# Patient Record
Sex: Male | Born: 1960 | Race: White | Hispanic: No | Marital: Married | Smoking: Former smoker
Health system: Southern US, Community
[De-identification: ages and names within clinical notes are randomized; demographics above are authoritative.]

## PROBLEM LIST (undated history)

## (undated) DIAGNOSIS — Z9889 Other specified postprocedural states: Secondary | ICD-10-CM

## (undated) DIAGNOSIS — I272 Pulmonary hypertension, unspecified: Secondary | ICD-10-CM

## (undated) DIAGNOSIS — I428 Other cardiomyopathies: Secondary | ICD-10-CM

## (undated) DIAGNOSIS — J302 Other seasonal allergic rhinitis: Secondary | ICD-10-CM

## (undated) DIAGNOSIS — F17200 Nicotine dependence, unspecified, uncomplicated: Secondary | ICD-10-CM

## (undated) DIAGNOSIS — I509 Heart failure, unspecified: Secondary | ICD-10-CM

## (undated) DIAGNOSIS — I10 Essential (primary) hypertension: Secondary | ICD-10-CM

## (undated) DIAGNOSIS — R0602 Shortness of breath: Secondary | ICD-10-CM

## (undated) HISTORY — DX: Other cardiomyopathies: I42.8

## (undated) HISTORY — DX: Other seasonal allergic rhinitis: J30.2

## (undated) HISTORY — DX: Shortness of breath: R06.02

## (undated) HISTORY — DX: Heart failure, unspecified: I50.9

## (undated) HISTORY — DX: Other specified postprocedural states: Z98.890

## (undated) HISTORY — DX: Pulmonary hypertension, unspecified: I27.20

## (undated) HISTORY — PX: OTHER SURGICAL HISTORY: SHX169

## (undated) HISTORY — DX: Essential (primary) hypertension: I10

## (undated) HISTORY — DX: Nicotine dependence, unspecified, uncomplicated: F17.200

---

## 2011-11-13 ENCOUNTER — Encounter: Payer: Self-pay | Admitting: Cardiology

## 2011-11-13 ENCOUNTER — Other Ambulatory Visit: Payer: Self-pay | Admitting: Cardiology

## 2011-11-13 DIAGNOSIS — I509 Heart failure, unspecified: Secondary | ICD-10-CM

## 2011-11-20 ENCOUNTER — Encounter: Payer: Self-pay | Admitting: Cardiology

## 2011-11-20 ENCOUNTER — Ambulatory Visit (INDEPENDENT_AMBULATORY_CARE_PROVIDER_SITE_OTHER): Payer: Self-pay | Admitting: Cardiology

## 2011-11-20 ENCOUNTER — Encounter: Payer: Self-pay | Admitting: *Deleted

## 2011-11-20 VITALS — BP 152/78 | HR 80 | Ht 70.0 in | Wt 189.0 lb

## 2011-11-20 DIAGNOSIS — I2789 Other specified pulmonary heart diseases: Secondary | ICD-10-CM

## 2011-11-20 DIAGNOSIS — I5023 Acute on chronic systolic (congestive) heart failure: Secondary | ICD-10-CM

## 2011-11-20 DIAGNOSIS — I519 Heart disease, unspecified: Secondary | ICD-10-CM

## 2011-11-20 DIAGNOSIS — S2691XA Contusion of heart, unspecified with or without hemopericardium, initial encounter: Secondary | ICD-10-CM

## 2011-11-20 DIAGNOSIS — I272 Pulmonary hypertension, unspecified: Secondary | ICD-10-CM

## 2011-11-20 MED ORDER — FUROSEMIDE 40 MG PO TABS: 40.0000 mg | ORAL_TABLET | Freq: Every day | ORAL | Status: DC

## 2011-11-20 MED ORDER — LISINOPRIL 10 MG PO TABS: 10.0000 mg | ORAL_TABLET | Freq: Every day | ORAL | Status: DC

## 2011-11-20 MED ORDER — SPIRONOLACTONE 25 MG PO TABS: 25.0000 mg | ORAL_TABLET | Freq: Every day | ORAL | Status: DC

## 2011-11-20 NOTE — Patient Instructions (Signed)
   Lasix 80mg  daily x 5 days, then 40mg  daily thereafter  Aldactone 25mg  daily  Lisinopril 10mg  daily Your physician recommends that you go to the [facility] lab for blood work next Wednesday, 5/22 for BMET & BNP.   Nurse visit for vitals next Thursday Lexiscan cardiolite stress test  Follow up in  1 month

## 2011-11-21 ENCOUNTER — Telehealth: Payer: Self-pay | Admitting: *Deleted

## 2011-11-21 ENCOUNTER — Other Ambulatory Visit: Payer: Self-pay | Admitting: Cardiology

## 2011-11-21 DIAGNOSIS — I5023 Acute on chronic systolic (congestive) heart failure: Secondary | ICD-10-CM

## 2011-11-21 NOTE — Telephone Encounter (Signed)
Redness in face after taking medications

## 2011-11-22 ENCOUNTER — Encounter: Payer: Self-pay | Admitting: Nurse Practitioner

## 2011-11-22 DIAGNOSIS — I5023 Acute on chronic systolic (congestive) heart failure: Secondary | ICD-10-CM

## 2011-11-22 NOTE — Telephone Encounter (Signed)
Left message to return call 

## 2011-11-22 NOTE — Telephone Encounter (Signed)
11/22/11 152/91   89 pulse  11/21/11  146/88  86 pulse  Patient came in with blood pressure

## 2011-11-27 ENCOUNTER — Encounter: Payer: Self-pay | Admitting: Nurse Practitioner

## 2011-11-27 NOTE — Telephone Encounter (Signed)
Spoke with patient on Monday evening.  Patient restarted the two medicines again & continues to have the redness, flushing in face after taking meds.  Advised to d/c one medication at a time to see if makes a difference as he did begin two different meds at the same time.  States he does have nurse visit this Thursday morning & will follow up then.

## 2011-11-28 ENCOUNTER — Ambulatory Visit (INDEPENDENT_AMBULATORY_CARE_PROVIDER_SITE_OTHER): Payer: Self-pay | Admitting: *Deleted

## 2011-11-28 ENCOUNTER — Encounter: Payer: Self-pay | Admitting: *Deleted

## 2011-11-28 VITALS — BP 144/74 | HR 73 | Ht 70.0 in | Wt 190.0 lb

## 2011-11-28 DIAGNOSIS — I10 Essential (primary) hypertension: Secondary | ICD-10-CM

## 2011-11-28 NOTE — Progress Notes (Signed)
Patient presents to office for nurse visit requested at last office visit after starting new medications for blood pressure elevation. Patient has taken all doses of medications. Denies dizziness, chest pain or shortness of breath. After starting meds, patient initially had some flushing of the face that has now cleared up and no longer experiencing.

## 2011-12-03 ENCOUNTER — Telehealth: Payer: Self-pay | Admitting: *Deleted

## 2011-12-03 DIAGNOSIS — I5023 Acute on chronic systolic (congestive) heart failure: Secondary | ICD-10-CM

## 2011-12-03 DIAGNOSIS — Z79899 Other long term (current) drug therapy: Secondary | ICD-10-CM

## 2011-12-03 NOTE — Telephone Encounter (Signed)
Patient wants to know if okay to stay at 80mg  of Lasix daily.  Initially had him taking 80mg  x 5 days, then back to 40mg  daily.  States when he dropped down to 40mg  daily, BP went back up & began to feel more winded again.  OV scheduled for 6/18.

## 2011-12-04 NOTE — Telephone Encounter (Signed)
The patient can continue on 80 mg of Lasix daily.  He should have an appointment soon in the office.  Also schedule a BMET couple of days prior to his office visit

## 2011-12-05 MED ORDER — FUROSEMIDE 80 MG PO TABS: 80.0000 mg | ORAL_TABLET | Freq: Every day | ORAL | Status: DC

## 2011-12-05 NOTE — Telephone Encounter (Signed)
Patient notified and verbalized understanding.  New rx sent to pharmacy for 80mg  tabs.  OV scheduled for 6/18.  Will fax order to [facility] lab - he will do prior to OV.

## 2011-12-07 DIAGNOSIS — Z9889 Other specified postprocedural states: Secondary | ICD-10-CM

## 2011-12-07 HISTORY — PX: CARDIAC CATHETERIZATION: SHX172

## 2011-12-07 HISTORY — DX: Other specified postprocedural states: Z98.890

## 2011-12-09 ENCOUNTER — Telehealth: Payer: Self-pay | Admitting: Physician Assistant

## 2011-12-09 NOTE — Telephone Encounter (Signed)
Patient called because of elevated blood pressures. He was recently seen in the office by Dr. Andee Lineman at which time his BP was in the 140's but he states since then he intermittently runs 180s systolic. Otherwise he feels fine, no CP, SOB, HA or visual changes. He was wondering if he should increase his medicines. I told him for tonight it is okay to go ahead and take an additional lisinopril, but instructed him to call back tomorrow when office is open to discuss plans to either a) permanently change to increased dose of lisinopril with sooner lab recheck or b) add additional antihypertensive agent. He doesn't see Dr. Andee Lineman until June 18th. As a general precaution I also advised if he begins to feel poorly with said elevated BP he is welcome to proceed to ER for evaluation although I do not feel he needs to at present given no current symptoms and plans to take extra lisinopril. He verbalized understanding and gratitude.  Zenovia Justman PA-C

## 2011-12-10 ENCOUNTER — Telehealth: Payer: Self-pay | Admitting: Cardiology

## 2011-12-10 NOTE — Telephone Encounter (Signed)
See below

## 2011-12-10 NOTE — Telephone Encounter (Signed)
Patient had to call last evening due to elevated Blood pressure. Please see telephone # from Bernette Mayers. PA. Patient is concerned about his blood pressure.

## 2011-12-11 NOTE — Telephone Encounter (Signed)
Discussed below with patient.  States he feels like he gets more SOB when blood pressure goes up.  He questions taking 20mg  Lisinopril instead of the 10mg  daily.  Is due for follow up 6/18.

## 2011-12-11 NOTE — Telephone Encounter (Signed)
Patient has called back.

## 2011-12-14 DIAGNOSIS — I519 Heart disease, unspecified: Secondary | ICD-10-CM | POA: Insufficient documentation

## 2011-12-14 DIAGNOSIS — I272 Pulmonary hypertension, unspecified: Secondary | ICD-10-CM | POA: Insufficient documentation

## 2011-12-14 DIAGNOSIS — I279 Pulmonary heart disease, unspecified: Secondary | ICD-10-CM | POA: Insufficient documentation

## 2011-12-14 DIAGNOSIS — S2691XA Contusion of heart, unspecified with or without hemopericardium, initial encounter: Secondary | ICD-10-CM | POA: Insufficient documentation
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
## 2011-12-14 NOTE — Assessment & Plan Note (Signed)
Likely secondary to pulmonary venous hypertension and will be reassessed after adequate medical treatment.

## 2011-12-14 NOTE — Assessment & Plan Note (Signed)
The patient will be referred for a Myoview to assess LV function and rule out ischemic heart disease.

## 2011-12-16 NOTE — Telephone Encounter (Signed)
Yes he can increase Lisinopril to 20mg  PO qdaily, but stop potassium/Kdur supplements because increasing ACEI as well as the fact he is taking spironolactone can cause hyperkalemai. Do BMET in 5 days. He also should be due for F/U ECHO because we suspected he had cardiac contusion - does he have f/u scheduled with me and ECHO pending before visit? And yes he needs to keep very close track of his BP.He can email those directly to me at [email or web address]. I think easier then you taking phone call on this.   Alvin Critchley [facility] 12/16/2011 6:10 AM

## 2011-12-18 MED ORDER — LISINOPRIL 10 MG PO TABS: 20.0000 mg | ORAL_TABLET | Freq: Every day | ORAL | Status: DC

## 2011-12-18 NOTE — Addendum Note (Signed)
Addended by: Lesle Chris on: 12/18/2011 04:39 PM   Modules accepted: Orders

## 2011-12-18 NOTE — Telephone Encounter (Signed)
Patient notified and verbalized understanding of below.  Also, notified of recent labs & stress test.  Has OV scheduled for 6/18 & GD will discuss further at that time.

## 2011-12-24 ENCOUNTER — Encounter: Payer: Self-pay | Admitting: *Deleted

## 2011-12-24 ENCOUNTER — Ambulatory Visit (INDEPENDENT_AMBULATORY_CARE_PROVIDER_SITE_OTHER): Payer: PRIVATE HEALTH INSURANCE | Admitting: Cardiology

## 2011-12-24 ENCOUNTER — Encounter: Payer: Self-pay | Admitting: Cardiology

## 2011-12-24 ENCOUNTER — Telehealth: Payer: Self-pay

## 2011-12-24 VITALS — BP 143/82 | HR 74 | Ht 70.0 in | Wt 192.8 lb

## 2011-12-24 DIAGNOSIS — I2789 Other specified pulmonary heart diseases: Secondary | ICD-10-CM

## 2011-12-24 DIAGNOSIS — I272 Pulmonary hypertension, unspecified: Secondary | ICD-10-CM

## 2011-12-24 DIAGNOSIS — I519 Heart disease, unspecified: Secondary | ICD-10-CM

## 2011-12-24 DIAGNOSIS — S2691XA Contusion of heart, unspecified with or without hemopericardium, initial encounter: Secondary | ICD-10-CM

## 2011-12-24 DIAGNOSIS — R0602 Shortness of breath: Secondary | ICD-10-CM

## 2011-12-24 DIAGNOSIS — Z01810 Encounter for preprocedural cardiovascular examination: Secondary | ICD-10-CM

## 2011-12-24 DIAGNOSIS — I5023 Acute on chronic systolic (congestive) heart failure: Secondary | ICD-10-CM

## 2011-12-24 MED ORDER — CARVEDILOL 3.125 MG PO TABS: 3.1250 mg | ORAL_TABLET | Freq: Two times a day (BID) | ORAL | Status: DC

## 2011-12-24 NOTE — Assessment & Plan Note (Signed)
We'll further evaluate this with a right heart catheterization. I suspect this is related to pulmonary venous hypertension.
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
## 2011-12-24 NOTE — Patient Instructions (Signed)
   Begin Coreg 3.125mg  twice a day    Left & right heart cath - see info sheet given Follow up will be given at time of discharge

## 2011-12-24 NOTE — Telephone Encounter (Signed)
Left & right heart cath - scheduled for 6/25 - 10:30 - stuckey
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
## 2011-12-24 NOTE — Assessment & Plan Note (Addendum)
Patient was started previously on Lasix as well as an ACE inhibitor and spironolactone. The patient a couple of occasions tried to lower his dose of Lasix but then becomes rapidly more short of breath. He now takes Lasix 80 mg a day. Nevertheless he is in NYHA class III and her shortness of breath on minimal exertion. He also has reported some intermittent substernal chest pains although somewhat atypical but nevertheless sometimes occurring on exertion. Patient has very little clinical improvement on medications. I also started Coreg 3.125 mg by mouth twice a day. 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 He will also need an evaluation by the EP service for possible future ICD. I suggest that the patient be admitted after his heart catheterization unless his filling pressures were totally normal. Then we can consider elective milrinone an outpatient evaluation for ICD Further up titration of beta blocker as tolerated.

## 2011-12-26 ENCOUNTER — Telehealth: Payer: Self-pay | Admitting: *Deleted

## 2011-12-26 DIAGNOSIS — R7309 Other abnormal glucose: Secondary | ICD-10-CM

## 2011-12-26 LAB — PROTIME-INR

## 2011-12-26 NOTE — Telephone Encounter (Signed)
Notes Recorded by Lesle Chris, LPN on 5/62/1308 at 12:21 PM Left message to return call.

## 2011-12-26 NOTE — Telephone Encounter (Signed)
Notes Recorded by Lesle Chris, LPN on 10/14/8117 at 12:29 PM Wife Corrie Dandy) notified of below. He will be doing labs this afternoon & will have him pick up order.

## 2011-12-26 NOTE — Telephone Encounter (Signed)
Message copied by Lesle Chris on Thu Dec 26, 2011 12:22 PM ------      Message from: Learta Codding      Created: Tue Dec 24, 2011  9:08 PM       Patient reported neuropathic symptoms, bloodwork cw diabetes.new diagnosis.draw hba1c with cath lab bloodwork

## 2011-12-31 ENCOUNTER — Inpatient Hospital Stay (HOSPITAL_BASED_OUTPATIENT_CLINIC_OR_DEPARTMENT_OTHER)
Admission: RE | Admit: 2011-12-31 | Discharge: 2011-12-31 | Disposition: A | Discharge status: Home / Self Care | Payer: PRIVATE HEALTH INSURANCE | Source: Ambulatory Visit | Attending: Cardiology | Admitting: Cardiology

## 2011-12-31 ENCOUNTER — Encounter (HOSPITAL_BASED_OUTPATIENT_CLINIC_OR_DEPARTMENT_OTHER)
Admission: RE | Disposition: A | Discharge status: Home / Self Care | Payer: Self-pay | Source: Ambulatory Visit | Attending: Cardiology

## 2011-12-31 DIAGNOSIS — I509 Heart failure, unspecified: Secondary | ICD-10-CM | POA: Insufficient documentation

## 2011-12-31 DIAGNOSIS — I10 Essential (primary) hypertension: Secondary | ICD-10-CM | POA: Insufficient documentation

## 2011-12-31 DIAGNOSIS — Z79899 Other long term (current) drug therapy: Secondary | ICD-10-CM | POA: Insufficient documentation

## 2011-12-31 DIAGNOSIS — I428 Other cardiomyopathies: Secondary | ICD-10-CM | POA: Insufficient documentation

## 2011-12-31 DIAGNOSIS — R079 Chest pain, unspecified: Secondary | ICD-10-CM | POA: Insufficient documentation

## 2011-12-31 DIAGNOSIS — I2510 Atherosclerotic heart disease of native coronary artery without angina pectoris: Secondary | ICD-10-CM

## 2011-12-31 DIAGNOSIS — F17200 Nicotine dependence, unspecified, uncomplicated: Secondary | ICD-10-CM | POA: Insufficient documentation

## 2011-12-31 LAB — POCT I-STAT 3, ART BLOOD GAS (G3+)
Acid-base deficit: 1 mmol/L (ref 0.0–2.0)
Bicarbonate: 23.4 mEq/L (ref 20.0–24.0)
TCO2: 25 mmol/L (ref 0–100)
pO2, Arterial: 66 mmHg — ABNORMAL LOW (ref 80.0–100.0)

## 2011-12-31 LAB — POCT I-STAT 3, VENOUS BLOOD GAS (G3P V)
O2 Saturation: 67 %
TCO2: 25 mmol/L (ref 0–100)
pCO2, Ven: 39.6 mmHg — ABNORMAL LOW (ref 45.0–50.0)
pH, Ven: 7.382 — ABNORMAL HIGH (ref 7.250–7.300)
pO2, Ven: 35 mmHg (ref 30.0–45.0)

## 2011-12-31 SURGERY — JV LEFT AND RIGHT HEART CATHETERIZATION WITH CORONARY ANGIOGRAM
Anesthesia: Moderate Sedation

## 2011-12-31 MED ORDER — ONDANSETRON HCL 4 MG/2ML IJ SOLN: 4.0000 mg | Freq: Four times a day (QID) | INTRAMUSCULAR | Status: DC | PRN

## 2011-12-31 MED ORDER — SODIUM CHLORIDE 0.9 % IV SOLN
INTRAVENOUS | Status: DC
  Administered 2011-12-31: 10:00:00 via INTRAVENOUS

## 2011-12-31 MED ORDER — SODIUM CHLORIDE 0.9 % IV SOLN: INTRAVENOUS | Status: DC

## 2011-12-31 MED ORDER — ACETAMINOPHEN 325 MG PO TABS: 650.0000 mg | ORAL_TABLET | ORAL | Status: DC | PRN

## 2011-12-31 NOTE — Interval H&P Note (Signed)
History and Physical Interval Note:  12/31/2011 12:31 PM  Henry Bryant  has presented today for surgery, with the diagnosis of sob  The various methods of treatment have been discussed with the patient and family. After consideration of risks, benefits and other options for treatment, the patient has consented to  Procedure(s) (LRB): JV LEFT AND RIGHT HEART CATHETERIZATION WITH CORONARY ANGIOGRAM (N/A) as a surgical intervention .  The patient's history has been reviewed, patient examined, no change in status, stable for surgery.  I have reviewed the patients' chart and labs.  Questions were answered to the patient's satisfaction.     Rollene Rotunda

## 2011-12-31 NOTE — CV Procedure (Addendum)
  Cardiac Catheterization Procedure Note  Name: Henry Bryant MRN: [id number] DOB: 06-20-1961  Procedure: Right Heart Cath, Left Heart Cath, Selective Coronary Angiography, LV angiography  Indication:  Cardiomyopathy and chest pain.  Procedural Details: The right groin was prepped, draped, and anesthetized with 1% lidocaine. Using the modified Seldinger technique a 4 French sheath was placed in the right femoral artery and a 7 French sheath was placed in the right femoral vein. A Swan-Ganz catheter was used for the right heart catheterization. Standard protocol was followed for recording of right heart pressures and sampling of oxygen saturations. Fick cardiac output was calculated. Standard Judkins catheters were used for selective coronary angiography and left ventriculography. There were no immediate procedural complications. The patient was transferred to the post catheterization recovery area for further monitoring.  Procedural Findings:  Hemodynamics:               RA 3    RV 35/2    PA 30/12  21    PCWP 10    LV 151/19    AO 151/76   Oxygen saturations:    PA 67    AO 93   Cardiac Output (Fick) 4.5                               Cardiac Index (Fick) 2.2   Coronary angiography:  Coronary dominance: right  Left mainstem: Short (separate ostia)  Left anterior descending (LAD): Long diffuse 25 - 30% proximal stenosis.  Focal proximal 30% with calcification.  Mid 50% stenosis at the take of of the mid diagonal.  D1 moderate to large size with mild diffuse luminal irregularities.  D2 large normal  Left circumflex (LCx): AV groove mild diffuse luminal irregularities.  OM1 small and normal.  OM2 Large size with ostial 50% stenosis.  PL moderate sized and normal.      Right coronary artery (RCA): Large.  Proximal 25%.  Mild diffuse luminal irregularities.  PDA moderate sized and normal.  PL moderate sized and normal.  Left ventriculography: Left ventricular systolic function is  normal, LVEF is estimated at 35%, there is no significant mitral regurgitation   Final Conclusions:  Mild to moderate coronary plaque.  Moderately reduced EF with global hypokinesis.  Nonischemic cardiomyopathy.  Right heart pressures normal.  Recommendations: Continue medical management for dilated cardiomyopathy.   Rollene Rotunda 12/31/2011, 12:15 PM

## 2011-12-31 NOTE — Progress Notes (Signed)
Bedrest begins @ 1230.  Tegaderm dressing applied to right groin site by Venda Rodes.  Dr. Antoine Poche in to discuss results with patient and family.
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
## 2012-01-02 ENCOUNTER — Other Ambulatory Visit: Payer: Self-pay | Admitting: Cardiology
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
## 2012-01-02 NOTE — Telephone Encounter (Signed)
Received call from patient wife, states patient has had two bad nights, she is requesting that the medications be refilled through [facility], they can assist with costs.  Patient was not taking Coreg, they have not been able to have filled due to finanacial reasons.

## 2012-01-10 ENCOUNTER — Encounter: Payer: PRIVATE HEALTH INSURANCE | Admitting: Cardiology

## 2012-01-10 MED ORDER — LISINOPRIL 10 MG PO TABS: 20.0000 mg | ORAL_TABLET | Freq: Every day | ORAL | Status: DC

## 2012-01-10 MED ORDER — CARVEDILOL 3.125 MG PO TABS: 3.1250 mg | ORAL_TABLET | Freq: Two times a day (BID) | ORAL | Status: DC

## 2012-01-14 ENCOUNTER — Ambulatory Visit (INDEPENDENT_AMBULATORY_CARE_PROVIDER_SITE_OTHER): Payer: PRIVATE HEALTH INSURANCE | Admitting: Nurse Practitioner

## 2012-01-14 ENCOUNTER — Encounter: Payer: Self-pay | Admitting: Nurse Practitioner

## 2012-01-14 VITALS — BP 200/100 | HR 77 | Ht 70.0 in | Wt 194.2 lb

## 2012-01-14 DIAGNOSIS — R0989 Other specified symptoms and signs involving the circulatory and respiratory systems: Secondary | ICD-10-CM

## 2012-01-14 DIAGNOSIS — R0600 Dyspnea, unspecified: Secondary | ICD-10-CM

## 2012-01-14 DIAGNOSIS — I519 Heart disease, unspecified: Secondary | ICD-10-CM

## 2012-01-14 DIAGNOSIS — R0609 Other forms of dyspnea: Secondary | ICD-10-CM

## 2012-01-14 LAB — BASIC METABOLIC PANEL
BUN: 16 mg/dL (ref 6–23)
CO2: 26 mEq/L (ref 19–32)
Calcium: 8.9 mg/dL (ref 8.4–10.5)
Chloride: 101 mEq/L (ref 96–112)
Creatinine, Ser: 0.9 mg/dL (ref 0.4–1.5)
GFR: 89.14 mL/min (ref >60.00)
Glucose, Bld: 135 mg/dL — ABNORMAL HIGH (ref 70–99)
Potassium: 3.5 mEq/L (ref 3.5–5.1)
Sodium: 137 mEq/L (ref 135–145)

## 2012-01-14 LAB — CBC WITH DIFFERENTIAL/PLATELET
Basophils Absolute: 0 10*3/uL (ref 0.0–0.1)
Basophils Relative: 0.6 % (ref 0.0–3.0)
Eosinophils Absolute: 0.1 10*3/uL (ref 0.0–0.7)
Eosinophils Relative: 2.1 % (ref 0.0–5.0)
HCT: 46.9 % (ref 39.0–52.0)
Hemoglobin: 16 g/dL (ref 13.0–17.0)
Lymphocytes Relative: 19.7 % (ref 12.0–46.0)
Lymphs Abs: 1.2 10*3/uL (ref 0.7–4.0)
MCHC: 34.3 g/dL (ref 30.0–36.0)
MCV: 89.4 fl (ref 78.0–100.0)
Monocytes Absolute: 0.4 10*3/uL (ref 0.1–1.0)
Monocytes Relative: 7.5 % (ref 3.0–12.0)
Neutro Abs: 4.1 10*3/uL (ref 1.4–7.7)
Neutrophils Relative %: 70.1 % (ref 43.0–77.0)
Platelets: 253 10*3/uL (ref 150.0–400.0)
RBC: 5.24 Mil/uL (ref 4.22–5.81)
RDW: 14.1 % (ref 11.5–14.6)
WBC: 5.9 10*3/uL (ref 4.5–10.5)

## 2012-01-14 LAB — BRAIN NATRIURETIC PEPTIDE: Pro B Natriuretic peptide (BNP): 437 pg/mL — ABNORMAL HIGH (ref 0.0–100.0)

## 2012-01-14 MED ORDER — CARVEDILOL 6.25 MG PO TABS: 6.2500 mg | ORAL_TABLET | Freq: Two times a day (BID) | ORAL | Status: DC
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
## 2012-01-14 NOTE — Patient Instructions (Addendum)
We need to check labs today  We are increasing your Coreg to 6.25 mg two times a day for the next week. I have sent this to the drug store.   I will see you in a week  Watch your salt  Try to stop smoking  We are going to refer you to Dr. Cleone Slim for a hematology referral. His wife sees him as well.   Call the [facility] office at [phone] if you have any questions, problems or concerns.

## 2012-01-14 NOTE — Assessment & Plan Note (Signed)
He is felt to have a nonischemic CM with an EF of 35%. He does have mild to moderate CAD. His right heart pressures are normal. Blood pressure is quite elevated but he has had no medicines yet today. BP usually in the 160's at home. I have discussed his case with Dr. Antoine Poche. We will assume his care in Dr. Margarita Mail absence.  We are going to increase the Coreg to 6.25 mg BID. Continue the other medicines. Check BMET and BNP today. He does not really look to be volume overloaded today but will see what the labs look like. He is strongly advised to stop smoking. He has had an abnormal protein electrophoresis and will need hematology referral. Will try to arrange this with Dr. Cleone Slim - his wife currently sees him. Dr. Antoine Poche and I will see him in a week. He is going to need up titration of his medicines with possible ICD implant if we do not have improvement after optimal medicines are on board. Blood pressure management will be key. Salt restriction is also advised today. Patient is agreeable to this plan and will call if any problems develop in the interim.

## 2012-01-15 ENCOUNTER — Telehealth: Payer: Self-pay | Admitting: Cardiology

## 2012-01-15 MED ORDER — HYDRALAZINE HCL 25 MG PO TABS: 25.0000 mg | ORAL_TABLET | Freq: Three times a day (TID) | ORAL | Status: DC

## 2012-01-15 NOTE — Telephone Encounter (Signed)
New Problem:    Patient's wife called because his BP is 190/104 and she is concerned.  Please call back.

## 2012-01-15 NOTE — Telephone Encounter (Signed)
Discussed with Lawson Fiscal and will start him on Hydralazine 25mg  tid  Patients wife aware and medication called in to pharmacy

## 2012-01-21 ENCOUNTER — Ambulatory Visit (INDEPENDENT_AMBULATORY_CARE_PROVIDER_SITE_OTHER): Payer: PRIVATE HEALTH INSURANCE | Admitting: Nurse Practitioner

## 2012-01-21 ENCOUNTER — Encounter: Payer: Self-pay | Admitting: Nurse Practitioner

## 2012-01-21 VITALS — BP 162/88 | HR 68 | Ht 70.0 in | Wt 198.6 lb

## 2012-01-21 DIAGNOSIS — R0600 Dyspnea, unspecified: Secondary | ICD-10-CM

## 2012-01-21 DIAGNOSIS — I519 Heart disease, unspecified: Secondary | ICD-10-CM

## 2012-01-21 DIAGNOSIS — R0609 Other forms of dyspnea: Secondary | ICD-10-CM

## 2012-01-21 MED ORDER — BISOPROLOL FUMARATE 5 MG PO TABS: 5.0000 mg | ORAL_TABLET | Freq: Every day | ORAL | Status: DC

## 2012-01-21 MED ORDER — HYDRALAZINE HCL 50 MG PO TABS: 50.0000 mg | ORAL_TABLET | Freq: Three times a day (TID) | ORAL | Status: DC

## 2012-01-21 MED ORDER — LISINOPRIL 20 MG PO TABS: 20.0000 mg | ORAL_TABLET | Freq: Every day | ORAL | Status: DC

## 2012-01-21 MED ORDER — ISOSORBIDE MONONITRATE ER 60 MG PO TB24: 60.0000 mg | ORAL_TABLET | ORAL | Status: DC

## 2012-01-21 NOTE — Patient Instructions (Signed)
We are going to arrange for a breathing test (PFTs) at [facility]  Stop the Coreg  Start Bisoprolol 5 mg daily  Start Imdur 60 mg daily - this might cause a headache  Increase the Hydralazine to 50 mg three times a day  I refilled your Lisinopril  See Dr. Antoine Poche in [place] or [place] in 3 weeks.  Call the [facility] office at [phone] if you have any questions, problems or concerns.

## 2012-01-21 NOTE — Assessment & Plan Note (Signed)
I have talked with Dr. Antoine Poche. We are going to stop the Coreg and switch to Bisoprolol 5 mg and add Imdur 60 mg daily. We will check PFTs as well. His Hydralazine is increased to 50 mg TID. Dr. Antoine Poche will see him back in about 3 weeks up in [place] or [place]. At some point, he will need a repeat echo and possible referral for ICD. Smoking cessation is encouraged as well. I have also asked the schedulers to send a referral to see Dr. Cleone Slim for his abnormal protein electrophoresis. Patient and wife are agreeable to this plan and will call if any problems develop in the interim.
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
## 2012-01-22 ENCOUNTER — Ambulatory Visit (HOSPITAL_COMMUNITY)
Admission: RE | Admit: 2012-01-22 | Discharge: 2012-01-22 | Disposition: A | Discharge status: Home / Self Care | Payer: Self-pay | Source: Ambulatory Visit | Attending: Nurse Practitioner | Admitting: Nurse Practitioner

## 2012-01-22 ENCOUNTER — Ambulatory Visit: Payer: PRIVATE HEALTH INSURANCE | Admitting: Nurse Practitioner

## 2012-01-22 DIAGNOSIS — R0600 Dyspnea, unspecified: Secondary | ICD-10-CM

## 2012-01-22 DIAGNOSIS — R0602 Shortness of breath: Secondary | ICD-10-CM | POA: Insufficient documentation

## 2012-01-22 MED ORDER — ALBUTEROL SULFATE (5 MG/ML) 0.5% IN NEBU
2.5000 mg | INHALATION_SOLUTION | Freq: Once | RESPIRATORY_TRACT | Status: AC
  Administered 2012-01-22: 2.5 mg via RESPIRATORY_TRACT

## 2012-01-22 NOTE — Procedures (Signed)
[employer]              ACCOUNT NO.:  [id number]  MEDICAL RECORD NO.:  [id number]  LOCATION:  RESP                          FACILITY:  [facility]  PHYSICIAN:  Helmut Hennon L. Juanetta Gosling, M.D.DATE OF BIRTH:  [birth year]  DATE OF PROCEDURE: DATE OF DISCHARGE:                           PULMONARY FUNCTION TEST   Reason for pulmonary function testing is shortness of breath.  1. Spirometry shows a severe ventilatory defect with evidence of     airflow obstruction. 2. Lung volumes show borderline air trapping. 3. DLCO is moderately reduced. 4. Airway resistance is somewhat elevated confirming the presence of     airflow obstruction. 5. There is significant bronchodilator improvement. 6. Considering the patient's smoking history, this study is consistent     with COPD.     Sylvestre Rathgeber L. Juanetta Gosling, M.D.     ELH/MEDQ  D:  01/22/2012  T:  01/22/2012  Job:  [id number]  cc:   Rollene Rotunda, MD, [facility]

## 2012-01-31 ENCOUNTER — Encounter: Payer: PRIVATE HEALTH INSURANCE | Admitting: Internal Medicine

## 2012-01-31 DIAGNOSIS — D472 Monoclonal gammopathy: Secondary | ICD-10-CM

## 2012-01-31 DIAGNOSIS — I428 Other cardiomyopathies: Secondary | ICD-10-CM

## 2012-01-31 DIAGNOSIS — I509 Heart failure, unspecified: Secondary | ICD-10-CM

## 2012-02-03 LAB — PULMONARY FUNCTION TEST

## 2012-02-04 ENCOUNTER — Telehealth: Payer: Self-pay | Admitting: *Deleted

## 2012-02-04 NOTE — Telephone Encounter (Signed)
Returned patients call and got voicemail again.  LMTCB.  Vista Mink, CMA

## 2012-02-04 NOTE — Telephone Encounter (Signed)
Message copied by Awilda Bill on Tue Feb 04, 2012  2:50 PM ------      Message from: Rosalio Macadamia      Created: Mon Feb 03, 2012 12:27 PM       Please report. PFT's are abnormal with emphysema noted. Needs pulmonary consult soon. Needs to work on smoking cessation.

## 2012-02-04 NOTE — Telephone Encounter (Signed)
Fu call Pt returning your call cell phone number correct per pt

## 2012-02-04 NOTE — Telephone Encounter (Signed)
Notes Recorded by Awilda Bill, CMA on 02/04/2012 at 3:37 PM Patient aware of results. Has not quit smoking, will not try the patch or other smoking cessation due to cost. Pt states he cannot afford his medication and would like samples. Told pt I would put in referral to Pulmonary and check for samples. Vista Mink, CMA

## 2012-02-04 NOTE — Telephone Encounter (Signed)
Called patient re PFTs, home # is actually cell number, disconnected.  Secondary # LMOM to call back.  Vista Mink, CMA

## 2012-02-05 ENCOUNTER — Other Ambulatory Visit: Payer: Self-pay | Admitting: Nurse Practitioner

## 2012-02-05 DIAGNOSIS — R942 Abnormal results of pulmonary function studies: Secondary | ICD-10-CM

## 2012-02-10 ENCOUNTER — Other Ambulatory Visit: Payer: Self-pay | Admitting: Cardiology

## 2012-02-10 ENCOUNTER — Other Ambulatory Visit: Payer: Self-pay | Admitting: Nurse Practitioner

## 2012-02-10 MED ORDER — BISOPROLOL FUMARATE 5 MG PO TABS: 5.0000 mg | ORAL_TABLET | Freq: Every day | ORAL | Status: DC

## 2012-02-10 MED ORDER — HYDRALAZINE HCL 50 MG PO TABS: 50.0000 mg | ORAL_TABLET | Freq: Three times a day (TID) | ORAL | Status: DC

## 2012-02-10 MED ORDER — ISOSORBIDE MONONITRATE ER 60 MG PO TB24: 60.0000 mg | ORAL_TABLET | ORAL | Status: DC

## 2012-02-10 MED ORDER — LISINOPRIL 20 MG PO TABS: 20.0000 mg | ORAL_TABLET | Freq: Every day | ORAL | Status: DC

## 2012-02-10 MED ORDER — FUROSEMIDE 40 MG PO TABS: 80.0000 mg | ORAL_TABLET | Freq: Every day | ORAL | Status: DC

## 2012-02-10 NOTE — Telephone Encounter (Signed)
Pt is out of pills 

## 2012-02-10 NOTE — Telephone Encounter (Signed)
Refill- pt needs Rx sent to [facility]

## 2012-02-12 ENCOUNTER — Telehealth: Payer: Self-pay | Admitting: Cardiology

## 2012-02-12 ENCOUNTER — Encounter: Payer: Self-pay | Admitting: Cardiology

## 2012-02-12 ENCOUNTER — Ambulatory Visit (INDEPENDENT_AMBULATORY_CARE_PROVIDER_SITE_OTHER): Payer: Self-pay | Admitting: Cardiology

## 2012-02-12 VITALS — BP 150/70 | HR 62 | Ht 70.0 in | Wt 197.0 lb

## 2012-02-12 DIAGNOSIS — S2691XA Contusion of heart, unspecified with or without hemopericardium, initial encounter: Secondary | ICD-10-CM

## 2012-02-12 DIAGNOSIS — I272 Pulmonary hypertension, unspecified: Secondary | ICD-10-CM

## 2012-02-12 DIAGNOSIS — I2789 Other specified pulmonary heart diseases: Secondary | ICD-10-CM

## 2012-02-12 DIAGNOSIS — I519 Heart disease, unspecified: Secondary | ICD-10-CM

## 2012-02-12 MED ORDER — LABETALOL HCL 100 MG PO TABS: 100.0000 mg | ORAL_TABLET | Freq: Three times a day (TID) | ORAL | Status: DC

## 2012-02-12 MED ORDER — LABETALOL HCL 100 MG PO TABS: 100.0000 mg | ORAL_TABLET | Freq: Two times a day (BID) | ORAL | Status: DC

## 2012-02-12 MED ORDER — ISOSORBIDE MONONITRATE ER 120 MG PO TB24: 120.0000 mg | ORAL_TABLET | Freq: Every day | ORAL | Status: DC
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
## 2012-02-12 NOTE — Telephone Encounter (Signed)
SPoke with [place] at pharmacy and reviewed medications - she was not aware that pts carvedilol and bisoprolol was stopped.

## 2012-02-12 NOTE — Patient Instructions (Addendum)
Please increase your Labetalol to 100 mg three times a day Increase your Isosorbide to 120 mg a day Continue all other medications as listed  Follow up with Dr Earnestine Leys.

## 2012-02-12 NOTE — Telephone Encounter (Signed)
New Problem:    Called in with questions about the patient's Lebedilol.  Please call back.

## 2012-02-13 ENCOUNTER — Ambulatory Visit (INDEPENDENT_AMBULATORY_CARE_PROVIDER_SITE_OTHER): Payer: Self-pay | Admitting: Internal Medicine

## 2012-02-13 ENCOUNTER — Ambulatory Visit (INDEPENDENT_AMBULATORY_CARE_PROVIDER_SITE_OTHER)
Admission: RE | Admit: 2012-02-13 | Discharge: 2012-02-13 | Disposition: A | Discharge status: Home / Self Care | Payer: Self-pay | Source: Ambulatory Visit | Attending: Internal Medicine | Admitting: Internal Medicine

## 2012-02-13 ENCOUNTER — Encounter: Payer: Self-pay | Admitting: Internal Medicine

## 2012-02-13 VITALS — BP 128/88 | HR 62 | Temp 97.8°F | Ht 70.0 in | Wt 197.2 lb

## 2012-02-13 DIAGNOSIS — J449 Chronic obstructive pulmonary disease, unspecified: Secondary | ICD-10-CM

## 2012-02-13 DIAGNOSIS — I519 Heart disease, unspecified: Secondary | ICD-10-CM

## 2012-02-13 DIAGNOSIS — I272 Pulmonary hypertension, unspecified: Secondary | ICD-10-CM

## 2012-02-13 DIAGNOSIS — I2789 Other specified pulmonary heart diseases: Secondary | ICD-10-CM
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
## 2012-02-13 NOTE — Patient Instructions (Addendum)
Stop labetolol and start bystolic 20 mg daily  Stop prinivil and start benicar 40/25 one half daily  Please remember to go to the  x-ray department downstairs for your tests - we will call you with the results when they are available.  If not convinced you are better with breathing need to return here to consider adding pulmonary medications  The key is to stop smoking completely before smoking completely stops you- this is much more important than anything we can add to your care
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
## 2012-02-14 NOTE — Assessment & Plan Note (Signed)
Echo 11/13/11 c/w mild RV systolic dysfunction and mod pah with RVSystolic of 54  He may have a component of copd/cor pulmonale but note LAE >>  RAE  So I would simply rec repeat echo p optimal rx of L Ht failure and if still worrisome PAH return to pulmonary for further w/u.

## 2012-02-14 NOTE — Progress Notes (Signed)
Quick Note:  Spoke with pt and notified of results per Dr. Wert. Pt verbalized understanding and denied any questions.  ______ 

## 2012-02-18 ENCOUNTER — Ambulatory Visit (INDEPENDENT_AMBULATORY_CARE_PROVIDER_SITE_OTHER): Payer: Self-pay | Admitting: Cardiology

## 2012-02-18 VITALS — BP 170/64 | HR 62 | Ht 70.0 in | Wt 197.5 lb

## 2012-02-18 DIAGNOSIS — I272 Pulmonary hypertension, unspecified: Secondary | ICD-10-CM

## 2012-02-18 DIAGNOSIS — S2691XA Contusion of heart, unspecified with or without hemopericardium, initial encounter: Secondary | ICD-10-CM

## 2012-02-18 DIAGNOSIS — I519 Heart disease, unspecified: Secondary | ICD-10-CM

## 2012-02-18 DIAGNOSIS — I2789 Other specified pulmonary heart diseases: Secondary | ICD-10-CM

## 2012-02-18 DIAGNOSIS — J449 Chronic obstructive pulmonary disease, unspecified: Secondary | ICD-10-CM

## 2012-02-18 MED ORDER — OLMESARTAN MEDOXOMIL-HCTZ 40-25 MG PO TABS: 1.0000 | ORAL_TABLET | Freq: Every day | ORAL | Status: DC

## 2012-02-18 MED ORDER — PANTOPRAZOLE SODIUM 40 MG PO TBEC: 40.0000 mg | DELAYED_RELEASE_TABLET | Freq: Every day | ORAL | Status: DC

## 2012-02-18 NOTE — Assessment & Plan Note (Signed)
Unclear contributing to his current cardiomyopathy may well be related
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
## 2012-02-18 NOTE — Assessment & Plan Note (Addendum)
Medications changed by Dr. Wyn Quaker and labetalol was discontinued also ACE inhibitor was stopped and changed to an ARB with hydrochlorothiazide. Suspect the patient may also large part of cough related to significant reflux disease and I added Protonix 40 minutes by mouth daily to his medical regimen

## 2012-02-18 NOTE — Patient Instructions (Addendum)
Your physician recommends that you schedule a follow-up appointment in: 3 months with Dr. Andee Lineman. Your physician has recommended you make the following change in your medication: Increase benicar/hct to 40/25 mg 1 tablet daily. Added Protonix 40 mg daily. Your new prescriptions have been sent to your pharmacy.
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
## 2012-02-18 NOTE — Assessment & Plan Note (Signed)
Likely pulmonary venous hypertension contributing to shortness of breath.

## 2012-02-18 NOTE — Assessment & Plan Note (Signed)
Multiple medication changes. The patient is hypertensive and I increased his combination of ARB with hydrochlorothiazide to 40 mg/25 mg daily. We will repeat an echo in 3 months and see if he has any improvement in ejection fraction.

## 2012-02-21 ENCOUNTER — Telehealth: Payer: Self-pay | Admitting: *Deleted

## 2012-02-21 NOTE — Telephone Encounter (Signed)
Spoke with patient and advised him that medications changes for blood pressure takes about 1 week to make a difference and that he should continue to monitor blood pressures at home. Patient advised to call our office back next week with further readings. Please advise if further recommendations are needed.

## 2012-02-24 NOTE — Telephone Encounter (Signed)
Pt seen in office on 02/18/12, by Dr Andee Lineman. I will defer to him for further med adjustments.

## 2012-02-26 NOTE — Telephone Encounter (Signed)
Add amlodipine 5mg  Po qdaily- patient needs to spread out medications as much as possible. Please check with [place] office if they still have ambulatory BP monitor- he would be good candidate, if not have his BP calibrated against ours and patient should take BP 3 times a day at various times for a week and document for me to review.

## 2012-02-27 NOTE — Telephone Encounter (Signed)
Left message for patient to call office.  

## 2012-02-28 MED ORDER — AMLODIPINE BESYLATE 5 MG PO TABS: 5.0000 mg | ORAL_TABLET | Freq: Every day | ORAL | Status: DC

## 2012-02-28 NOTE — Telephone Encounter (Signed)
Patient informed and wants to come in office and have his monitor check with office monitor. New prescription sent to [facility] per patient requests. Patient scheduled for nurse visit next Friday.

## 2012-03-11 ENCOUNTER — Encounter: Payer: Self-pay | Admitting: *Deleted

## 2012-03-11 ENCOUNTER — Ambulatory Visit (INDEPENDENT_AMBULATORY_CARE_PROVIDER_SITE_OTHER): Payer: Self-pay | Admitting: *Deleted

## 2012-03-11 VITALS — BP 198/97 | HR 73

## 2012-03-11 DIAGNOSIS — R0989 Other specified symptoms and signs involving the circulatory and respiratory systems: Secondary | ICD-10-CM

## 2012-03-11 DIAGNOSIS — I272 Pulmonary hypertension, unspecified: Secondary | ICD-10-CM

## 2012-03-18 ENCOUNTER — Other Ambulatory Visit: Payer: Self-pay | Admitting: *Deleted

## 2012-03-18 MED ORDER — OLMESARTAN MEDOXOMIL-HCTZ 40-25 MG PO TABS: 1.0000 | ORAL_TABLET | Freq: Every day | ORAL | Status: DC

## 2012-03-18 MED ORDER — HYDRALAZINE HCL 50 MG PO TABS: 50.0000 mg | ORAL_TABLET | Freq: Three times a day (TID) | ORAL | Status: AC

## 2012-03-18 MED ORDER — NEBIVOLOL HCL 20 MG PO TABS: 20.0000 mg | ORAL_TABLET | Freq: Every day | ORAL | Status: DC

## 2012-03-18 MED ORDER — ISOSORBIDE MONONITRATE ER 120 MG PO TB24: 120.0000 mg | ORAL_TABLET | Freq: Every day | ORAL | Status: DC

## 2012-03-18 MED ORDER — FUROSEMIDE 40 MG PO TABS: 80.0000 mg | ORAL_TABLET | Freq: Every day | ORAL | Status: DC

## 2012-03-18 MED ORDER — PANTOPRAZOLE SODIUM 40 MG PO TBEC: 40.0000 mg | DELAYED_RELEASE_TABLET | Freq: Every day | ORAL | Status: DC

## 2012-03-18 MED ORDER — AMLODIPINE BESYLATE 5 MG PO TABS: 5.0000 mg | ORAL_TABLET | Freq: Every day | ORAL | Status: DC

## 2012-03-20 DIAGNOSIS — I10 Essential (primary) hypertension: Secondary | ICD-10-CM

## 2012-03-24 ENCOUNTER — Encounter: Payer: Self-pay | Admitting: Cardiology

## 2012-03-24 ENCOUNTER — Telehealth: Payer: Self-pay | Admitting: Internal Medicine

## 2012-03-24 MED ORDER — NEBIVOLOL HCL 20 MG PO TABS: 20.0000 mg | ORAL_TABLET | Freq: Every day | ORAL | Status: DC

## 2012-03-24 NOTE — Telephone Encounter (Signed)
I spoke with pt and stated he only needed his bystolic sent. MW has sent this in past. rx has been sent and pt needed nothing further

## 2012-05-08 ENCOUNTER — Ambulatory Visit: Payer: Self-pay | Admitting: Cardiology

## 2012-05-26 ENCOUNTER — Ambulatory Visit: Payer: Self-pay | Admitting: Cardiology

## 2012-07-05 DIAGNOSIS — I519 Heart disease, unspecified: Secondary | ICD-10-CM | POA: Insufficient documentation

## 2012-07-05 DIAGNOSIS — I2722 Pulmonary hypertension due to left heart disease: Secondary | ICD-10-CM | POA: Insufficient documentation

## 2012-08-10 ENCOUNTER — Encounter: Payer: Self-pay | Admitting: Cardiovascular Disease

## 2012-11-24 ENCOUNTER — Telehealth: Payer: Self-pay | Admitting: Internal Medicine

## 2012-11-24 NOTE — Telephone Encounter (Signed)
The records are not clear re this diagnosis (previous study may not be completely reliable for technical reasons)  and I would be happy to go over the records with him but the best  Way to sort this out is have the pft's repeated on same day he sees me in my office
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
## 2012-11-24 NOTE — Telephone Encounter (Signed)
appt made for 12-24-12 for PFT and OV. Carron Curie, CMA

## 2012-12-24 ENCOUNTER — Ambulatory Visit: Payer: Self-pay | Admitting: Internal Medicine

## 2013-07-02 ENCOUNTER — Ambulatory Visit: Payer: Self-pay | Admitting: Cardiovascular Disease

## 2013-07-26 ENCOUNTER — Encounter: Payer: Self-pay | Admitting: Cardiovascular Disease

## 2013-07-26 ENCOUNTER — Encounter: Payer: Self-pay | Admitting: *Deleted

## 2013-07-26 ENCOUNTER — Ambulatory Visit (INDEPENDENT_AMBULATORY_CARE_PROVIDER_SITE_OTHER): Payer: Self-pay | Admitting: Cardiovascular Disease

## 2013-07-26 VITALS — BP 179/91 | HR 89 | Ht 67.0 in | Wt 212.0 lb

## 2013-07-26 DIAGNOSIS — R0609 Other forms of dyspnea: Secondary | ICD-10-CM

## 2013-07-26 DIAGNOSIS — I519 Heart disease, unspecified: Secondary | ICD-10-CM

## 2013-07-26 DIAGNOSIS — I10 Essential (primary) hypertension: Secondary | ICD-10-CM

## 2013-07-26 DIAGNOSIS — R0989 Other specified symptoms and signs involving the circulatory and respiratory systems: Secondary | ICD-10-CM

## 2013-07-26 DIAGNOSIS — R079 Chest pain, unspecified: Secondary | ICD-10-CM

## 2013-07-26 DIAGNOSIS — F17200 Nicotine dependence, unspecified, uncomplicated: Secondary | ICD-10-CM

## 2013-07-26 DIAGNOSIS — Z720 Tobacco use: Secondary | ICD-10-CM

## 2013-07-26 DIAGNOSIS — I428 Other cardiomyopathies: Secondary | ICD-10-CM

## 2013-07-26 DIAGNOSIS — Z79899 Other long term (current) drug therapy: Secondary | ICD-10-CM

## 2013-07-26 DIAGNOSIS — R942 Abnormal results of pulmonary function studies: Secondary | ICD-10-CM

## 2013-07-26 MED ORDER — FUROSEMIDE 40 MG PO TABS: 40.0000 mg | ORAL_TABLET | Freq: Two times a day (BID) | ORAL | Status: DC

## 2013-07-26 MED ORDER — AMLODIPINE BESYLATE 10 MG PO TABS: 10.0000 mg | ORAL_TABLET | Freq: Every day | ORAL | Status: DC

## 2013-07-26 MED ORDER — HYDRALAZINE HCL 50 MG PO TABS: ORAL_TABLET | ORAL | Status: DC

## 2013-07-26 MED ORDER — LISINOPRIL 20 MG PO TABS: 20.0000 mg | ORAL_TABLET | Freq: Two times a day (BID) | ORAL | Status: DC
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
## 2013-07-26 NOTE — Patient Instructions (Addendum)
   Amlodipine 10mg  daily - refill sent to pharm   Decrease Lasix to 40mg  twice a day - new sent to pharm  Decrease Hydralazine to 100mg  every morning & 50mg  every evening - new sent to pharm   Increase Lisinopril to 20mg  twice a day  - new sent to pharm Continue all other medications.    Lab for BMET - due in 1 week (around 08/02/2013) Your physician has requested that you have a lexiscan myoview. For further information please visit [email or web address]. Please follow instruction sheet, as given. Your physician has recommended that you have a pulmonary function test. Pulmonary Function Tests are a group of tests that measure how well air moves in and out of your lungs. Office will contact with results via phone or letter.   Establish with primary MD - list provided  Follow up in  6 weeks

## 2013-07-28 ENCOUNTER — Encounter: Payer: Self-pay | Admitting: Cardiology

## 2013-08-03 ENCOUNTER — Encounter (HOSPITAL_COMMUNITY): Payer: Self-pay

## 2013-08-03 ENCOUNTER — Other Ambulatory Visit (HOSPITAL_COMMUNITY): Payer: Self-pay

## 2013-08-04 ENCOUNTER — Ambulatory Visit (HOSPITAL_COMMUNITY): Payer: Self-pay

## 2013-08-04 ENCOUNTER — Ambulatory Visit (HOSPITAL_COMMUNITY): Admission: RE | Admit: 2013-08-04 | Payer: Self-pay | Source: Ambulatory Visit

## 2014-10-31 ENCOUNTER — Encounter: Payer: Self-pay | Admitting: Cardiovascular Disease

## 2014-12-01 ENCOUNTER — Other Ambulatory Visit: Payer: Self-pay | Admitting: Neurology

## 2014-12-01 DIAGNOSIS — G459 Transient cerebral ischemic attack, unspecified: Secondary | ICD-10-CM

## 2014-12-08 ENCOUNTER — Ambulatory Visit (INDEPENDENT_AMBULATORY_CARE_PROVIDER_SITE_OTHER): Payer: Medicaid Other | Admitting: Otolaryngology

## 2014-12-08 DIAGNOSIS — H6121 Impacted cerumen, right ear: Secondary | ICD-10-CM

## 2014-12-08 DIAGNOSIS — R490 Dysphonia: Secondary | ICD-10-CM | POA: Diagnosis not present

## 2014-12-15 ENCOUNTER — Ambulatory Visit (HOSPITAL_COMMUNITY)
Admission: RE | Admit: 2014-12-15 | Discharge: 2014-12-15 | Disposition: A | Discharge status: Home / Self Care | Payer: Medicaid Other | Source: Ambulatory Visit | Attending: Neurology | Admitting: Neurology

## 2014-12-15 ENCOUNTER — Ambulatory Visit (HOSPITAL_COMMUNITY): Admission: RE | Admit: 2014-12-15 | Payer: Medicaid Other | Source: Ambulatory Visit

## 2014-12-15 DIAGNOSIS — G459 Transient cerebral ischemic attack, unspecified: Secondary | ICD-10-CM | POA: Diagnosis not present

## 2014-12-15 DIAGNOSIS — Z720 Tobacco use: Secondary | ICD-10-CM | POA: Diagnosis not present

## 2014-12-15 DIAGNOSIS — I10 Essential (primary) hypertension: Secondary | ICD-10-CM | POA: Insufficient documentation

## 2015-04-24 ENCOUNTER — Institutional Professional Consult (permissible substitution): Payer: Self-pay | Admitting: Internal Medicine

## 2015-05-02 ENCOUNTER — Other Ambulatory Visit (INDEPENDENT_AMBULATORY_CARE_PROVIDER_SITE_OTHER): Payer: Medicaid Other

## 2015-05-02 ENCOUNTER — Ambulatory Visit (INDEPENDENT_AMBULATORY_CARE_PROVIDER_SITE_OTHER)
Admission: RE | Admit: 2015-05-02 | Discharge: 2015-05-02 | Disposition: A | Discharge status: Home / Self Care | Payer: Medicaid Other | Source: Ambulatory Visit | Attending: Internal Medicine | Admitting: Internal Medicine

## 2015-05-02 ENCOUNTER — Ambulatory Visit (INDEPENDENT_AMBULATORY_CARE_PROVIDER_SITE_OTHER): Payer: Medicaid Other | Admitting: Internal Medicine

## 2015-05-02 ENCOUNTER — Encounter: Payer: Self-pay | Admitting: Internal Medicine

## 2015-05-02 VITALS — BP 164/108 | HR 101 | Ht 70.0 in | Wt 190.8 lb

## 2015-05-02 DIAGNOSIS — F17210 Nicotine dependence, cigarettes, uncomplicated: Secondary | ICD-10-CM

## 2015-05-02 DIAGNOSIS — J449 Chronic obstructive pulmonary disease, unspecified: Secondary | ICD-10-CM

## 2015-05-02 DIAGNOSIS — Z720 Tobacco use: Secondary | ICD-10-CM

## 2015-05-02 DIAGNOSIS — I519 Heart disease, unspecified: Secondary | ICD-10-CM

## 2015-05-02 DIAGNOSIS — R0600 Dyspnea, unspecified: Secondary | ICD-10-CM

## 2015-05-02 LAB — CBC WITH DIFFERENTIAL/PLATELET
BASOS PCT: 0.7 % (ref 0.0–3.0)
Basophils Absolute: 0.1 10*3/uL (ref 0.0–0.1)
Eosinophils Absolute: 0.1 10*3/uL (ref 0.0–0.7)
Eosinophils Relative: 1 % (ref 0.0–5.0)
HEMATOCRIT: 52.6 % — AB (ref 39.0–52.0)
Hemoglobin: 17.9 g/dL — ABNORMAL HIGH (ref 13.0–17.0)
LYMPHS PCT: 26.7 % (ref 12.0–46.0)
Lymphs Abs: 2.1 10*3/uL (ref 0.7–4.0)
MCHC: 34 g/dL (ref 30.0–36.0)
MCV: 92 fl (ref 78.0–100.0)
MONOS PCT: 7.8 % (ref 3.0–12.0)
Monocytes Absolute: 0.6 10*3/uL (ref 0.1–1.0)
NEUTROS ABS: 5 10*3/uL (ref 1.4–7.7)
Neutrophils Relative %: 63.8 % (ref 43.0–77.0)
PLATELETS: 400 10*3/uL (ref 150.0–400.0)
RBC: 5.72 Mil/uL (ref 4.22–5.81)
RDW: 13.7 % (ref 11.5–15.5)
WBC: 7.9 10*3/uL (ref 4.0–10.5)

## 2015-05-02 LAB — BASIC METABOLIC PANEL
BUN: 12 mg/dL (ref 6–23)
CHLORIDE: 99 meq/L (ref 96–112)
CO2: 32 meq/L (ref 19–32)
Calcium: 9.4 mg/dL (ref 8.4–10.5)
Creatinine, Ser: 0.92 mg/dL (ref 0.40–1.50)
GFR: 90.27 mL/min (ref >60.00)
GLUCOSE: 130 mg/dL — AB (ref 70–99)
POTASSIUM: 3.4 meq/L — AB (ref 3.5–5.1)
SODIUM: 140 meq/L (ref 135–145)

## 2015-05-02 LAB — TSH: TSH: 0.86 u[IU]/mL (ref 0.35–4.50)

## 2015-05-02 LAB — BRAIN NATRIURETIC PEPTIDE: Pro B Natriuretic peptide (BNP): 260 pg/mL — ABNORMAL HIGH (ref 0.0–100.0)

## 2015-05-02 NOTE — Progress Notes (Signed)
Subjective:    Patient ID: Henry Bryant, male    DOB: [birth year],   MRN: [id number]  HPI  37 yowm active smoker prev eval in 2013 with prob acei/ BB related breathing and referred back to cardiology to consider alternative rx but apparently never changed  and gradually worse since 2013 with sob/ cough self referred back to pulmonary clinic 05/02/2015    05/02/2015  Self referral/ [facility]  Chief Complaint  Patient presents with  . Pulmonary Consult    Pt last seen here in 2013. He c/o increased chest congestion, cough, hoarseness and SOB "all of the time" for the past year. His cough is severe to the point of vomiting occ.   progressive decline activity tol but still can walk a mile s stopping albeit strolling  strolling speed- interestingly he really does not have dyspnea proportionate activity as he can be short of breath at rest especially if coughing to the point of choking and vomiting.  Also note that his problem is more daytime and nighttime does not have the typical smoker's cough in the morning waking him up.  Worse also with voice use/ constant sense of throat and chest congestion and postnasal drainage.  No obvious  patterns in day to day or daytime variabilty or assoc   cp or chest tightness, subjective wheeze overt  hb symptoms. No unusual exp hx or h/o childhood pna/ asthma or knowledge of premature birth.  Sleeping ok without nocturnal  or early am exacerbation  of respiratory  c/o's or need for noct saba. Also denies any obvious fluctuation of symptoms with weather or environmental changes or other aggravating or alleviating factors except as outlined above   Current Medications, Allergies, Complete Past Medical History, Past Surgical History, Family History, and Social History were reviewed in [employer] record.            Review of Systems  Constitutional: Positive for appetite change and unexpected weight change. Negative for fever, chills and  activity change.  HENT: Positive for congestion, dental problem and sneezing. Negative for postnasal drip, rhinorrhea, sore throat, trouble swallowing and voice change.   Eyes: Negative for visual disturbance.  Respiratory: Positive for cough and shortness of breath. Negative for choking.   Cardiovascular: Positive for leg swelling. Negative for chest pain.  Gastrointestinal: Negative for nausea, vomiting and abdominal pain.  Genitourinary: Negative for difficulty urinating.  Musculoskeletal: Negative for arthralgias.  Skin: Negative for rash.  Psychiatric/Behavioral: Negative for behavioral problems and confusion.       Objective:   Physical Exam  amb wm mod hoarse wm nad with classic pseudowheeze   Wt Readings from Last 3 Encounters:  05/02/15 190 lb 12.8 oz (86.546 kg)  12/15/14 197 lb (89.359 kg)  07/26/13 212 lb (96.163 kg)    Vital signs reviewed   HEENT: very poor dentition lower, upper all gone, turbinates, and orophanx. Nl external ear canals without cough reflex   NECK :  without JVD/Nodes/TM/ nl carotid upstrokes bilaterally   LUNGS: no acc muscle use, clear to A and P bilaterally without cough on insp or exp maneuvers   CV:  RRR  no s3 or murmur or increase in P2, no edema   ABD:  soft and nontender with nl excursion in the supine position. No bruits or organomegaly, bowel sounds nl  MS:  warm without deformities, calf tenderness, cyanosis or clubbing  SKIN: warm and dry without lesions    NEURO:  alert, approp, no  deficits    CXR PA and Lateral:   05/02/2015 :    I personally reviewed images and agree with radiology impression as follows:    COPD with no evidence of pneumonia. Recommend repeating the PA view with nipple markers to determine if nodular opacity over the left lower lobe does represent a nipple Shadow.  Labs ordered/ reviewed:      Chemistry      Component Value Date/Time   NA 140 05/02/2015 1444   K 3.4* 05/02/2015 1444   CL 99  05/02/2015 1444   CO2 32 05/02/2015 1444   BUN 12 05/02/2015 1444   CREATININE 0.92 05/02/2015 1444      Component Value Date/Time   CALCIUM 9.4 05/02/2015 1444        Lab Results  Component Value Date   WBC 7.9 05/02/2015   HGB 17.9* 05/02/2015   HCT 52.6* 05/02/2015   MCV 92.0 05/02/2015   PLT 400.0 05/02/2015         Lab Results  Component Value Date   TSH 0.86 05/02/2015     Lab Results  Component Value Date   PROBNP 260.0* 05/02/2015                 Assessment & Plan:

## 2015-05-02 NOTE — Patient Instructions (Addendum)
Stop benapril and coreg  Start Bystolic 10 mg daily and benicar 40 one half daily for now - call me if bp too high or too low for further instruction/ tweaking your meds   Please remember to go to the lab and x-ray department downstairs for your tests - we will call you with the results when they are available.  Please schedule a follow up office visit in 2 weeks, sooner if needed and 4 weeks for pfts    Add needs cxr with nipple markers on return

## 2015-05-03 ENCOUNTER — Telehealth: Payer: Self-pay | Admitting: Internal Medicine

## 2015-05-03 DIAGNOSIS — F17210 Nicotine dependence, cigarettes, uncomplicated: Secondary | ICD-10-CM | POA: Insufficient documentation

## 2015-05-03 MED ORDER — OLMESARTAN MEDOXOMIL-HCTZ 40-12.5 MG PO TABS: ORAL_TABLET | ORAL | Status: DC

## 2015-05-03 MED ORDER — NEBIVOLOL HCL 10 MG PO TABS: 10.0000 mg | ORAL_TABLET | Freq: Every day | ORAL | Status: DC

## 2015-05-03 NOTE — Progress Notes (Signed)
Quick Note:  Spoke with pt and notified of results per Dr. Wert. Pt verbalized understanding and denied any questions.  ______ 

## 2015-05-03 NOTE — Assessment & Plan Note (Signed)

## 2015-05-03 NOTE — Assessment & Plan Note (Signed)
-   PFT's  02/03/12   FEV1 2.37 (61 % ) ratio 58  p 29 % improvement from saba with DLCO  59 % corrects to 81 % for alv volume    Although he has continued to smoke, he still clinically has very mild COPD and I would like to focus on taking off medicines that may be contributing to his symptoms before any new ones to address those symptoms.  Specifically he needs a trial off of both Ace inhibitors and nonspecific beta blockers until he returns to see me in 2 weeks and then will regroup and go from there.

## 2015-05-03 NOTE — Assessment & Plan Note (Signed)
Echocardiogram 11/13/2011 moderate to severe dilatation of the left ventricle with global hypokinesis and ejection fraction 30-35% Moderate dilatation of the left atrium Normal RV size mild RV systolic dysfunction.  Moderate pulmonary hypertension with right ventricular systolic pressure of 54 mmHg Elevated BNP level at 661 on 11/13/2011 Myoview study 11/22/2011 ejection fraction 28% global hypokinesis with perfusion defects although not suggestive of ischemia. - trial off acei and coreg 05/02/2015 due to poorly controlled copd./ab vs pseudoasthma   In the best review of chronic cough to date (week of 04/24/15 NEJM) ,  ACEi are rated at causing cough in 20% of pts which is a 4 fold increase from previous reports and does not include the variety of non-specific complaints we see in pulmonary clinic in pts on ACEi but previously attributed to copd/asthma to include PNDS, throat and chest congestion, "bronchitis", unexplained dyspnea and noct "strangling" sensations as well as atypical /refractory GERD symptoms like upper dysphagia.   The only way to prove this is not an "ACEi Case" is a trial off ACEi x a minimum of 6 weeks then regroup.   Also in terms of BB > Strongly prefer in this setting: Bystolic, the most beta -1  selective Beta blocker available in sample form, with bisoprolol the most selective generic choice  on the market.   Try on samples of bystolic 10 mg and benicar 40/12.5 with f/u here in this clinic q 2 weeks until we are able to sort out his needs  Discussed with pt: Unlike when you get a prescription for eyeglasses, it's not possible to always walk out of this or any medical office with a perfect prescription that is immediately effective  based on any test that we offer here.    On the contrary, it may take several weeks for the full impact of changes recommened today - hopefully you will respond well.  If not, then we'll adjust your medication on your next visit accordingly, knowing  more then than we can possibly know now.

## 2015-05-03 NOTE — Assessment & Plan Note (Addendum)
-   trial off acei and coreg 05/02/2015    Symptoms (sob at rest/ non bronchitic type cough) are markedly disproportionate to objective findings and not clear this is a lung problem but pt does appear to have difficult airway management issues. DDX of  difficult airways management all start with A and  include Adherence, Ace Inhibitors, Acid Reflux, Active Sinus Disease, Alpha 1 Antitripsin deficiency, Anxiety masquerading as Airways dz,  ABPA,  allergy(esp in young), Aspiration (esp in elderly), Adverse effects of meds,  Active smokers, A bunch of PE's (a small clot burden can't cause this syndrome unless there is already severe underlying pulm or vascular dz with poor reserve) plus two Bs  = Bronchiectasis and Beta blocker use..and one C= CHF  Adherence is always the initial "prime suspect" and is a multilayered concern that requires a "trust but verify" approach in every patient - starting with knowing how to use medications, especially inhalers, correctly, keeping up with refills and understanding the fundamental difference between maintenance and prns vs those medications only taken for a very short course and then stopped and not refilled.   ACEi at top of the usual list of suspects and he deserves a trial off at this point. SEE  Hypertension  Active smoking the other big concern > see sep a/p  ? Acid (or non-acid) GERD > always difficult to exclude as up to 75% of pts in some series report no assoc GI/ Heartburn symptoms> rec max   diet restrictions/ reviewed and instructions given in writing.  ? chf > bnp intermediate, don't believe this comes anywhere near explaining sense of sob at rest  ? Anxiety > always dx of exclusion  I had an extended discussion with the patient reviewing all relevant studies completed to date and  lasting 35 minutes of a 60 minute visit    Each maintenance medication was reviewed in detail including most importantly the difference between maintenance and prns and  under what circumstances the prns are to be triggered using an action plan format that is not reflected in the computer generated alphabetically organized AVS.    Please see instructions for details which were reviewed in writing and the patient given a copy highlighting the part that I personally wrote and discussed at today's ov.

## 2015-05-03 NOTE — Telephone Encounter (Signed)
Result Notes     Notes Recorded by Inge Rise, CMA on 05/03/2015 at 9:39 AM lmomtcb x1 ------  Notes Recorded by Tanda Rockers, MD on 05/03/2015 at 5:45 AM Call pt: Reviewed cxr and it's probably wnl but the radiologist once repeated with nipple markers just to make sure what were seen on the x-ray is nothing more than superimposed shadows from his breast tissue on one of the views so we can do this on return    Notes Recorded by Inge Rise, [place] on 05/03/2015 at 9:39 AM lmomtcb x1 Notes Recorded by Tanda Rockers, MD on 05/02/2015 at 5:33 PM Call patient : Studies are unremarkable, no change in recs  lmtcb x2 for pt.

## 2015-05-04 ENCOUNTER — Telehealth: Payer: Self-pay | Admitting: Internal Medicine

## 2015-05-04 NOTE — Telephone Encounter (Signed)
Spoke with pt. States that MW changed his medications yesterday >> Stop benapril and coreg.Start Bystolic 10 mg daily and benicar 40 one half daily for now - call me if bp too high or too low for further instruction/ tweaking your meds. He checked his BP and it's running high >> 191/91. Feels he needs to take a whole tablet of Benicar.  MW - please advise. Thanks.

## 2015-05-04 NOTE — Telephone Encounter (Signed)
lmomtcb for pt 

## 2015-05-04 NOTE — Telephone Encounter (Signed)
Yes agree If still not optimal then take the bystolic bid also

## 2015-05-04 NOTE — Telephone Encounter (Signed)
Spoke with the pt and advise of recs per MW  He verbalized understanding and nothing further needed

## 2015-05-04 NOTE — Telephone Encounter (Signed)
See prev message

## 2015-05-04 NOTE — Telephone Encounter (Signed)
Pt has been given his cxr results

## 2015-05-16 ENCOUNTER — Encounter: Payer: Self-pay | Admitting: Internal Medicine

## 2015-05-16 ENCOUNTER — Other Ambulatory Visit: Payer: Self-pay | Admitting: Internal Medicine

## 2015-05-16 ENCOUNTER — Ambulatory Visit (INDEPENDENT_AMBULATORY_CARE_PROVIDER_SITE_OTHER)
Admission: RE | Admit: 2015-05-16 | Discharge: 2015-05-16 | Disposition: A | Discharge status: Home / Self Care | Payer: Medicaid Other | Source: Ambulatory Visit | Attending: Internal Medicine | Admitting: Internal Medicine

## 2015-05-16 ENCOUNTER — Ambulatory Visit (INDEPENDENT_AMBULATORY_CARE_PROVIDER_SITE_OTHER): Payer: Medicaid Other | Admitting: Internal Medicine

## 2015-05-16 VITALS — BP 168/90 | HR 72 | Ht 70.0 in | Wt 196.0 lb

## 2015-05-16 DIAGNOSIS — F17210 Nicotine dependence, cigarettes, uncomplicated: Secondary | ICD-10-CM

## 2015-05-16 DIAGNOSIS — I10 Essential (primary) hypertension: Secondary | ICD-10-CM | POA: Diagnosis not present

## 2015-05-16 DIAGNOSIS — Z720 Tobacco use: Secondary | ICD-10-CM | POA: Diagnosis not present

## 2015-05-16 DIAGNOSIS — J449 Chronic obstructive pulmonary disease, unspecified: Secondary | ICD-10-CM

## 2015-05-16 MED ORDER — MINOXIDIL 2.5 MG PO TABS: ORAL_TABLET | ORAL | Status: DC

## 2015-05-16 NOTE — Progress Notes (Signed)
Quick Note:  LMTCB ______ 

## 2015-05-16 NOTE — Patient Instructions (Addendum)
Stop hydralazine  Start minoxidil 2.5  Mg x 2  daily - if too high take three  if too low take one   daily   Please remember to go to the  x-ray department downstairs for your tests - we will call you with the results when they are available.     Keep appt with pfts as planned

## 2015-05-16 NOTE — Progress Notes (Addendum)
Subjective:    Patient ID: Henry Bryant, male    DOB: 09/11/1960,   MRN: [id number]    Brief patient profile:  13 yowm active smoker prev eval in 2013 with prob acei/ BB related sob and referred back to cardiology to consider alternative rx but apparently never changed  and gradually worse since 2013 with sob/ cough self referred back to pulmonary clinic 05/02/2015    05/02/2015  Self referral/ Henry Bryant  Chief Complaint  Patient presents with  . Pulmonary Consult    Pt last seen here in 2013. He c/o increased chest congestion, cough, hoarseness and SOB "all of the time" for the past year. His cough is severe to the point of vomiting occ.   progressive decline activity tol but still can walk a mile s stopping albeit  strolling speed- interestingly he really does not have dyspnea proportionate activity as he can be short of breath at rest especially if coughing to the point of choking and vomiting.  Also note that his problem is more daytime and nighttime does not have the typical smoker's cough in the morning waking him up.  Worse also with voice use/ constant sense of throat and chest congestion and postnasal drainage. rec Stop benapril and coreg Start Bystolic 10 mg daily and benicar 40 one half daily for now - call me if bp too high or too low for further instruction/ tweaking your meds  Please remember to go to the lab and x-ray department downstairs for your tests - we will call you with the results when they are available. Add needs cxr with nipple markers on return    05/16/2015  f/u ov/Henry Bryant re: unexplained sob  Chief Complaint  Patient presents with  . Follow-up    Pt states that his cough has improved some. His breathing is no better. BP has been elevated since the last visit.   doe = MMRC1 = can walk nl pace, flat grade, can't hurry or go uphills or steps s sob    Resting sob/ choking/ voice change all better off acei   No obvious day to day or daytime variability or assoc  chronic cough or cp or chest tightness, subjective wheeze or overt sinus or hb symptoms. No unusual exp hx or h/o childhood pna/ asthma or knowledge of premature birth.  Sleeping ok without nocturnal  or early am exacerbation  of respiratory  c/o's or need for noct saba. Also denies any obvious fluctuation of symptoms with weather or environmental changes or other aggravating or alleviating factors except as outlined above   Current Medications, Allergies, Complete Past Medical History, Past Surgical History, Family History, and Social History were reviewed in [employer] record.  ROS  The following are not active complaints unless bolded sore throat, dysphagia, dental problems, itching, sneezing,  nasal congestion or excess/ purulent secretions, ear ache,   fever, chills, sweats, unintended wt loss, classically pleuritic or exertional cp, hemoptysis,  orthopnea pnd or leg swelling, presyncope, palpitations, abdominal pain, anorexia, nausea, vomiting, diarrhea  or change in bowel or bladder habits, change in stools or urine, dysuria,hematuria,  rash, arthralgias, visual complaints, headache, numbness, weakness or ataxia or problems with walking or coordination,  change in mood/affect or memory.            Objective:   Physical Exam  amb wm nad     05/16/2015        196   05/02/15 190 lb 12.8 oz (86.546 kg)  12/15/14 197 lb (89.359 kg)  07/26/13 212 lb (96.163 kg)    Vital signs reviewed   HEENT: very poor dentition lower, upper all gone, turbinates, and orophanx. Nl external ear canals without cough reflex   NECK :  without JVD/Nodes/TM/ nl carotid upstrokes bilaterally   LUNGS: no acc muscle use, clear to A and P bilaterally without cough on insp or exp maneuvers   CV:  RRR  no s3 or murmur or increase in P2, no edema   ABD:  soft and nontender with nl excursion in the supine position. No bruits or organomegaly, bowel sounds nl  MS:  warm without  deformities, calf tenderness, cyanosis or clubbing  SKIN: warm and dry without lesions    NEURO:  alert, approp, no deficits     CXR PA and Lateral:   05/16/2015 :    I personally reviewed images and agree with radiology impression as follows:    The nipple marker on the left corresponds to the subtle nodule demonstrated on the previous study. There are stable changes of COPD and mild pulmonary fibrosis. No acute cardiopulmonary abnormality is Observed.    Labs  reviewed:      Chemistry      Component Value Date/Time   NA 140 05/02/2015 1444   K 3.4* 05/02/2015 1444   CL 99 05/02/2015 1444   CO2 32 05/02/2015 1444   BUN 12 05/02/2015 1444   CREATININE 0.92 05/02/2015 1444      Component Value Date/Time   CALCIUM 9.4 05/02/2015 1444        Lab Results  Component Value Date   WBC 7.9 05/02/2015   HGB 17.9* 05/02/2015   HCT 52.6* 05/02/2015   MCV 92.0 05/02/2015   PLT 400.0 05/02/2015         Lab Results  Component Value Date   TSH 0.86 05/02/2015     Lab Results  Component Value Date   PROBNP 260.0* 05/02/2015                 Assessment & Plan:   Outpatient Encounter Prescriptions as of 05/16/2015  Medication Sig  . aspirin 81 MG tablet Take 81 mg by mouth daily.  . furosemide (LASIX) 40 MG tablet Take 1 tablet (40 mg total) by mouth 2 (two) times daily.  Marland Kitchen gabapentin (NEURONTIN) 400 MG capsule Take 400 mg by mouth 4 (four) times daily.  . metFORMIN (GLUCOPHAGE) 1000 MG tablet Take 1,000 mg by mouth 2 (two) times daily with a meal.  . nebivolol (BYSTOLIC) 10 MG tablet Take 20 mg by mouth daily.  Marland Kitchen olmesartan-hydrochlorothiazide (BENICAR HCT) 40-12.5 MG tablet Take 1 tablet by mouth daily.  . Potassium Chloride (KLOR-CON PO) Take 1 tablet by mouth daily.  . [DISCONTINUED] hydrALAZINE (APRESOLINE) 50 MG tablet Take 100 mg by mouth 2 (two) times daily.  . minoxidil (LONITEN) 2.5 MG tablet Tak 2 each am  . [DISCONTINUED] isosorbide mononitrate  (IMDUR) 120 MG 24 hr tablet Take 1 tablet (120 mg total) by mouth daily.  . [DISCONTINUED] nebivolol (BYSTOLIC) 10 MG tablet Take 1 tablet (10 mg total) by mouth daily. (Patient not taking: Reported on 05/16/2015)  . [DISCONTINUED] olmesartan-hydrochlorothiazide (BENICAR HCT) 40-12.5 MG tablet One half daily (Patient taking differently: 1 tablet)   No facility-administered encounter medications on file as of 05/16/2015.

## 2015-05-17 ENCOUNTER — Telehealth: Payer: Self-pay | Admitting: Internal Medicine

## 2015-05-17 ENCOUNTER — Encounter: Payer: Self-pay | Admitting: Internal Medicine

## 2015-05-17 NOTE — Telephone Encounter (Signed)
Patient notified of CXR results. Nothing further needed. Closing encounter  

## 2015-05-17 NOTE — Assessment & Plan Note (Signed)
Not adequately controlled on his present regimen but he is adequately beta blocked and diuresed so  I think the problem at this point is he needs more potent afterload reduction and I recommended minoxidil starting at 2.5 mg twice a day to reduce to 2.5 mg daily if 2 strong and up to 7.5 mg if not strong enough.  I had an extended discussion with the patient reviewing all relevant studies completed to date and  lasting 15 to 20 minutes of a 25 minute visit    Each maintenance medication was reviewed in detail including most importantly the difference between maintenance and prns and under what circumstances the prns are to be triggered using an action plan format that is not reflected in the computer generated alphabetically organized AVS.    Please see instructions for details which were reviewed in writing and the patient given a copy highlighting the part that I personally wrote and discussed at today's ov.

## 2015-05-17 NOTE — Assessment & Plan Note (Signed)
>   3 m  I took an extended  opportunity with this patient to outline the consequences of continued cigarette use  in airway disorders based on all the data we have from the multiple national lung health studies (perfomed over decades at millions of dollars in cost)  indicating that smoking cessation, not choice of inhalers or physicians, is the most important aspect of care.    Not committed to quit at this point.

## 2015-05-17 NOTE — Assessment & Plan Note (Addendum)
-   PFT's  02/03/12   FEV1 2.37 (61 % ) ratio 58  p 29 % improvement from saba with DLCO  59 % corrects to 81 % for alv volume    He clinically does not have enough COPD to explain all his symptoms (many of which resolved off ACEi) and most likely suffers from obesity with deconditioning more than he does airflow limitation at this point  but to sort this out we need to have him come back on specific beta blockers for follow-up spirometry and in the meantime do a better job controlling his blood pressure because he may have a component of chf  also limiting him based on exam and bnp, though intermediate range.  First and foremost he needs to quit smoking > see sep a/p

## 2015-05-22 ENCOUNTER — Telehealth: Payer: Self-pay | Admitting: Internal Medicine

## 2015-05-22 NOTE — Telephone Encounter (Signed)
Dr. Melvyn Novas put patient on 2 Bystolic, 1 Benicar, Minoxil.  Patient says that his blood pressure has been running down to 166/79.  Patient wants to know if he needs to increase the Minoxil to 3?  He is currently taking 2.

## 2015-05-22 NOTE — Telephone Encounter (Signed)
Yes that's fine - take 2 in am and 1 in pm

## 2015-05-23 NOTE — Telephone Encounter (Signed)
lmtcb for pt.  

## 2015-05-24 NOTE — Telephone Encounter (Signed)
Patient notified of Dr. Gustavus Bryant recommendations. Nothing further needed. Closing encounter

## 2015-06-07 ENCOUNTER — Telehealth: Payer: Self-pay | Admitting: Internal Medicine

## 2015-06-07 NOTE — Telephone Encounter (Signed)
Called spoke with pt. Aware samples of benicar hct placed for pick up. Nothing further needed

## 2015-06-12 ENCOUNTER — Encounter: Payer: Self-pay | Admitting: Internal Medicine

## 2015-06-12 ENCOUNTER — Ambulatory Visit (INDEPENDENT_AMBULATORY_CARE_PROVIDER_SITE_OTHER): Payer: Medicaid Other | Admitting: Internal Medicine

## 2015-06-12 ENCOUNTER — Telehealth: Payer: Self-pay | Admitting: Internal Medicine

## 2015-06-12 VITALS — BP 152/90 | HR 75 | Ht 70.0 in | Wt 201.6 lb

## 2015-06-12 DIAGNOSIS — R0600 Dyspnea, unspecified: Secondary | ICD-10-CM | POA: Diagnosis not present

## 2015-06-12 DIAGNOSIS — I10 Essential (primary) hypertension: Secondary | ICD-10-CM

## 2015-06-12 DIAGNOSIS — J449 Chronic obstructive pulmonary disease, unspecified: Secondary | ICD-10-CM

## 2015-06-12 MED ORDER — METHYLPREDNISOLONE ACETATE 80 MG/ML IJ SUSP
120.0000 mg | Freq: Once | INTRAMUSCULAR | Status: AC
  Administered 2015-06-12: 120 mg via INTRAMUSCULAR

## 2015-06-12 MED ORDER — OLMESARTAN MEDOXOMIL-HCTZ 40-12.5 MG PO TABS: 1.0000 | ORAL_TABLET | Freq: Every day | ORAL | Status: DC

## 2015-06-12 NOTE — Assessment & Plan Note (Addendum)
-   PFT's  02/03/12   FEV1 2.37 (61 % ) ratio 58  p 29 % improvement from saba with DLCO  59 % corrects to 81 % for alv volume    Acute flare in setting of ? Samuel [place] ? Asthmatic component > depomedrol 120 IM    I had an extended discussion with the patient reviewing all relevant studies completed to date and  lasting 15 to 20 minutes of a 25 minute visit    Explained the natural history of uri and why it's necessary in patients at risk to treat GERD aggressively - at least  short term -   to reduce risk of evolving cyclical cough initially  triggered by epithelial injury and a heightened sensitivty to the effects of any upper airway irritants,  most importantly acid - related - then perpetuated by epithelial injury related to the cough itself as the upper airway collapses on itself.  That is, the more sensitive the epithelium becomes once it is damaged by the virus, the more the ensuing irritability> the more the cough, the more the secondary reflux (especially in those prone to reflux) the more the irritation of the sensitive mucosa and so on in a  Classic cyclical pattern.    Each maintenance medication was reviewed in detail including most importantly the difference between maintenance and prns and under what circumstances the prns are to be triggered using an action plan format that is not reflected in the computer generated alphabetically organized AVS.    Please see instructions for details which were reviewed in writing and the patient given a copy highlighting the part that I personally wrote and discussed at today's ov.

## 2015-06-12 NOTE — Telephone Encounter (Signed)
Spoke w/ MW. He will see pt now. appt scheduled.

## 2015-06-12 NOTE — Progress Notes (Addendum)
Subjective:    Patient ID: Henry Bryant, male    DOB: January 14, 1961,   MRN: [id number]    Brief patient profile:  30 yowm active smoker prev eval in 2013 with prob acei/ BB related sob and referred back to cardiology to consider alternative rx but apparently never changed  and gradually worse since 2013 with sob/ cough self referred back to pulmonary clinic 05/02/2015     History of Present Illness  05/02/2015  Self referral/ Sahirah Rudell  Chief Complaint  Patient presents with  . Pulmonary Consult    Pt last seen here in 2013. He c/o increased chest congestion, cough, hoarseness and SOB "all of the time" for the past year. His cough is severe to the point of vomiting occ.   progressive decline activity tol but still can walk a mile s stopping albeit  strolling speed- interestingly he really does not have dyspnea proportionate activity as he can be short of breath at rest especially if coughing to the point of choking and vomiting.  Also note that his problem is more daytime and nighttime does not have the typical smoker's cough in the morning waking him up.  Worse also with voice use/ constant sense of throat and chest congestion and postnasal drainage. rec Stop benapril and coreg Start Bystolic 10 mg daily and benicar 40 one half daily for now - call me if bp too high or too low for further instruction/ tweaking your meds      05/16/2015  f/u ov/Roquel Burgin re: unexplained sob  Chief Complaint  Patient presents with  . Follow-up    Pt states that his cough has improved some. His breathing is no better. BP has been elevated since the last visit.   doe = MMRC1 = can walk nl pace, flat grade, can't hurry or go uphills or steps s sob    Resting sob/ choking/ voice change all better off acei  rec Stop hydralazine  Start minoxidil 2.5  Mg x 2  daily - if too high take three  if too low take one   daily  Please remember to go to the  x-ray department downstairs for your tests - we will call you with  the results when they are available. Keep appt with pfts as planned   06/12/2015 acute ov/Lynnlee Revels re:  GOLD II copd  with no maint rx  Chief Complaint  Patient presents with  . Acute Visit    c/o chills, HA, prod cough (clear). thinks had flu last week  Satisfied with cough / breathing  Improved off all inhalers so still smoking acutely ill one week prior to OV  Aching all over, green mucus better now occ coughing dry/ breathing is ok Daytime/ not noct cough  No obvious day to day or daytime variability or assoc  cp or chest tightness, subjective wheeze or overt sinus or hb symptoms. No unusual exp hx or h/o childhood pna/ asthma or knowledge of premature birth.  Sleeping ok without nocturnal  or early am exacerbation  of respiratory  c/o's or need for noct saba. Also denies any obvious fluctuation of symptoms with weather or environmental changes or other aggravating or alleviating factors except as outlined above   Current Medications, Allergies, Complete Past Medical History, Past Surgical History, Family History, and Social History were reviewed in [facility] record.  ROS  The following are not active complaints unless bolded sore throat, dysphagia, dental problems, itching, sneezing,  nasal congestion or excess/ purulent  secretions, ear ache,   fever, chills, sweats, unintended wt loss, classically pleuritic or exertional cp, hemoptysis,  orthopnea pnd or leg swelling, presyncope, palpitations, abdominal pain, anorexia, nausea, vomiting, diarrhea  or change in bowel or bladder habits, change in stools or urine, dysuria,hematuria,  rash, arthralgias, visual complaints, headache, numbness, weakness or ataxia or problems with walking or coordination,  change in mood/affect or memory.            Objective:   Physical Exam  amb wm nad    06/12/2015 >    202   05/16/2015        196   05/02/15 190 lb 12.8 oz (86.546 kg)  12/15/14 197 lb (89.359 kg)  07/26/13 212 lb  (96.163 kg)    Vital signs reviewed   HEENT: very poor dentition lower, upper all gone, turbinates, and orophanx. Nl external ear canals without cough reflex   NECK :  without JVD/Nodes/TM/ nl carotid upstrokes bilaterally   LUNGS: no acc muscle use, classic pseudowheeze    CV:  RRR  no s3 or murmur or increase in P2, no edema   ABD:  Pot bettlied/ soft and nontender with nl excursion in the supine position. No bruits or organomegaly, bowel sounds nl  MS:  warm without deformities, calf tenderness, cyanosis or clubbing  SKIN: warm and dry without lesions    NEURO:  alert, approp, no deficits     CXR PA and Lateral:   05/16/2015 :    I personally reviewed images and agree with radiology impression as follows:   The nipple marker on the left corresponds to the subtle nodule demonstrated on the previous study. There are stable changes of COPD and mild pulmonary fibrosis. No acute cardiopulmonary abnormality is Observed.    Labs  reviewed:      Chemistry      Component Value Date/Time   NA 140 05/02/2015 1444   K 3.4* 05/02/2015 1444   CL 99 05/02/2015 1444   CO2 32 05/02/2015 1444   BUN 12 05/02/2015 1444   CREATININE 0.92 05/02/2015 1444      Component Value Date/Time   CALCIUM 9.4 05/02/2015 1444        Lab Results  Component Value Date   WBC 7.9 05/02/2015   HGB 17.9* 05/02/2015   HCT 52.6* 05/02/2015   MCV 92.0 05/02/2015   PLT 400.0 05/02/2015         Lab Results  Component Value Date   TSH 0.86 05/02/2015     Lab Results  Component Value Date   PROBNP 260.0* 05/02/2015                 Assessment & Plan:    Outpatient Encounter Prescriptions as of 06/12/2015  Medication Sig  . aspirin 81 MG tablet Take 81 mg by mouth daily.  . furosemide (LASIX) 40 MG tablet Take 1 tablet (40 mg total) by mouth 2 (two) times daily.  Marland Kitchen gabapentin (NEURONTIN) 400 MG capsule Take 400 mg by mouth 4 (four) times daily.  . metFORMIN (GLUCOPHAGE) 1000  MG tablet Take 1,000 mg by mouth 2 (two) times daily with a meal.  . minoxidil (LONITEN) 2.5 MG tablet Tak 2 each am  . nebivolol (BYSTOLIC) 10 MG tablet Take 20 mg by mouth daily.  Marland Kitchen olmesartan-hydrochlorothiazide (BENICAR HCT) 40-12.5 MG tablet Take 1 tablet by mouth daily.  . Potassium Chloride (KLOR-CON PO) Take 1 tablet by mouth daily.  . [DISCONTINUED] olmesartan-hydrochlorothiazide (BENICAR HCT) 40-12.5 MG  tablet Take 1 tablet by mouth daily.  . [EXPIRED] methylPREDNISolone acetate (DEPO-MEDROL) injection 120 mg    No facility-administered encounter medications on file as of 06/12/2015.

## 2015-06-12 NOTE — Patient Instructions (Addendum)
Robitussin dm or delsym for the cough  depomedrol 120 mg IM   The key is to stop smoking completely before smoking completely stops you!    Because of your history with the cough >  Whenever you cough for any reason > Try prilosec otc 20mg   Take 30-60 min before first meal of the day and Pepcid ac (famotidine) 20 mg one @  bedtime until cough is completely gone for at least a week without the need for cough suppression  GERD (REFLUX)  is an extremely common cause of respiratory symptoms just like yours , many times with no obvious heartburn at all.    It can be treated with medication, but also with lifestyle changes including elevation of the head of your bed (ideally with 6 inch  bed blocks),  Smoking cessation, avoidance of late meals, excessive alcohol, and avoid fatty foods, chocolate, peppermint, colas, red wine, and acidic juices such as orange juice.  NO MINT OR MENTHOL PRODUCTS SO NO COUGH DROPS  USE SUGARLESS CANDY INSTEAD (Jolley ranchers or Stover's or Life Savers) or even ice chips will also do - the key is to swallow to prevent all throat clearing. NO OIL BASED VITAMINS - use powdered substitutes.  Call if can't get the blood pressure meds filled with alternative options  Reschedule pfts / ov for 6 weeks

## 2015-06-15 ENCOUNTER — Ambulatory Visit (INDEPENDENT_AMBULATORY_CARE_PROVIDER_SITE_OTHER): Payer: Self-pay | Admitting: Otolaryngology

## 2015-06-18 ENCOUNTER — Encounter: Payer: Self-pay | Admitting: Internal Medicine

## 2015-06-18 NOTE — Assessment & Plan Note (Addendum)
Ok for now (when he takes his meds consistently which has been an issue with access to meds.  Formulary restrictions will be an ongoing challenge for the forseable future and I would be happy to pick an alternative if the pt will first  provide me a list of them but pt  will need to return here for training for any new device that is required eg dpi vs hfa vs respimat.    In meantime we can always provide samples so the patient never runs out of any needed respiratory medications.

## 2015-06-18 NOTE — Assessment & Plan Note (Signed)
-   trial off acei and coreg 05/02/2015  > marked improvement chronic symptoms  Note now not even sob with what should be by all accounts a typica aecopd

## 2015-06-27 ENCOUNTER — Telehealth: Payer: Self-pay | Admitting: Internal Medicine

## 2015-06-27 NOTE — Telephone Encounter (Signed)
Called spoke with pt. Aware will leave sample of bystolic for pick up. Nothing further needed

## 2015-07-31 ENCOUNTER — Ambulatory Visit: Payer: Self-pay | Admitting: Internal Medicine

## 2015-08-07 ENCOUNTER — Telehealth: Payer: Self-pay | Admitting: Internal Medicine

## 2015-08-07 NOTE — Telephone Encounter (Signed)
Spoke with pt. Needs samples of Benicar and Bystolic. These have been placed up front for pick up. Nothing further was needed.

## 2015-08-11 ENCOUNTER — Telehealth: Payer: Self-pay | Admitting: *Deleted

## 2015-08-11 NOTE — Telephone Encounter (Signed)
Initiated PA for [employer] through [employer]. Sent for review. Spoke with Jana Half who states we can call back in 24hrs to check status. PA# [id number] Ref # [id number]

## 2015-08-17 NOTE — Telephone Encounter (Signed)
PA for Benicar has been approved until 08/05/16. Pharmacy informed.

## 2015-09-05 ENCOUNTER — Encounter: Payer: Self-pay | Admitting: Internal Medicine

## 2015-09-05 ENCOUNTER — Other Ambulatory Visit: Payer: Self-pay | Admitting: Internal Medicine

## 2015-09-05 ENCOUNTER — Ambulatory Visit (INDEPENDENT_AMBULATORY_CARE_PROVIDER_SITE_OTHER): Payer: Medicaid Other | Admitting: Internal Medicine

## 2015-09-05 VITALS — BP 152/88 | HR 71 | Ht 70.0 in | Wt 200.0 lb

## 2015-09-05 DIAGNOSIS — I10 Essential (primary) hypertension: Secondary | ICD-10-CM

## 2015-09-05 DIAGNOSIS — J449 Chronic obstructive pulmonary disease, unspecified: Secondary | ICD-10-CM

## 2015-09-05 MED ORDER — NEBIVOLOL HCL 10 MG PO TABS: 10.0000 mg | ORAL_TABLET | Freq: Every day | ORAL | Status: DC

## 2015-09-05 MED ORDER — BISOPROLOL FUMARATE 5 MG PO TABS: 5.0000 mg | ORAL_TABLET | Freq: Every day | ORAL | Status: DC

## 2015-09-05 MED ORDER — METHYLPREDNISOLONE ACETATE 80 MG/ML IJ SUSP
120.0000 mg | Freq: Once | INTRAMUSCULAR | Status: AC
  Administered 2015-09-05: 120 mg via INTRAMUSCULAR

## 2015-09-05 NOTE — Patient Instructions (Signed)
Start symbicort 160 Take 2 puffs first thing in am and then another 2 puffs about 12 hours later.   Ok to try off in 2 weeks to see if makes  if makes a difference.  Change bystolic to bisoprolol 5 mg daily - ok to take twice daily if not well controlled bp   Keep previous appt

## 2015-09-05 NOTE — Progress Notes (Signed)
Subjective:    Patient ID: Henry Bryant, male    DOB: December 16, 1960,   MRN: [id number]    Brief patient profile:  38 yowm active smoker prev eval in 2013 with prob acei/ BB related sob and referred back to cardiology to consider alternative rx but apparently never changed  and gradually worse since 2013 with sob/ cough self referred back to pulmonary clinic 05/02/2015     History of Present Illness  05/02/2015  Self referral/ Henry Bryant  Chief Complaint  Patient presents with  . Pulmonary Consult    Pt last seen here in 2013. He c/o increased chest congestion, cough, hoarseness and SOB "all of the time" for the past year. His cough is severe to the point of vomiting occ.   progressive decline activity tol but still can walk a mile s stopping albeit  strolling speed- interestingly he really does not have dyspnea proportionate activity as he can be short of breath at rest especially if coughing to the point of choking and vomiting.  Also note that his problem is more daytime and nighttime does not have the typical smoker's cough in the morning waking him up.  Worse also with voice use/ constant sense of throat and chest congestion and postnasal drainage. rec Stop benapril and coreg Start Bystolic 10 mg daily and benicar 40 one half daily for now - call me if bp too high or too low for further instruction/ tweaking your meds      05/16/2015  f/u ov/Henry Bryant re: unexplained sob  Chief Complaint  Patient presents with  . Follow-up    Pt states that his cough has improved some. His breathing is no better. BP has been elevated since the last visit.   doe = MMRC1 = can walk nl pace, flat grade, can't hurry or go uphills or steps s sob    Resting sob/ choking/ voice change all better off acei  rec Stop hydralazine  Start minoxidil 2.5  Mg x 2  daily - if too high take three  if too low take one   daily  Please remember to go to the  x-ray department downstairs for your tests - we will call you with  the results when they are available. Keep appt with pfts as planned   06/12/2015 acute ov/Henry Bryant re:  GOLD II copd  with no maint rx  Chief Complaint  Patient presents with  . Acute Visit    c/o chills, HA, prod cough (clear). thinks had flu last week  Satisfied with cough / breathing  Improved off all inhalers so still smoking acutely ill one week prior to OV  Aching all over, green mucus better now occ coughing dry/ breathing is ok Daytime/ not noct cough rec Robitussin dm or delsym for the cough depomedrol 120 mg IM  The key is to stop smoking completely before smoking completely stops you!  Because of your history with the cough >  Whenever you cough for any reason > Try prilosec otc 20mg   Take 30-60 min before first meal of the day and Pepcid ac (famotidine) 20 mg one @  bedtime until cough is completely gone for at least a week without the need for cough suppression GERD diet  Call if can't get the blood pressure meds filled with alternative options Reschedule pfts / ov for 6 weeks   09/05/2015  f/u ov/Henry Bryant re: GOLD II copd not prev on maint rx  Chief Complaint  Patient presents with  . Follow-up  Pt c/o continued SOB and wheeze with exertion. Pt does use albuterol nebs with some relief. Pt denies cough/CP/tightness. Pt states that new HTN meds are working well. Pt needs new rx for Bystolic.   Only sob with heavy exertion only p after intercourse  and ever since flu x 3 weeks prior to OV   more sob more cough /better with albuterol No ex cp/ diaph or nausea and p saba can double his activity level s symptoms   No obvious day to day or daytime variability or assoc   chest tightness,  or overt sinus or hb symptoms. No unusual exp hx or h/o childhood pna/ asthma or knowledge of premature birth.  Sleeping ok without nocturnal  or early am exacerbation  of respiratory  c/o's or need for noct saba. Also denies any obvious fluctuation of symptoms with weather or environmental changes or  other aggravating or alleviating factors except as outlined above   Current Medications, Allergies, Complete Past Medical History, Past Surgical History, Family History, and Social History were reviewed in [employer] record.  ROS  The following are not active complaints unless bolded sore throat, dysphagia, dental problems, itching, sneezing,  nasal congestion or excess/ purulent secretions, ear ache,   fever, chills, sweats, unintended wt loss, classically pleuritic or exertional cp, hemoptysis,  orthopnea pnd or leg swelling, presyncope, palpitations, abdominal pain, anorexia, nausea, vomiting, diarrhea  or change in bowel or bladder habits, change in stools or urine, dysuria,hematuria,  rash, arthralgias, visual complaints, headache, numbness, weakness or ataxia or problems with walking or coordination,  change in mood/affect or memory.            Objective:   Physical Exam  amb wm nad    06/12/2015 >    202 > 09/05/2015  200   05/16/2015        196   05/02/15 190 lb 12.8 oz (86.546 kg)  12/15/14 197 lb (89.359 kg)  07/26/13 212 lb (96.163 kg)    Vital signs reviewed   HEENT: very poor dentition lower, upper all gone, turbinates, and orophanx. Nl external ear canals without cough reflex   NECK :  without JVD/Nodes/TM/ nl carotid upstrokes bilaterally   LUNGS: no acc muscle use,  Trace end exp wheeze bilaterally     CV:  RRR  no s3 or murmur or increase in P2, no edema   ABD:  Pot bettlied/ soft and nontender with nl excursion in the supine position. No bruits or organomegaly, bowel sounds nl  MS:  warm without deformities, calf tenderness, cyanosis or clubbing  SKIN: warm and dry without lesions    NEURO:  alert, approp, no deficits     CXR PA and Lateral:   05/16/2015 :    I personally reviewed images and agree with radiology impression as follows:   The nipple marker on the left corresponds to the subtle nodule demonstrated on the previous  study. There are stable changes of COPD and mild pulmonary fibrosis. No acute cardiopulmonary abnormality is Observed.                            Assessment & Plan:

## 2015-09-06 ENCOUNTER — Telehealth: Payer: Self-pay | Admitting: *Deleted

## 2015-09-06 ENCOUNTER — Encounter: Payer: Self-pay | Admitting: Internal Medicine

## 2015-09-06 NOTE — Assessment & Plan Note (Addendum)
-   PFT's  02/03/12   FEV1 2.37 (61 % ) ratio 58  p 29 % improvement from saba with DLCO  59 % corrects to 81 % for alv volume   - 09/05/2015  extensive coaching HFA effectiveness =    75% > try symbicort 160 2bid x 2 weeks then off again to see what difference it makes pending f/u pfts  DDX of  difficult airways management almost all start with A and  include Adherence, Ace Inhibitors, Acid Reflux, Active Sinus Disease, Alpha 1 Antitripsin deficiency, Anxiety masquerading as Airways dz,  ABPA,  Allergy(esp in young), Aspiration (esp in elderly), Adverse effects of meds,  Active smokers, A bunch of PE's (a small clot burden can't cause this syndrome unless there is already severe underlying pulm or vascular dz with poor reserve) plus two Bs  = Bronchiectasis and Beta blocker use..and one C= CHF  Adherence is always the initial "prime suspect" and is a multilayered concern that requires a "trust but verify" approach in every patient - starting with knowing how to use medications, especially inhalers, correctly, keeping up with refills and understanding the fundamental difference between maintenance and prns vs those medications only taken for a very short course and then stopped and not refilled.  - The proper method of use, as well as anticipated side effects, of a metered-dose inhaler are discussed and demonstrated to the patient. Improved effectiveness after extensive coaching during this visit to a level of approximately 75 % from a baseline of 50 % > try symbicort 160 2bid sample  ? Allergy/ asthma component > depomedrol 120 IM today but no further steroids for now other than ICS  ? BB effects > Strongly prefer in this setting: Bystolic, the most beta -1  selective Beta blocker available in sample form, with bisoprolol the most selective generic choice  on the market. See hbp (see separate a/p)   ? Cardiac/ IHD > unlikely as that he is able to double his work activity after taking albuterol but he is  at risk  I had an extended discussion with the patient and wife  reviewing all relevant studies completed to date and  lasting 15 to 20 minutes of a 25 minute visit    Each maintenance medication was reviewed in detail including most importantly the difference between maintenance and prns and under what circumstances the prns are to be triggered using an action plan format that is not reflected in the computer generated alphabetically organized AVS.    Please see instructions for details which were reviewed in writing and the patient given a copy highlighting the part that I personally wrote and discussed at today's ov.

## 2015-09-06 NOTE — Assessment & Plan Note (Signed)
Changed to bisoprolol 5 mg daily due to insurance restrictions  In meantime patient can continue to monitor his blood pressure and is fine if he doubles the bisoprolol pending follow-up here with PFTs as planned. Marland Kitchen

## 2015-09-06 NOTE — Telephone Encounter (Signed)
PA intitiated thru [employer] for Bystolic 10mg . Approved until 08/31/16 [id number]  Ref # [id number]. Pharmacy informed.

## 2015-09-28 ENCOUNTER — Telehealth: Payer: Self-pay | Admitting: Internal Medicine

## 2015-09-28 ENCOUNTER — Other Ambulatory Visit: Payer: Self-pay | Admitting: Internal Medicine

## 2015-09-28 MED ORDER — BISOPROLOL FUMARATE 5 MG PO TABS: 5.0000 mg | ORAL_TABLET | Freq: Every day | ORAL | Status: DC

## 2015-09-28 NOTE — Telephone Encounter (Signed)
Patient Instructions     Start symbicort 160 Take 2 puffs first thing in am and then another 2 puffs about 12 hours later.   Ok to try off in 2 weeks to see if makes if makes a difference.  Change bystolic to bisoprolol 5 mg daily - ok to take twice daily if not well controlled bp   Keep previous appt    Spoke with pt. He is needing a refill on Bisoprolol. This has been sent in. Nothing further was needed.

## 2015-10-02 ENCOUNTER — Telehealth: Payer: Self-pay | Admitting: *Deleted

## 2015-10-02 NOTE — Telephone Encounter (Signed)
initiated PA for Bisoprolol thru [employer].  [id number] Approved until 09/26/2016   Pharmacy informed.

## 2015-10-23 ENCOUNTER — Ambulatory Visit: Payer: Self-pay | Admitting: Internal Medicine

## 2015-10-26 ENCOUNTER — Telehealth: Payer: Self-pay | Admitting: Internal Medicine

## 2015-10-26 NOTE — Telephone Encounter (Signed)
Per 2.28.17 visit with MW: Patient Instructions       Start symbicort 160 Take 2 puffs first thing in am and then another 2 puffs about 12 hours later.   Ok to try off in 2 weeks to see if makes  if makes a difference.  Change bystolic to bisoprolol 5 mg daily - ok to take twice daily if not well controlled bp   Keep previous appt    Upcoming visit 5.10.17 w/ PFT the day prior LMOM TCB x1 to verify pt's message before sending rx

## 2015-10-27 MED ORDER — BUDESONIDE-FORMOTEROL FUMARATE 160-4.5 MCG/ACT IN AERO: 2.0000 | INHALATION_SPRAY | Freq: Two times a day (BID) | RESPIRATORY_TRACT | Status: DC

## 2015-10-27 NOTE — Telephone Encounter (Signed)
Spoke with pt.  He states that Symbicort has helped and wants a rx called in.  Pt tried off of Symbicort for 2 weeks but sob returned so pt started back on it.  Rx sent to pharmacy.

## 2015-10-27 NOTE — Telephone Encounter (Signed)
Left message for patient to call back  

## 2015-11-10 ENCOUNTER — Telehealth: Payer: Self-pay | Admitting: *Deleted

## 2015-11-10 NOTE — Telephone Encounter (Signed)
Initiated PA for Symbicort thru [employer]. Pt ID: [id number] T Approved from 11-10-15 to 11-04-2016. Pharmacy informed. [phone]

## 2015-11-15 ENCOUNTER — Ambulatory Visit: Payer: Self-pay | Admitting: Internal Medicine

## 2015-11-22 ENCOUNTER — Other Ambulatory Visit: Payer: Self-pay | Admitting: Internal Medicine

## 2015-11-24 ENCOUNTER — Encounter: Payer: Self-pay | Admitting: *Deleted

## 2015-12-01 ENCOUNTER — Ambulatory Visit (INDEPENDENT_AMBULATORY_CARE_PROVIDER_SITE_OTHER): Payer: Medicaid Other | Admitting: Cardiovascular Disease

## 2015-12-01 ENCOUNTER — Encounter: Payer: Self-pay | Admitting: Cardiovascular Disease

## 2015-12-01 VITALS — BP 146/80 | HR 76 | Ht 67.0 in | Wt 202.0 lb

## 2015-12-01 DIAGNOSIS — I429 Cardiomyopathy, unspecified: Secondary | ICD-10-CM | POA: Diagnosis not present

## 2015-12-01 DIAGNOSIS — I428 Other cardiomyopathies: Secondary | ICD-10-CM

## 2015-12-01 DIAGNOSIS — E785 Hyperlipidemia, unspecified: Secondary | ICD-10-CM | POA: Diagnosis not present

## 2015-12-01 DIAGNOSIS — R5383 Other fatigue: Secondary | ICD-10-CM

## 2015-12-01 DIAGNOSIS — I5032 Chronic diastolic (congestive) heart failure: Secondary | ICD-10-CM

## 2015-12-01 DIAGNOSIS — I10 Essential (primary) hypertension: Secondary | ICD-10-CM

## 2015-12-01 MED ORDER — ATORVASTATIN CALCIUM 20 MG PO TABS: 20.0000 mg | ORAL_TABLET | Freq: Every day | ORAL | Status: DC

## 2015-12-01 MED ORDER — BISOPROLOL FUMARATE 10 MG PO TABS: 10.0000 mg | ORAL_TABLET | ORAL | Status: DC

## 2015-12-01 MED ORDER — BISOPROLOL FUMARATE 5 MG PO TABS: 5.0000 mg | ORAL_TABLET | ORAL | Status: DC

## 2015-12-01 MED ORDER — BISOPROLOL FUMARATE 5 MG PO TABS: 5.0000 mg | ORAL_TABLET | Freq: Two times a day (BID) | ORAL | Status: DC
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
## 2015-12-01 NOTE — Patient Instructions (Addendum)
   Increase Bisoprolol to 10mg  every morning.   Stop Zocor (Simvastatin).  Begin Lipitor 20mg  daily.  New prescriptions sent to [facility] today. Continue all other medications.   Lab for Lipids - due in 3 months - will send reminder in mail when time. Please have Dr. Sherrie Sport send copies of labs from office visit next week.  Your physician wants you to follow up in: 6 months.  You will receive a reminder letter in the mail one-two months in advance.  If you don't receive a letter, please call our office to schedule the follow up appointment

## 2015-12-06 ENCOUNTER — Encounter: Payer: Self-pay | Admitting: *Deleted

## 2015-12-14 ENCOUNTER — Telehealth: Payer: Self-pay | Admitting: Cardiovascular Disease

## 2015-12-14 NOTE — Telephone Encounter (Signed)
Henry Bryant called stating that he cannot afford to pay for the Bisoprolol 10mg 

## 2015-12-14 NOTE — Telephone Encounter (Signed)
Patient informed that we are working on prior authorization form for him today ([place] Medicaid).

## 2015-12-18 NOTE — Telephone Encounter (Signed)
Pt says Bisoprolol 10 mg was still rejected - starting taking bystolic 10 mg again but only has 2-3 days of bystolic left since he has been out of bisoprolol.

## 2015-12-19 NOTE — Telephone Encounter (Signed)
Patient notified that Bisoprolol finally went through this morning.

## 2015-12-26 ENCOUNTER — Telehealth: Payer: Self-pay | Admitting: *Deleted

## 2015-12-26 NOTE — Telephone Encounter (Signed)
Patient calling with update on bp readings since increasing the Bisoprolol.  States the top number still running 140-169 & bottom number has been fine most of the time.

## 2015-12-27 MED ORDER — BISOPROLOL FUMARATE 10 MG PO TABS: 15.0000 mg | ORAL_TABLET | ORAL | Status: DC

## 2015-12-27 NOTE — Telephone Encounter (Signed)
Patient notified.  He will take 1 1/2 tabs of the 10mg  tablet that he already has.  Will have him continue to monitor his blood pressure & call office with readings in another 7-10 days.  If continues on this dose, he will need new prescription soon.  Patient has Medicaid so he will more than likely need another prior authorization done at that time.

## 2015-12-27 NOTE — Telephone Encounter (Signed)
Can increase to 15 mg daily.

## 2016-01-03 ENCOUNTER — Telehealth: Payer: Self-pay | Admitting: *Deleted

## 2016-01-03 MED ORDER — AMLODIPINE BESYLATE 5 MG PO TABS: 5.0000 mg | ORAL_TABLET | Freq: Every day | ORAL | Status: DC

## 2016-01-03 MED ORDER — BISOPROLOL FUMARATE 10 MG PO TABS: 15.0000 mg | ORAL_TABLET | ORAL | Status: DC

## 2016-01-03 NOTE — Telephone Encounter (Signed)
D/c minoxidil. Start amlodipine 5 mg daily.

## 2016-01-03 NOTE — Telephone Encounter (Signed)
Pt aware, amlodipine sent to pharmacy

## 2016-01-03 NOTE — Telephone Encounter (Signed)
Pt says increase of bisoprolol 15 mg BP 170s/80s since 6/20. This morning was 192/87. Will forward to provider

## 2016-01-10 ENCOUNTER — Other Ambulatory Visit: Payer: Self-pay | Admitting: *Deleted

## 2016-01-10 MED ORDER — BISOPROLOL FUMARATE 10 MG PO TABS: 15.0000 mg | ORAL_TABLET | ORAL | Status: DC

## 2016-01-22 ENCOUNTER — Ambulatory Visit: Payer: Self-pay | Admitting: Internal Medicine

## 2016-02-05 ENCOUNTER — Other Ambulatory Visit: Payer: Self-pay | Admitting: *Deleted

## 2016-02-05 DIAGNOSIS — E299 Testicular dysfunction, unspecified: Secondary | ICD-10-CM

## 2016-03-07 ENCOUNTER — Ambulatory Visit: Payer: Self-pay | Admitting: Internal Medicine

## 2016-03-07 ENCOUNTER — Ambulatory Visit: Payer: Self-pay | Admitting: Acute Care

## 2016-03-27 ENCOUNTER — Other Ambulatory Visit: Payer: Self-pay | Admitting: *Deleted

## 2016-03-27 ENCOUNTER — Encounter: Payer: Self-pay | Admitting: *Deleted

## 2016-03-27 DIAGNOSIS — E785 Hyperlipidemia, unspecified: Secondary | ICD-10-CM
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
## 2016-04-02 ENCOUNTER — Ambulatory Visit (INDEPENDENT_AMBULATORY_CARE_PROVIDER_SITE_OTHER): Payer: Medicaid Other | Admitting: Urology

## 2016-04-02 DIAGNOSIS — N486 Induration penis plastica: Secondary | ICD-10-CM | POA: Diagnosis not present

## 2016-05-01 ENCOUNTER — Encounter: Payer: Self-pay | Admitting: *Deleted

## 2016-05-16 ENCOUNTER — Telehealth: Payer: Self-pay | Admitting: Cardiovascular Disease

## 2016-05-16 NOTE — Telephone Encounter (Signed)
Patient going to [facility] to have labs done tomorrow  He misplaced lab work and would like it faxed over

## 2016-05-16 NOTE — Telephone Encounter (Signed)
Order faxed to [facility] now.

## 2016-05-20 ENCOUNTER — Ambulatory Visit: Payer: Self-pay | Admitting: Cardiovascular Disease

## 2016-06-06 ENCOUNTER — Encounter: Payer: Self-pay | Admitting: *Deleted

## 2016-06-12 ENCOUNTER — Encounter: Payer: Self-pay | Admitting: *Deleted

## 2016-06-13 ENCOUNTER — Ambulatory Visit: Payer: Self-pay | Admitting: Cardiovascular Disease

## 2016-06-18 ENCOUNTER — Encounter: Payer: Self-pay | Admitting: *Deleted

## 2016-06-22 ENCOUNTER — Other Ambulatory Visit: Payer: Self-pay | Admitting: Internal Medicine

## 2016-06-25 ENCOUNTER — Telehealth: Payer: Self-pay | Admitting: Cardiovascular Disease

## 2016-06-25 MED ORDER — OLMESARTAN MEDOXOMIL-HCTZ 40-12.5 MG PO TABS: 1.0000 | ORAL_TABLET | Freq: Every day | ORAL | 1 refills | Status: DC

## 2016-06-25 NOTE — Telephone Encounter (Signed)
Medication sent to pharmacy  

## 2016-06-25 NOTE — Telephone Encounter (Signed)
That would be fine 

## 2016-06-25 NOTE — Telephone Encounter (Signed)
Henry Bryant called stating that he is no longer seeing Dr. Melvyn Novas and he needs to see Dr. Bronson Ing will fill his olmesartan-hydrochlorothiazide (BENICAR HCT) 40-12.5 MG

## 2016-06-25 NOTE — Telephone Encounter (Signed)
Pt has appt with Dr. Bronson Ing 07/10/16 will route if ok to refill

## 2016-07-09 ENCOUNTER — Ambulatory Visit (INDEPENDENT_AMBULATORY_CARE_PROVIDER_SITE_OTHER): Payer: Medicaid Other | Admitting: Cardiovascular Disease

## 2016-07-09 ENCOUNTER — Encounter: Payer: Self-pay | Admitting: Cardiovascular Disease

## 2016-07-09 VITALS — BP 168/100 | HR 111 | Ht 70.0 in | Wt 192.0 lb

## 2016-07-09 DIAGNOSIS — J209 Acute bronchitis, unspecified: Secondary | ICD-10-CM

## 2016-07-09 DIAGNOSIS — E7800 Pure hypercholesterolemia, unspecified: Secondary | ICD-10-CM

## 2016-07-09 DIAGNOSIS — I10 Essential (primary) hypertension: Secondary | ICD-10-CM

## 2016-07-09 DIAGNOSIS — I5032 Chronic diastolic (congestive) heart failure: Secondary | ICD-10-CM

## 2016-07-09 DIAGNOSIS — I428 Other cardiomyopathies: Secondary | ICD-10-CM

## 2016-07-09 MED ORDER — AMLODIPINE BESYLATE 10 MG PO TABS: 10.0000 mg | ORAL_TABLET | Freq: Every day | ORAL | 6 refills | Status: DC

## 2016-07-09 MED ORDER — PREDNISONE 5 MG PO TABS: 5.0000 mg | ORAL_TABLET | Freq: Every day | ORAL | 0 refills | Status: AC
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
## 2016-07-09 NOTE — Patient Instructions (Signed)
Medication Instructions:   Increase Amlodipine to 10mg  daily  Begin Prednisone 5mg  daily x 5 days only.  Continue all other medications.    Labwork: none  Testing/Procedures: none  Follow-Up: Your physician wants you to follow up in: 6 months.  You will receive a reminder letter in the mail one-two months in advance.  If you don't receive a letter, please call our office to schedule the follow up appointment   Any Other Special Instructions Will Be Listed Below (If Applicable).  If you need a refill on your cardiac medications before your next appointment, please call your pharmacy.

## 2016-07-10 ENCOUNTER — Ambulatory Visit: Payer: Self-pay | Admitting: Cardiovascular Disease

## 2016-07-27 ENCOUNTER — Other Ambulatory Visit: Payer: Self-pay | Admitting: Cardiovascular Disease

## 2016-09-26 ENCOUNTER — Ambulatory Visit (INDEPENDENT_AMBULATORY_CARE_PROVIDER_SITE_OTHER): Payer: Self-pay | Admitting: Otolaryngology

## 2016-11-18 DIAGNOSIS — R490 Dysphonia: Secondary | ICD-10-CM | POA: Insufficient documentation

## 2016-12-25 ENCOUNTER — Other Ambulatory Visit: Payer: Self-pay | Admitting: Cardiovascular Disease

## 2017-01-15 ENCOUNTER — Ambulatory Visit: Payer: Medicaid Other | Admitting: Cardiovascular Disease

## 2017-01-16 ENCOUNTER — Encounter: Payer: Self-pay | Admitting: Cardiovascular Disease

## 2017-01-28 ENCOUNTER — Ambulatory Visit: Payer: Self-pay | Admitting: Internal Medicine

## 2017-02-11 ENCOUNTER — Ambulatory Visit: Payer: Medicaid Other | Admitting: Cardiovascular Disease

## 2017-02-11 ENCOUNTER — Encounter: Payer: Self-pay | Admitting: Cardiovascular Disease

## 2017-03-06 ENCOUNTER — Ambulatory Visit: Payer: Medicaid Other | Admitting: Cardiovascular Disease

## 2017-03-06 ENCOUNTER — Encounter: Payer: Self-pay | Admitting: Cardiovascular Disease

## 2017-03-19 ENCOUNTER — Other Ambulatory Visit: Payer: Self-pay | Admitting: Cardiovascular Disease

## 2017-04-17 ENCOUNTER — Ambulatory Visit (INDEPENDENT_AMBULATORY_CARE_PROVIDER_SITE_OTHER): Payer: Medicaid Other | Admitting: Cardiovascular Disease

## 2017-04-17 ENCOUNTER — Encounter: Payer: Self-pay | Admitting: Cardiovascular Disease

## 2017-04-17 VITALS — BP 152/84 | HR 77 | Ht 70.0 in | Wt 191.8 lb

## 2017-04-17 DIAGNOSIS — E7800 Pure hypercholesterolemia, unspecified: Secondary | ICD-10-CM

## 2017-04-17 DIAGNOSIS — I10 Essential (primary) hypertension: Secondary | ICD-10-CM | POA: Diagnosis not present

## 2017-04-17 DIAGNOSIS — R200 Anesthesia of skin: Secondary | ICD-10-CM

## 2017-04-17 DIAGNOSIS — I428 Other cardiomyopathies: Secondary | ICD-10-CM | POA: Diagnosis not present

## 2017-04-17 DIAGNOSIS — R202 Paresthesia of skin: Secondary | ICD-10-CM
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
## 2017-04-17 NOTE — Patient Instructions (Addendum)
Your physician wants you to follow-up in: 6 months with Dr.Koneswaran You will receive a reminder letter in the mail two months in advance. If you don't receive a letter, please call our office to schedule the follow-up appointment.     STOP Lasix  STOP Potassium  STOP Amlodipine  STOP Bisoprolol   Record BP 4 times a week for a month, you may drop log off for MD to review      No testing ordered today.      Thank you for choosing [facility] !

## 2017-04-17 NOTE — Addendum Note (Signed)
Addended by: Barbarann Ehlers A on: 04/17/2017 08:47 AM   Modules accepted: Orders

## 2017-08-20 ENCOUNTER — Other Ambulatory Visit: Payer: Self-pay | Admitting: Cardiovascular Disease

## 2017-08-25 ENCOUNTER — Other Ambulatory Visit: Payer: Self-pay

## 2017-08-25 MED ORDER — OLMESARTAN MEDOXOMIL-HCTZ 40-12.5 MG PO TABS: 1.0000 | ORAL_TABLET | Freq: Every day | ORAL | 0 refills | Status: DC

## 2017-10-09 ENCOUNTER — Encounter: Payer: Self-pay | Admitting: *Deleted

## 2017-10-09 ENCOUNTER — Encounter: Payer: Self-pay | Admitting: Cardiovascular Disease

## 2017-10-09 ENCOUNTER — Ambulatory Visit (INDEPENDENT_AMBULATORY_CARE_PROVIDER_SITE_OTHER): Payer: Medicaid Other | Admitting: Cardiovascular Disease

## 2017-10-09 VITALS — BP 128/72 | HR 74 | Ht 70.0 in | Wt 198.0 lb

## 2017-10-09 DIAGNOSIS — M79605 Pain in left leg: Secondary | ICD-10-CM

## 2017-10-09 DIAGNOSIS — I10 Essential (primary) hypertension: Secondary | ICD-10-CM

## 2017-10-09 DIAGNOSIS — E119 Type 2 diabetes mellitus without complications: Secondary | ICD-10-CM | POA: Diagnosis not present

## 2017-10-09 DIAGNOSIS — Z9289 Personal history of other medical treatment: Secondary | ICD-10-CM

## 2017-10-09 DIAGNOSIS — I5032 Chronic diastolic (congestive) heart failure: Secondary | ICD-10-CM | POA: Diagnosis not present

## 2017-10-09 DIAGNOSIS — F17201 Nicotine dependence, unspecified, in remission: Secondary | ICD-10-CM

## 2017-10-09 DIAGNOSIS — I428 Other cardiomyopathies: Secondary | ICD-10-CM | POA: Diagnosis not present

## 2017-10-09 DIAGNOSIS — M79604 Pain in right leg: Secondary | ICD-10-CM | POA: Diagnosis not present

## 2017-10-09 DIAGNOSIS — I739 Peripheral vascular disease, unspecified: Secondary | ICD-10-CM | POA: Diagnosis not present

## 2017-10-09 DIAGNOSIS — R202 Paresthesia of skin: Secondary | ICD-10-CM

## 2017-10-09 DIAGNOSIS — R200 Anesthesia of skin: Secondary | ICD-10-CM
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
## 2017-10-09 NOTE — Patient Instructions (Signed)
Medication Instructions:  Your physician recommends that you continue on your current medications as directed. Please refer to the Current Medication list given to you today.  Labwork: NONE   Testing/Procedures: Your physician has requested that you have an ankle brachial index (ABI). During this test an ultrasound and blood pressure cuff are used to evaluate the arteries that supply the arms and legs with blood. Allow thirty minutes for this exam. There are no restrictions or special instructions.  Follow-Up: Your physician recommends that you schedule a follow-up appointment in: 2 [address] Bronson Ing   Any Other Special Instructions Will Be Listed Below (If Applicable).  If you need a refill on your cardiac medications before your next appointment, please call your pharmacy.

## 2017-10-10 NOTE — Addendum Note (Signed)
Addended by: Acquanetta Chain on: 10/10/2017 11:52 AM   Modules accepted: Orders

## 2017-11-05 ENCOUNTER — Encounter

## 2017-11-05 ENCOUNTER — Other Ambulatory Visit: Payer: Medicaid Other

## 2017-12-10 ENCOUNTER — Telehealth: Payer: Self-pay | Admitting: Cardiovascular Disease

## 2017-12-10 NOTE — Telephone Encounter (Signed)
Patient wanted to know if appt with Dr Bronson Ing should be rescheduled since he did not have his test done that Dr Bronson Ing ordered

## 2017-12-10 NOTE — Telephone Encounter (Signed)
Yes, reschedule appt with me until completed.

## 2017-12-12 ENCOUNTER — Telehealth: Payer: Self-pay | Admitting: *Deleted

## 2017-12-12 ENCOUNTER — Ambulatory Visit: Payer: Self-pay | Admitting: Cardiovascular Disease

## 2017-12-12 NOTE — Telephone Encounter (Signed)
[facility] has Benicar on back order and wanted to know if ok for pt to have olmesartan 40 mg and   hctz 12.5 mg separately

## 2017-12-12 NOTE — Telephone Encounter (Signed)
Henry Bryant) made aware - only 1 refill given - pt needs appt for further refills - scheduling aware of this and will call pt for appt

## 2017-12-12 NOTE — Telephone Encounter (Signed)
That would be fine 

## 2017-12-22 ENCOUNTER — Telehealth: Payer: Self-pay | Admitting: Cardiovascular Disease

## 2017-12-22 NOTE — Telephone Encounter (Signed)
Medication needs a prior authorization. Pharmacy faxing over

## 2017-12-22 NOTE — Telephone Encounter (Signed)
Pharmacy telling him that can not get RX due to not approving

## 2017-12-31 ENCOUNTER — Other Ambulatory Visit: Payer: Self-pay | Admitting: Cardiovascular Disease

## 2017-12-31 DIAGNOSIS — I739 Peripheral vascular disease, unspecified: Secondary | ICD-10-CM

## 2018-01-21 ENCOUNTER — Ambulatory Visit: Payer: Medicaid Other

## 2018-01-21 ENCOUNTER — Other Ambulatory Visit: Payer: Self-pay

## 2018-01-21 ENCOUNTER — Ambulatory Visit (INDEPENDENT_AMBULATORY_CARE_PROVIDER_SITE_OTHER): Payer: Medicaid Other

## 2018-01-21 DIAGNOSIS — I739 Peripheral vascular disease, unspecified: Secondary | ICD-10-CM | POA: Diagnosis not present

## 2018-01-21 DIAGNOSIS — I428 Other cardiomyopathies: Secondary | ICD-10-CM | POA: Diagnosis not present

## 2018-01-23 ENCOUNTER — Telehealth: Payer: Self-pay | Admitting: *Deleted

## 2018-01-23 NOTE — Telephone Encounter (Signed)
Notes recorded by Laurine Blazer, LPN on 7/49/4496 at 7:59 PM EDT Patient notified. Copy to pmd. Follow up scheduled for September in [place] office. ------  Notes recorded by Herminio Commons, MD on 01/22/2018 at 8:50 AM EDT Pumping function was in the low normal range. The aorta was mildly enlarged.

## 2018-02-13 ENCOUNTER — Ambulatory Visit: Payer: Self-pay | Admitting: Internal Medicine

## 2018-02-16 ENCOUNTER — Telehealth: Payer: Self-pay | Admitting: Cardiovascular Disease

## 2018-02-16 MED ORDER — OLMESARTAN MEDOXOMIL-HCTZ 40-12.5 MG PO TABS: 1.0000 | ORAL_TABLET | Freq: Every day | ORAL | 3 refills | Status: DC

## 2018-02-16 NOTE — Telephone Encounter (Signed)
Done

## 2018-02-16 NOTE — Telephone Encounter (Signed)
°*  STAT* If patient is at the pharmacy, call can be transferred to refill team.   1. Which medications need to be refilled?  olmesartan-hydrochlorothiazide (BENICAR HCT) 40-12.5 MG tablet    2. Which pharmacy/location (including street and city if local pharmacy) is medication to be sent to? [facility]  3. Do they need a 30 day or 90 day supply? [place]
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
## 2018-03-24 ENCOUNTER — Ambulatory Visit: Payer: Medicaid Other | Admitting: Student

## 2018-03-30 ENCOUNTER — Telehealth: Payer: Self-pay | Admitting: Cardiovascular Disease

## 2018-03-30 MED ORDER — PANTOPRAZOLE SODIUM 40 MG PO TBEC: 40.0000 mg | DELAYED_RELEASE_TABLET | Freq: Every day | ORAL | 1 refills | Status: DC

## 2018-03-30 NOTE — Telephone Encounter (Signed)
Pt aware - medication sent to [facility] as requested

## 2018-03-30 NOTE — Telephone Encounter (Signed)
Patient called because he missed appointment with Ahmed Prima due to death in family. He has been experiencing bad heart burn that will not go away.  He is concerned due to past symptoms

## 2018-03-30 NOTE — Telephone Encounter (Signed)
Start Protonix 40 mg daily

## 2018-03-30 NOTE — Telephone Encounter (Signed)
Pt c/o heart burn for the last 2 months - says has chest pain 2-3 times weekly lasting only a few seconds - denies any SOB/dizziness/swelling - says he has tried Pepcid but this doesn't help - has appt with Dr Bronson Ing 10/7

## 2018-04-13 ENCOUNTER — Ambulatory Visit: Payer: Self-pay | Admitting: Cardiovascular Disease

## 2018-06-05 ENCOUNTER — Other Ambulatory Visit: Payer: Self-pay | Admitting: Cardiovascular Disease

## 2018-07-17 ENCOUNTER — Telehealth: Payer: Self-pay | Admitting: Cardiovascular Disease

## 2018-07-17 MED ORDER — PANTOPRAZOLE SODIUM 40 MG PO TBEC: 40.0000 mg | DELAYED_RELEASE_TABLET | Freq: Every day | ORAL | 2 refills | Status: AC

## 2018-07-17 NOTE — Telephone Encounter (Signed)
°*  STAT* If patient is at the pharmacy, call can be transferred to refill team.   1. Which medications need to be refilled?  pantoprazole (PROTONIX) 40 MG tablet    2. Which pharmacy/location (including street and city if local pharmacy) is medication to be sent to? [facility]  3. Do they need a 30 day or 90 day supply?

## 2018-07-17 NOTE — Telephone Encounter (Signed)
Done

## 2018-12-02 ENCOUNTER — Encounter: Payer: Self-pay | Admitting: Internal Medicine

## 2018-12-02 ENCOUNTER — Telehealth: Payer: Self-pay | Admitting: Emergency Medicine

## 2018-12-02 DIAGNOSIS — I429 Cardiomyopathy, unspecified: Secondary | ICD-10-CM | POA: Insufficient documentation

## 2018-12-02 DIAGNOSIS — I2510 Atherosclerotic heart disease of native coronary artery without angina pectoris: Secondary | ICD-10-CM | POA: Insufficient documentation

## 2018-12-02 NOTE — Telephone Encounter (Signed)
Henry Bryant with me but must be ok with current PCP

## 2018-12-02 NOTE — Telephone Encounter (Signed)
Pt's wife is a current Pt of yours. Wife would like to know if you would accept husband as a patient so they can see the same doctor. Please advise.

## 2018-12-03 NOTE — Telephone Encounter (Deleted)
Pt would like to transfer to same office that wife is a patient at, are you okay with this?

## 2018-12-08 NOTE — Telephone Encounter (Signed)
Spoke with pt's wife. Pt has medicaid as Chartered certified accountant. Advised pt when his Medicare gets approved and if it is primary, they can call back to schedule appt.

## 2018-12-09 ENCOUNTER — Ambulatory Visit: Payer: Medicaid Other | Admitting: Internal Medicine

## 2019-01-18 ENCOUNTER — Other Ambulatory Visit: Payer: Self-pay | Admitting: *Deleted

## 2019-01-18 MED ORDER — OLMESARTAN MEDOXOMIL-HCTZ 40-12.5 MG PO TABS: 1.0000 | ORAL_TABLET | Freq: Every day | ORAL | 0 refills | Status: DC

## 2019-02-26 ENCOUNTER — Other Ambulatory Visit: Payer: Self-pay

## 2019-02-26 DIAGNOSIS — Z20822 Contact with and (suspected) exposure to covid-19: Secondary | ICD-10-CM

## 2019-02-28 LAB — NOVEL CORONAVIRUS, NAA: SARS-CoV-2, NAA: NOT DETECTED

## 2019-03-16 ENCOUNTER — Other Ambulatory Visit: Payer: Self-pay

## 2019-03-16 DIAGNOSIS — Z20822 Contact with and (suspected) exposure to covid-19: Secondary | ICD-10-CM

## 2019-03-18 LAB — NOVEL CORONAVIRUS, NAA: SARS-CoV-2, NAA: NOT DETECTED

## 2019-04-08 ENCOUNTER — Telehealth: Payer: Medicaid Other | Admitting: Cardiovascular Disease

## 2019-04-29 ENCOUNTER — Telehealth: Payer: Medicaid Other | Admitting: Cardiovascular Disease

## 2019-07-19 DIAGNOSIS — I484 Atypical atrial flutter: Secondary | ICD-10-CM | POA: Insufficient documentation

## 2019-07-28 DIAGNOSIS — I5020 Unspecified systolic (congestive) heart failure: Secondary | ICD-10-CM | POA: Insufficient documentation

## 2019-07-28 DIAGNOSIS — I5022 Chronic systolic (congestive) heart failure: Secondary | ICD-10-CM | POA: Insufficient documentation

## 2020-03-31 DIAGNOSIS — E11621 Type 2 diabetes mellitus with foot ulcer: Secondary | ICD-10-CM | POA: Insufficient documentation

## 2020-04-03 ENCOUNTER — Ambulatory Visit: Payer: Self-pay | Admitting: "Endocrinology

## 2022-09-23 DIAGNOSIS — E119 Type 2 diabetes mellitus without complications: Secondary | ICD-10-CM | POA: Insufficient documentation

## 2022-09-23 DIAGNOSIS — R54 Age-related physical debility: Secondary | ICD-10-CM | POA: Insufficient documentation

## 2023-04-09 NOTE — Progress Notes (Signed)
Update medical history and medications.

## 2023-04-11 ENCOUNTER — Ambulatory Visit: Payer: Medicaid Other | Admitting: Internal Medicine
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
## 2023-04-17 ENCOUNTER — Encounter: Payer: Medicaid Other | Admitting: Vascular Surgery
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
## 2023-05-09 ENCOUNTER — Ambulatory Visit: Payer: Medicaid Other | Admitting: Vascular Surgery

## 2023-05-09 ENCOUNTER — Encounter: Payer: Self-pay | Admitting: Vascular Surgery

## 2023-05-09 VITALS — BP 130/84 | HR 78 | Temp 97.8°F | Resp 20 | Ht 70.0 in | Wt 172.0 lb

## 2023-05-09 DIAGNOSIS — I10 Essential (primary) hypertension: Secondary | ICD-10-CM | POA: Diagnosis not present

## 2023-05-09 DIAGNOSIS — I5020 Unspecified systolic (congestive) heart failure: Secondary | ICD-10-CM

## 2023-05-09 DIAGNOSIS — I739 Peripheral vascular disease, unspecified: Secondary | ICD-10-CM | POA: Diagnosis not present

## 2023-05-14 ENCOUNTER — Other Ambulatory Visit: Payer: Self-pay

## 2023-05-14 DIAGNOSIS — I739 Peripheral vascular disease, unspecified: Secondary | ICD-10-CM

## 2023-05-22 ENCOUNTER — Ambulatory Visit: Payer: Self-pay | Admitting: Internal Medicine

## 2023-05-26 ENCOUNTER — Encounter (HOSPITAL_COMMUNITY): Admission: RE | Payer: Self-pay | Source: Home / Self Care

## 2023-05-26 ENCOUNTER — Ambulatory Visit (HOSPITAL_COMMUNITY): Admission: RE | Admit: 2023-05-26 | Payer: Medicaid Other | Source: Home / Self Care | Admitting: Vascular Surgery

## 2023-05-26 ENCOUNTER — Telehealth: Payer: Self-pay

## 2023-05-26 SURGERY — ABDOMINAL AORTOGRAM W/LOWER EXTREMITY
Anesthesia: LOCAL

## 2023-05-26 NOTE — Pre-Procedure Instructions (Signed)
Patient didn't show today for procedure.  Called his he states the person who called him about his prescriptions was suppost to let use know that his wife was having surgery this am and he was going to have to reschedule.  Will notify MD and office.

## 2023-05-26 NOTE — Telephone Encounter (Signed)
Received notification from J. O'Neal, RN that patient canceled aortogram procedure on today due to his wife having surgery today as well. Our office will follow up with patient regarding rescheduling.

## 2023-05-28 NOTE — Telephone Encounter (Signed)
Spoke with patient who requested to reschedule procedure in December. Offered date of 12/2 and pt was agreeable. Instructions reviewed and will be mailed to patient. He verbalized understanding to all information provided.

## 2023-06-09 ENCOUNTER — Ambulatory Visit (HOSPITAL_COMMUNITY): Admission: RE | Admit: 2023-06-09 | Payer: Medicaid Other | Source: Home / Self Care | Admitting: Vascular Surgery

## 2023-06-09 ENCOUNTER — Encounter (HOSPITAL_COMMUNITY): Admission: RE | Payer: Self-pay | Source: Home / Self Care

## 2023-06-09 DIAGNOSIS — I739 Peripheral vascular disease, unspecified: Secondary | ICD-10-CM

## 2023-06-09 SURGERY — ABDOMINAL AORTOGRAM W/LOWER EXTREMITY
Anesthesia: LOCAL

## 2023-06-09 NOTE — Progress Notes (Signed)
Patient didn't show for procedure.  Called spoke with patient and wife, they had called the office.  Pt states not able to come for procedure today.

## 2023-06-10 ENCOUNTER — Telehealth: Payer: Self-pay

## 2023-06-10 NOTE — Telephone Encounter (Signed)
Patient called the office after-hours line on 11/30, stating he was cancelling aortogram procedure for 12/2. Attempted to reach patient to reschedule procedure. Left VM for patient to return call.

## 2023-06-11 ENCOUNTER — Encounter: Payer: Self-pay | Admitting: Internal Medicine

## 2023-06-11 NOTE — Progress Notes (Signed)
Erroneous encounter - please disregard.

## 2023-06-13 DIAGNOSIS — I5020 Unspecified systolic (congestive) heart failure: Secondary | ICD-10-CM | POA: Insufficient documentation

## 2023-06-16 NOTE — Telephone Encounter (Signed)
Left VM for patient to return call

## 2023-06-17 ENCOUNTER — Other Ambulatory Visit: Payer: Self-pay

## 2023-06-17 DIAGNOSIS — I739 Peripheral vascular disease, unspecified: Secondary | ICD-10-CM

## 2023-06-17 NOTE — Telephone Encounter (Signed)
Patient returned call this morning to reschedule aortogram, if possible on 12/23 or afterwards. Procedure scheduled for 12/23- instructions reviewed and patient verbalized understanding.

## 2023-06-17 NOTE — Addendum Note (Signed)
Addended by: Primitivo Gauze on: 06/17/2023 08:33 AM   Modules accepted: Orders

## 2023-06-26 DIAGNOSIS — E11621 Type 2 diabetes mellitus with foot ulcer: Secondary | ICD-10-CM | POA: Insufficient documentation

## 2023-06-30 ENCOUNTER — Telehealth: Payer: Self-pay

## 2023-06-30 NOTE — Telephone Encounter (Signed)
Received notification from Sherlynn Stalls., RN at [facility] lab that patient no showed for appointment and that patient reports he just got d/c from [facility] and he wanted to reschedule for next Monday 12/30. Case was moved and told him to be here at 0630 AM.   Spoke with patient to review additional information regarding medications- patient verbalized understanding to stop Eliquis on 12/27, how to adjust insulin and NPO status.

## 2023-07-07 ENCOUNTER — Ambulatory Visit (HOSPITAL_COMMUNITY)
Admission: RE | Admit: 2023-07-07 | Discharge: 2023-07-07 | Disposition: A | Discharge status: Home / Self Care | Payer: Medicaid Other | Attending: Vascular Surgery | Admitting: Vascular Surgery

## 2023-07-07 ENCOUNTER — Encounter (HOSPITAL_COMMUNITY)
Admission: RE | Disposition: A | Discharge status: Home / Self Care | Payer: Self-pay | Source: Home / Self Care | Attending: Vascular Surgery

## 2023-07-07 ENCOUNTER — Other Ambulatory Visit: Payer: Self-pay

## 2023-07-07 DIAGNOSIS — L97519 Non-pressure chronic ulcer of other part of right foot with unspecified severity: Secondary | ICD-10-CM | POA: Insufficient documentation

## 2023-07-07 DIAGNOSIS — I509 Heart failure, unspecified: Secondary | ICD-10-CM | POA: Diagnosis not present

## 2023-07-07 DIAGNOSIS — Z7982 Long term (current) use of aspirin: Secondary | ICD-10-CM | POA: Diagnosis not present

## 2023-07-07 DIAGNOSIS — Z87891 Personal history of nicotine dependence: Secondary | ICD-10-CM | POA: Insufficient documentation

## 2023-07-07 DIAGNOSIS — I70245 Atherosclerosis of native arteries of left leg with ulceration of other part of foot: Secondary | ICD-10-CM | POA: Diagnosis not present

## 2023-07-07 DIAGNOSIS — I4892 Unspecified atrial flutter: Secondary | ICD-10-CM | POA: Diagnosis not present

## 2023-07-07 DIAGNOSIS — Z7901 Long term (current) use of anticoagulants: Secondary | ICD-10-CM | POA: Diagnosis not present

## 2023-07-07 DIAGNOSIS — I110 Hypertensive heart disease with heart failure: Secondary | ICD-10-CM | POA: Insufficient documentation

## 2023-07-07 DIAGNOSIS — I739 Peripheral vascular disease, unspecified: Secondary | ICD-10-CM

## 2023-07-07 DIAGNOSIS — Z79899 Other long term (current) drug therapy: Secondary | ICD-10-CM | POA: Diagnosis not present

## 2023-07-07 DIAGNOSIS — I70235 Atherosclerosis of native arteries of right leg with ulceration of other part of foot: Secondary | ICD-10-CM | POA: Insufficient documentation

## 2023-07-07 HISTORY — PX: PERIPHERAL VASCULAR BALLOON ANGIOPLASTY: CATH118281

## 2023-07-07 HISTORY — PX: ABDOMINAL AORTOGRAM W/LOWER EXTREMITY: CATH118223

## 2023-07-07 LAB — POCT I-STAT, CHEM 8
BUN: 28 mg/dL — ABNORMAL HIGH (ref 8–23)
Calcium, Ion: 1.1 mmol/L — ABNORMAL LOW (ref 1.15–1.40)
Chloride: 109 mmol/L (ref 98–111)
Creatinine, Ser: 2 mg/dL — ABNORMAL HIGH (ref 0.61–1.24)
Glucose, Bld: 131 mg/dL — ABNORMAL HIGH (ref 70–99)
HCT: 32 % — ABNORMAL LOW (ref 39.0–52.0)
Hemoglobin: 10.9 g/dL — ABNORMAL LOW (ref 13.0–17.0)
Potassium: 4.3 mmol/L (ref 3.5–5.1)
Sodium: 142 mmol/L (ref 135–145)
TCO2: 22 mmol/L (ref 22–32)

## 2023-07-07 LAB — GLUCOSE, CAPILLARY
Glucose-Capillary: 153 mg/dL — ABNORMAL HIGH (ref 70–99)
Glucose-Capillary: 128 mg/dL — ABNORMAL HIGH (ref 70–99)

## 2023-07-07 SURGERY — ABDOMINAL AORTOGRAM W/LOWER EXTREMITY
Anesthesia: LOCAL

## 2023-07-07 MED ORDER — FENTANYL CITRATE (PF) 100 MCG/2ML IJ SOLN
INTRAMUSCULAR | Status: DC | PRN
  Administered 2023-07-07: 50 ug via INTRAVENOUS

## 2023-07-07 MED ORDER — CLOPIDOGREL BISULFATE 75 MG PO TABS: 75.0000 mg | ORAL_TABLET | Freq: Every day | ORAL | 0 refills | Status: AC

## 2023-07-07 MED ORDER — ONDANSETRON HCL 4 MG/2ML IJ SOLN
4.0000 mg | Freq: Four times a day (QID) | INTRAMUSCULAR | Status: DC | PRN
Start: 2023-07-07 — End: 2023-07-07

## 2023-07-07 MED ORDER — LIDOCAINE HCL (PF) 1 % IJ SOLN
INTRAMUSCULAR | Status: AC
  Filled 2023-07-07: qty 30

## 2023-07-07 MED ORDER — HEPARIN SODIUM (PORCINE) 1000 UNIT/ML IJ SOLN
INTRAMUSCULAR | Status: AC
  Filled 2023-07-07: qty 10

## 2023-07-07 MED ORDER — SODIUM CHLORIDE 0.9 % IV SOLN: 250.0000 mL | INTRAVENOUS | Status: DC | PRN

## 2023-07-07 MED ORDER — CLOPIDOGREL BISULFATE 300 MG PO TABS
ORAL_TABLET | ORAL | Status: DC | PRN
  Administered 2023-07-07: 300 mg via ORAL

## 2023-07-07 MED ORDER — SODIUM CHLORIDE 0.9 % WEIGHT BASED INFUSION: 1.0000 mL/kg/h | INTRAVENOUS | Status: DC

## 2023-07-07 MED ORDER — HYDRALAZINE HCL 20 MG/ML IJ SOLN: 5.0000 mg | INTRAMUSCULAR | Status: DC | PRN

## 2023-07-07 MED ORDER — MIDAZOLAM HCL 2 MG/2ML IJ SOLN
INTRAMUSCULAR | Status: DC | PRN
  Administered 2023-07-07: 1 mg via INTRAVENOUS

## 2023-07-07 MED ORDER — SODIUM CHLORIDE 0.9% FLUSH: 3.0000 mL | INTRAVENOUS | Status: DC | PRN

## 2023-07-07 MED ORDER — IODIXANOL 320 MG/ML IV SOLN
INTRAVENOUS | Status: DC | PRN
  Administered 2023-07-07: 18 mL via INTRA_ARTERIAL

## 2023-07-07 MED ORDER — FENTANYL CITRATE (PF) 100 MCG/2ML IJ SOLN
INTRAMUSCULAR | Status: AC
  Filled 2023-07-07: qty 2

## 2023-07-07 MED ORDER — CLOPIDOGREL BISULFATE 300 MG PO TABS
ORAL_TABLET | ORAL | Status: AC
  Filled 2023-07-07: qty 1

## 2023-07-07 MED ORDER — ACETAMINOPHEN 325 MG PO TABS: 650.0000 mg | ORAL_TABLET | ORAL | Status: DC | PRN

## 2023-07-07 MED ORDER — LABETALOL HCL 5 MG/ML IV SOLN: 10.0000 mg | INTRAVENOUS | Status: DC | PRN

## 2023-07-07 MED ORDER — LIDOCAINE HCL (PF) 1 % IJ SOLN
INTRAMUSCULAR | Status: DC | PRN
  Administered 2023-07-07: 15 mL

## 2023-07-07 MED ORDER — SODIUM CHLORIDE 0.9 % IV SOLN: INTRAVENOUS | Status: DC

## 2023-07-07 MED ORDER — HEPARIN (PORCINE) IN NACL 1000-0.9 UT/500ML-% IV SOLN
INTRAVENOUS | Status: DC | PRN
  Administered 2023-07-07: 500 mL

## 2023-07-07 MED ORDER — HEPARIN SODIUM (PORCINE) 1000 UNIT/ML IJ SOLN
INTRAMUSCULAR | Status: DC | PRN
  Administered 2023-07-07: 6000 [IU] via INTRAVENOUS

## 2023-07-07 MED ORDER — MIDAZOLAM HCL 2 MG/2ML IJ SOLN
INTRAMUSCULAR | Status: AC
  Filled 2023-07-07: qty 2

## 2023-07-07 MED ORDER — SODIUM CHLORIDE 0.9% FLUSH: 3.0000 mL | Freq: Two times a day (BID) | INTRAVENOUS | Status: DC

## 2023-07-07 MED ORDER — ASPIRIN 81 MG PO TBEC
81.0000 mg | DELAYED_RELEASE_TABLET | Freq: Every day | ORAL | Status: DC
  Administered 2023-07-07: 81 mg via ORAL
  Filled 2023-07-07: qty 1

## 2023-07-07 SURGICAL SUPPLY — 18 items
BALLN STERLING OTW 3X220X150 (BALLOONS) ×2 IMPLANT
BALLOON STERLING OTW 3X220X150 (BALLOONS) IMPLANT
CATH CXI SUPP 2.6F 150 ST (CATHETERS) IMPLANT
CATH OMNI FLUSH 5F 65CM (CATHETERS) IMPLANT
CATH QUICKCROSS SUPP .035X90CM (MICROCATHETER) IMPLANT
CLOSURE MYNX CONTROL 5F (Vascular Products) IMPLANT
DCB RANGER 4.0X60 135 (BALLOONS) IMPLANT
GLIDEWIRE ADV .035X260CM (WIRE) IMPLANT
KIT ANGIASSIST CO2 SYSTEM (KITS) IMPLANT
KIT ENCORE 26 ADVANTAGE (KITS) IMPLANT
KIT MICROPUNCTURE NIT STIFF (SHEATH) IMPLANT
RANGER DCB 4.0X60 135 (BALLOONS) ×2 IMPLANT
SHEATH PINNACLE 5F 10CM (SHEATH) IMPLANT
SHEATH PROBE COVER 6X72 (BAG) IMPLANT
SHEATH SHUTTLE 5F/110 (SHEATH) IMPLANT
TRAY PV CATH (CUSTOM PROCEDURE TRAY) ×2 IMPLANT
WIRE BENTSON .035X145CM (WIRE) IMPLANT
WIRE G V18X300CM (WIRE) IMPLANT

## 2023-07-07 NOTE — Discharge Instructions (Addendum)
No heavy lifting >20 lbs for 1 week  Ok to restart Eliquis tomorrow 07/08/23

## 2023-07-07 NOTE — Op Note (Signed)
Patient name: Henry Bryant MRN: [id number] DOB: Sep 04, 1960 Sex: male  07/07/2023 Pre-operative Diagnosis: Chronic limb threatening ischemia with tissue loss bilaterally Post-operative diagnosis:  Same Surgeon:  Daria Pastures, MD Procedure Performed:  Ultrasound-guided access of left common femoral artery Aortogram and right lower extremity CO2 angiogram Third order cannulation of right PT Balloon angioplasty of right PT with 3 x 220 Sterling balloon DCB angioplasty of right TP trunk with 4 x 60 Ranger Left lower extremity angiogram Closure of left common femoral artery with Mynx device 65 minutes conscious sedation   Indications: Henry Bryant is a 62 year old male with chronic limb threatening ischemia of the right foot with a right great toe wound.  When he was seen in clinic proximately 2 months ago he had some shallow ulcers of his left great toe but were healing at that time.  Since then his wounds have worsened bilaterally.  Arterial insufficiency was noted on vascular labs with an ABI 0.65 on the right leg.  Risks and benefits of angiogram with intervention were reviewed and he elected to proceed.  Given his creatinine of 2.0 we elected to perform CO2 angiography.  Findings: Widely patent aorta and bilateral renal arteries.  Widely patent iliac systems bilaterally.    Right common femoral artery, profunda and SFA widely patent.  Widely patent popliteal artery.   The right AT is chronically occluded and does not appear to reconstitute.  The peroneal is diminutive throughout its course.  The TP trunk has moderate disease with a approximate 60% stenosis.  The PT occluded shortly after its takeoff and is reconstituted in the mid segment via collaterals and runs down to the foot and fills the plantar branches.  The DP does not appear to fill.  Left common femoral artery, profunda and SFA widely patent.  There is a focal less than 30% stenosis in the distal SFA.  There is a focal  approximate 50% stenosis in the P2 segment of the popliteal artery.  The AT is chronically occluded and does not appear to reconstitute.  There is a significant 80% ostial stenosis of the peroneal artery.  The PT is widely patent throughout its course and fills the plantar branches.  The DP appears to fill via the pedal arch.   Procedure:  The patient was identified in the holding area and taken to the cath lab  The patient was then placed supine on the table and prepped and draped in the usual sterile fashion.  A time out was called.  Ultrasound was used to evaluate the left common femoral artery.  It was patent .  A digital ultrasound image was acquired.  A micropuncture needle was used to access the left common femoral artery under ultrasound guidance.  An 018 wire was advanced without resistance and a micropuncture sheath was placed.  The 018 wire was removed and a benson wire was placed.  The micropuncture sheath was exchanged for a 5 french sheath.  An omniflush catheter was advanced over the wire to the level of L-1.  An abdominal angiogram was obtained.  Next, using the omniflush catheter and a Bentson wire, the aortic bifurcation was crossed and the catheter was placed into theright external iliac artery and right runoff was obtained. This demonstrated the above findings.  A glide advantage wire was then placed down into the distal popliteal and a 5 [place] by 110 cm long sheath was placed into the right popliteal artery.  Using a V18 and a 2.6 [place]  CXI catheter the right PT lesion was crossed, angiography through the catheter confirmed true lumen crossing.  This was treated with a 3 x 220 Sterling balloon and the TP trunk was treated with a 4 x 60 Ranger balloon.  Completion angiography demonstrated wide patency of the treated segments with brisk flow and less than 30% residual stenosis.  The V18 was removed and a glide advantage wire was placed through the long 5 French sheath and into the SFA.  The  long 5 French sheath was then exchanged for a short 5 [place] sheath and left lower extremity runoff was performed via retrograde sheath injections which demonstrated the above findings.  The left common femoral artery was then closed with a minx closure device with excellent hemostasis.  Contrast: 18 cc Sedation: 65 minutes  Impression: Maximally revascularized with inline flow to the right foot via the PT No treatments performed on the left leg, inline flow to the foot via the PT   Daria Pastures MD [facility] [place] Office: [phone]
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
## 2023-07-08 ENCOUNTER — Encounter (HOSPITAL_COMMUNITY): Payer: Self-pay | Admitting: Vascular Surgery

## 2023-07-17 DIAGNOSIS — N289 Disorder of kidney and ureter, unspecified: Secondary | ICD-10-CM | POA: Insufficient documentation

## 2023-07-17 DIAGNOSIS — N138 Other obstructive and reflux uropathy: Secondary | ICD-10-CM | POA: Insufficient documentation

## 2023-07-17 DIAGNOSIS — N390 Urinary tract infection, site not specified: Secondary | ICD-10-CM | POA: Insufficient documentation

## 2023-07-17 DIAGNOSIS — Z8619 Personal history of other infectious and parasitic diseases: Secondary | ICD-10-CM | POA: Insufficient documentation

## 2023-07-17 DIAGNOSIS — E785 Hyperlipidemia, unspecified: Secondary | ICD-10-CM | POA: Insufficient documentation
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
## 2023-07-22 ENCOUNTER — Telehealth: Payer: Self-pay

## 2023-07-22 NOTE — Telephone Encounter (Signed)
 Triage Call:   Pt called with c/o terrible pain and weakness after the procedure.  Upon elaboration of symptoms, he explains a sharp pain is mostly the ankle when he puts pressure on it.  On chart review, noted wounds on both feet with R great toe being treated for possible osteomyelitis with 6 wks of ABT's and MRI of foot done 07/21/23 at [facility].  Explained the association of the infection and pain.  He is unsure of the MRI results but may call us  back if pain is not explained.  Has appt in [place]  on 08/19/23 and would like to keep it as is.
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
## 2023-07-24 ENCOUNTER — Ambulatory Visit: Payer: Medicaid Other | Admitting: Urology

## 2023-07-24 ENCOUNTER — Encounter: Payer: Self-pay | Admitting: Urology

## 2023-07-24 VITALS — BP 108/54 | HR 80

## 2023-07-24 DIAGNOSIS — Z8619 Personal history of other infectious and parasitic diseases: Secondary | ICD-10-CM

## 2023-07-24 DIAGNOSIS — R339 Retention of urine, unspecified: Secondary | ICD-10-CM

## 2023-07-24 DIAGNOSIS — N401 Enlarged prostate with lower urinary tract symptoms: Secondary | ICD-10-CM | POA: Diagnosis not present

## 2023-07-24 DIAGNOSIS — R0600 Dyspnea, unspecified: Secondary | ICD-10-CM

## 2023-07-24 DIAGNOSIS — E119 Type 2 diabetes mellitus without complications: Secondary | ICD-10-CM

## 2023-07-24 DIAGNOSIS — Z978 Presence of other specified devices: Secondary | ICD-10-CM

## 2023-07-24 DIAGNOSIS — N390 Urinary tract infection, site not specified: Secondary | ICD-10-CM

## 2023-07-24 DIAGNOSIS — Z09 Encounter for follow-up examination after completed treatment for conditions other than malignant neoplasm: Secondary | ICD-10-CM

## 2023-07-24 DIAGNOSIS — N289 Disorder of kidney and ureter, unspecified: Secondary | ICD-10-CM

## 2023-07-24 DIAGNOSIS — R54 Age-related physical debility: Secondary | ICD-10-CM

## 2023-07-24 DIAGNOSIS — Z87440 Personal history of urinary (tract) infections: Secondary | ICD-10-CM

## 2023-07-24 DIAGNOSIS — N138 Other obstructive and reflux uropathy: Secondary | ICD-10-CM

## 2023-07-24 DIAGNOSIS — F17210 Nicotine dependence, cigarettes, uncomplicated: Secondary | ICD-10-CM

## 2023-07-24 DIAGNOSIS — R262 Difficulty in walking, not elsewhere classified: Secondary | ICD-10-CM | POA: Insufficient documentation

## 2023-07-24 MED ORDER — TAMSULOSIN HCL 0.4 MG PO CAPS: 0.4000 mg | ORAL_CAPSULE | Freq: Every day | ORAL | 11 refills | Status: DC

## 2023-07-24 NOTE — Patient Instructions (Signed)
    Step 1 Get all of your supplies ready and place them near you. Step 2 Wash your hands with warm, soapy water or put on gloves. Step 3 Wash around the tip of your penis with warm, soapy water or a castille soap towelette. Step 4 Take catheter out of package and drain the lubricant over toilet. Step 5 Hold the penis at a 45 degree angle from the stomach in one hand and the catheter in the other hand. Step 6 Insert the catheter slowly into your urethra. If there is resistance when the catheter reaches the sphincter muscle,              take a deep breath and gently apply steady pressure.              DO NOT FORCE THE CATHETER Step 7 When the urine begins to flow, insert another inch and lower penis. Allow the urine to flow into the toilet. Step 8 When the flow of urine stops, slowly remove the catheter.

## 2023-07-24 NOTE — Progress Notes (Addendum)
Fill and Pull Catheter Removal  Patient is present today for a catheter removal.  Patient was cleaned and prepped in a sterile fashion of sterile water/ saline was instilled into the bladder when the patient felt the urge to urinate. 10ml of water was then drained from the balloon.  A 16FR foley cath was removed from the bladder no complications were noted .  Patient as then given some time to void on their own.  Patient cannot void  0ml on their own after some time.  Patient tolerated well.  Performed by: Kennyth Lose, CMA  Follow up/ Additional notes: patient will do cic 4 time daily  Continuous Intermittent Catheterization  Due to urine retentation patient is present today for a teaching of self I & O Catheterization. Patient was given detailed verbal and printed instructions of self catheterization. Patient was cleaned and prepped in a sterile fashion.  With instruction and assistance patient inserted a 16FR and urine return was noted 200 ml, urine was yellow in color. Patient tolerated well, no complications were noted Patient was given a sample bag with supplies to take home.  Instructions were given per Rometta Emery, NP for patient to cath 4 times daily.  An order was placed with [facility] solution for catheters to be sent to the patient's home. Patient is to follow up in 2 weeks with NP and return Monday 07/28/2023 for PVR.  Performed by: Kennyth Lose, CMA  Additional Notes: N/A

## 2023-07-28 ENCOUNTER — Ambulatory Visit: Payer: Medicaid Other

## 2023-07-28 ENCOUNTER — Telehealth: Payer: Self-pay | Admitting: Urology

## 2023-07-28 NOTE — Telephone Encounter (Signed)
Car is broke down until this afternoon and the nurse schedule is full. He also is doing self cath and needs to know where he gets the caths.

## 2023-07-28 NOTE — Telephone Encounter (Signed)
Please make sure patient is aware

## 2023-07-28 NOTE — Telephone Encounter (Signed)
Cath supply order and faxed to [facility] 07/24/23

## 2023-07-28 NOTE — Telephone Encounter (Signed)
Per 07/24/23 ov pt was taught CIC. Was an order sent for cath supplies? Please f/u with patient.

## 2023-07-28 NOTE — Telephone Encounter (Signed)
Patient was made aware and voiced understanding.

## 2023-07-30 ENCOUNTER — Ambulatory Visit: Payer: Medicaid Other | Admitting: Urology
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
## 2023-08-11 ENCOUNTER — Other Ambulatory Visit: Payer: Self-pay

## 2023-08-11 ENCOUNTER — Ambulatory Visit: Payer: Medicaid Other | Admitting: Urology

## 2023-08-11 DIAGNOSIS — N138 Other obstructive and reflux uropathy: Secondary | ICD-10-CM

## 2023-08-11 DIAGNOSIS — R339 Retention of urine, unspecified: Secondary | ICD-10-CM

## 2023-08-11 DIAGNOSIS — N390 Urinary tract infection, site not specified: Secondary | ICD-10-CM

## 2023-08-11 DIAGNOSIS — N289 Disorder of kidney and ureter, unspecified: Secondary | ICD-10-CM

## 2023-08-11 DIAGNOSIS — Z8619 Personal history of other infectious and parasitic diseases: Secondary | ICD-10-CM

## 2023-08-11 DIAGNOSIS — I739 Peripheral vascular disease, unspecified: Secondary | ICD-10-CM

## 2023-09-02 ENCOUNTER — Telehealth: Payer: Self-pay | Admitting: Urology

## 2023-09-02 NOTE — Telephone Encounter (Signed)
 Reach out to [place] at [facility] that state's she would place order today and patient should received supplies by Friday. Patient was made aware cath supplies should be delivery by Friday. Patient state's he was in the hospital for amputation of his toes and will call back to rescheduled appointment. Patient voiced understanding.

## 2023-09-02 NOTE — Telephone Encounter (Signed)
 Needs catheters asap he is almost out

## 2023-09-06 ENCOUNTER — Inpatient Hospital Stay (HOSPITAL_COMMUNITY)
Admission: EM | Admit: 2023-09-06 | Discharge: 2023-09-23 | DRG: 854 | Disposition: A | Discharge status: Skilled Nursing Facility | Source: Ambulatory Visit | Attending: Internal Medicine | Admitting: Internal Medicine

## 2023-09-06 ENCOUNTER — Encounter (HOSPITAL_COMMUNITY): Payer: Self-pay | Admitting: Internal Medicine

## 2023-09-06 ENCOUNTER — Other Ambulatory Visit: Payer: Self-pay

## 2023-09-06 ENCOUNTER — Encounter (HOSPITAL_COMMUNITY): Payer: Self-pay

## 2023-09-06 DIAGNOSIS — F17210 Nicotine dependence, cigarettes, uncomplicated: Secondary | ICD-10-CM

## 2023-09-06 DIAGNOSIS — Z885 Allergy status to narcotic agent status: Secondary | ICD-10-CM

## 2023-09-06 DIAGNOSIS — M86279 Subacute osteomyelitis, unspecified ankle and foot: Secondary | ICD-10-CM | POA: Diagnosis present

## 2023-09-06 DIAGNOSIS — I110 Hypertensive heart disease with heart failure: Secondary | ICD-10-CM | POA: Diagnosis present

## 2023-09-06 DIAGNOSIS — S80211A Abrasion, right knee, initial encounter: Secondary | ICD-10-CM | POA: Diagnosis present

## 2023-09-06 DIAGNOSIS — E11621 Type 2 diabetes mellitus with foot ulcer: Secondary | ICD-10-CM | POA: Diagnosis present

## 2023-09-06 DIAGNOSIS — E1140 Type 2 diabetes mellitus with diabetic neuropathy, unspecified: Secondary | ICD-10-CM | POA: Diagnosis present

## 2023-09-06 DIAGNOSIS — I272 Pulmonary hypertension, unspecified: Secondary | ICD-10-CM | POA: Diagnosis present

## 2023-09-06 DIAGNOSIS — N3080 Other cystitis without hematuria: Secondary | ICD-10-CM | POA: Diagnosis present

## 2023-09-06 DIAGNOSIS — M86272 Subacute osteomyelitis, left ankle and foot: Secondary | ICD-10-CM | POA: Diagnosis present

## 2023-09-06 DIAGNOSIS — Z89429 Acquired absence of other toe(s), unspecified side: Secondary | ICD-10-CM

## 2023-09-06 DIAGNOSIS — E1151 Type 2 diabetes mellitus with diabetic peripheral angiopathy without gangrene: Secondary | ICD-10-CM | POA: Diagnosis present

## 2023-09-06 DIAGNOSIS — Z79899 Other long term (current) drug therapy: Secondary | ICD-10-CM

## 2023-09-06 DIAGNOSIS — A4159 Other Gram-negative sepsis: Secondary | ICD-10-CM | POA: Diagnosis present

## 2023-09-06 DIAGNOSIS — E1152 Type 2 diabetes mellitus with diabetic peripheral angiopathy with gangrene: Secondary | ICD-10-CM | POA: Diagnosis present

## 2023-09-06 DIAGNOSIS — I4891 Unspecified atrial fibrillation: Secondary | ICD-10-CM | POA: Diagnosis present

## 2023-09-06 DIAGNOSIS — M86271 Subacute osteomyelitis, right ankle and foot: Secondary | ICD-10-CM | POA: Diagnosis present

## 2023-09-06 DIAGNOSIS — N320 Bladder-neck obstruction: Secondary | ICD-10-CM | POA: Diagnosis present

## 2023-09-06 DIAGNOSIS — W19XXXA Unspecified fall, initial encounter: Secondary | ICD-10-CM | POA: Diagnosis present

## 2023-09-06 DIAGNOSIS — Z7984 Long term (current) use of oral hypoglycemic drugs: Secondary | ICD-10-CM

## 2023-09-06 DIAGNOSIS — Z5941 Food insecurity: Secondary | ICD-10-CM

## 2023-09-06 DIAGNOSIS — I739 Peripheral vascular disease, unspecified: Secondary | ICD-10-CM | POA: Diagnosis not present

## 2023-09-06 DIAGNOSIS — I10 Essential (primary) hypertension: Secondary | ICD-10-CM | POA: Diagnosis not present

## 2023-09-06 DIAGNOSIS — I2510 Atherosclerotic heart disease of native coronary artery without angina pectoris: Secondary | ICD-10-CM | POA: Diagnosis present

## 2023-09-06 DIAGNOSIS — Y835 Amputation of limb(s) as the cause of abnormal reaction of the patient, or of later complication, without mention of misadventure at the time of the procedure: Secondary | ICD-10-CM | POA: Diagnosis not present

## 2023-09-06 DIAGNOSIS — K5900 Constipation, unspecified: Secondary | ICD-10-CM | POA: Diagnosis not present

## 2023-09-06 DIAGNOSIS — Z751 Person awaiting admission to adequate facility elsewhere: Secondary | ICD-10-CM

## 2023-09-06 DIAGNOSIS — K6289 Other specified diseases of anus and rectum: Secondary | ICD-10-CM | POA: Diagnosis present

## 2023-09-06 DIAGNOSIS — Z87891 Personal history of nicotine dependence: Secondary | ICD-10-CM

## 2023-09-06 DIAGNOSIS — M869 Osteomyelitis, unspecified: Secondary | ICD-10-CM | POA: Diagnosis not present

## 2023-09-06 DIAGNOSIS — B961 Klebsiella pneumoniae [K. pneumoniae] as the cause of diseases classified elsewhere: Secondary | ICD-10-CM | POA: Diagnosis present

## 2023-09-06 DIAGNOSIS — Z7982 Long term (current) use of aspirin: Secondary | ICD-10-CM

## 2023-09-06 DIAGNOSIS — L0231 Cutaneous abscess of buttock: Secondary | ICD-10-CM | POA: Diagnosis present

## 2023-09-06 DIAGNOSIS — K611 Rectal abscess: Secondary | ICD-10-CM | POA: Diagnosis present

## 2023-09-06 DIAGNOSIS — L97519 Non-pressure chronic ulcer of other part of right foot with unspecified severity: Secondary | ICD-10-CM | POA: Diagnosis present

## 2023-09-06 DIAGNOSIS — I7090 Unspecified atherosclerosis: Secondary | ICD-10-CM | POA: Diagnosis not present

## 2023-09-06 DIAGNOSIS — R7881 Bacteremia: Secondary | ICD-10-CM

## 2023-09-06 DIAGNOSIS — N3289 Other specified disorders of bladder: Secondary | ICD-10-CM | POA: Diagnosis not present

## 2023-09-06 DIAGNOSIS — J449 Chronic obstructive pulmonary disease, unspecified: Secondary | ICD-10-CM | POA: Diagnosis present

## 2023-09-06 DIAGNOSIS — E1169 Type 2 diabetes mellitus with other specified complication: Secondary | ICD-10-CM | POA: Diagnosis not present

## 2023-09-06 DIAGNOSIS — I484 Atypical atrial flutter: Secondary | ICD-10-CM | POA: Diagnosis present

## 2023-09-06 DIAGNOSIS — I5022 Chronic systolic (congestive) heart failure: Secondary | ICD-10-CM | POA: Diagnosis present

## 2023-09-06 DIAGNOSIS — L97529 Non-pressure chronic ulcer of other part of left foot with unspecified severity: Secondary | ICD-10-CM | POA: Diagnosis present

## 2023-09-06 DIAGNOSIS — T8781 Dehiscence of amputation stump: Secondary | ICD-10-CM | POA: Diagnosis not present

## 2023-09-06 DIAGNOSIS — Z823 Family history of stroke: Secondary | ICD-10-CM

## 2023-09-06 DIAGNOSIS — M86672 Other chronic osteomyelitis, left ankle and foot: Secondary | ICD-10-CM | POA: Diagnosis not present

## 2023-09-06 DIAGNOSIS — N134 Hydroureter: Secondary | ICD-10-CM | POA: Diagnosis not present

## 2023-09-06 DIAGNOSIS — Z5948 Other specified lack of adequate food: Secondary | ICD-10-CM

## 2023-09-06 DIAGNOSIS — E1165 Type 2 diabetes mellitus with hyperglycemia: Secondary | ICD-10-CM | POA: Diagnosis present

## 2023-09-06 DIAGNOSIS — M86671 Other chronic osteomyelitis, right ankle and foot: Secondary | ICD-10-CM | POA: Diagnosis not present

## 2023-09-06 DIAGNOSIS — F419 Anxiety disorder, unspecified: Secondary | ICD-10-CM | POA: Diagnosis present

## 2023-09-06 DIAGNOSIS — E785 Hyperlipidemia, unspecified: Secondary | ICD-10-CM | POA: Diagnosis present

## 2023-09-06 DIAGNOSIS — E876 Hypokalemia: Secondary | ICD-10-CM | POA: Diagnosis not present

## 2023-09-06 DIAGNOSIS — N1330 Unspecified hydronephrosis: Secondary | ICD-10-CM | POA: Diagnosis present

## 2023-09-06 DIAGNOSIS — Z66 Do not resuscitate: Secondary | ICD-10-CM | POA: Diagnosis present

## 2023-09-06 DIAGNOSIS — Z794 Long term (current) use of insulin: Secondary | ICD-10-CM | POA: Diagnosis not present

## 2023-09-06 DIAGNOSIS — Z7901 Long term (current) use of anticoagulants: Secondary | ICD-10-CM

## 2023-09-06 DIAGNOSIS — S80212A Abrasion, left knee, initial encounter: Secondary | ICD-10-CM | POA: Diagnosis present

## 2023-09-06 DIAGNOSIS — N3020 Other chronic cystitis without hematuria: Secondary | ICD-10-CM | POA: Diagnosis present

## 2023-09-06 DIAGNOSIS — Z888 Allergy status to other drugs, medicaments and biological substances status: Secondary | ICD-10-CM

## 2023-09-06 DIAGNOSIS — R339 Retention of urine, unspecified: Secondary | ICD-10-CM | POA: Diagnosis not present

## 2023-09-06 LAB — CBC WITH DIFFERENTIAL/PLATELET
Abs Immature Granulocytes: 0.08 10*3/uL — ABNORMAL HIGH (ref 0.00–0.07)
Basophils Absolute: 0 10*3/uL (ref 0.0–0.1)
Basophils Relative: 0 %
Eosinophils Absolute: 0 10*3/uL (ref 0.0–0.5)
Eosinophils Relative: 0 %
HCT: 37.6 % — ABNORMAL LOW (ref 39.0–52.0)
Hemoglobin: 11.2 g/dL — ABNORMAL LOW (ref 13.0–17.0)
Immature Granulocytes: 1 %
Lymphocytes Relative: 8 %
Lymphs Abs: 1.2 10*3/uL (ref 0.7–4.0)
MCH: 28.5 pg (ref 26.0–34.0)
MCHC: 29.8 g/dL — ABNORMAL LOW (ref 30.0–36.0)
MCV: 95.7 fL (ref 80.0–100.0)
Monocytes Absolute: 0.8 10*3/uL (ref 0.1–1.0)
Monocytes Relative: 6 %
Neutro Abs: 11.7 10*3/uL — ABNORMAL HIGH (ref 1.7–7.7)
Neutrophils Relative %: 85 %
Platelets: 434 10*3/uL — ABNORMAL HIGH (ref 150–400)
RBC: 3.93 MIL/uL — ABNORMAL LOW (ref 4.22–5.81)
RDW: 16 % — ABNORMAL HIGH (ref 11.5–15.5)
WBC: 13.8 10*3/uL — ABNORMAL HIGH (ref 4.0–10.5)
nRBC: 0 % (ref 0.0–0.2)

## 2023-09-06 LAB — URINALYSIS, ROUTINE W REFLEX MICROSCOPIC
Bilirubin Urine: NEGATIVE
Glucose, UA: 150 mg/dL — AB
Ketones, ur: NEGATIVE mg/dL
Leukocytes,Ua: NEGATIVE
Nitrite: NEGATIVE
Protein, ur: 100 mg/dL — AB
RBC / HPF: >50 RBC/hpf (ref 0–5)
Specific Gravity, Urine: 1.034 — ABNORMAL HIGH (ref 1.005–1.030)
WBC, UA: >50 WBC/hpf (ref 0–5)
pH: 6 (ref 5.0–8.0)

## 2023-09-06 LAB — COMPREHENSIVE METABOLIC PANEL
ALT: 15 U/L (ref 0–44)
AST: 10 U/L — ABNORMAL LOW (ref 15–41)
Albumin: 2.8 g/dL — ABNORMAL LOW (ref 3.5–5.0)
Alkaline Phosphatase: 95 U/L (ref 38–126)
Anion gap: 14 (ref 5–15)
BUN: 18 mg/dL (ref 8–23)
CO2: 21 mmol/L — ABNORMAL LOW (ref 22–32)
Calcium: 8.7 mg/dL — ABNORMAL LOW (ref 8.9–10.3)
Chloride: 105 mmol/L (ref 98–111)
Creatinine, Ser: 0.88 mg/dL (ref 0.61–1.24)
GFR, Estimated: >60 mL/min (ref >60)
Glucose, Bld: 294 mg/dL — ABNORMAL HIGH (ref 70–99)
Potassium: 3.7 mmol/L (ref 3.5–5.1)
Sodium: 140 mmol/L (ref 135–145)
Total Bilirubin: 0.4 mg/dL (ref 0.0–1.2)
Total Protein: 7.5 g/dL (ref 6.5–8.1)

## 2023-09-06 LAB — HEMOGLOBIN A1C
Hgb A1c MFr Bld: 6.8 % — ABNORMAL HIGH (ref 4.8–5.6)
Mean Plasma Glucose: 148.46 mg/dL

## 2023-09-06 LAB — PHOSPHORUS: Phosphorus: 4 mg/dL (ref 2.5–4.6)

## 2023-09-06 LAB — MAGNESIUM: Magnesium: 2 mg/dL (ref 1.7–2.4)

## 2023-09-06 LAB — GLUCOSE, CAPILLARY: Glucose-Capillary: 363 mg/dL — ABNORMAL HIGH (ref 70–99)

## 2023-09-06 MED ORDER — VANCOMYCIN HCL IN DEXTROSE 1-5 GM/200ML-% IV SOLN
1000.0000 mg | INTRAVENOUS | Status: AC
  Administered 2023-09-06: 1000 mg via INTRAVENOUS
  Filled 2023-09-06: qty 200

## 2023-09-06 MED ORDER — VANCOMYCIN HCL 750 MG/150ML IV SOLN
750.0000 mg | Freq: Two times a day (BID) | INTRAVENOUS | Status: DC
  Administered 2023-09-07 – 2023-09-08 (×3): 750 mg via INTRAVENOUS
  Filled 2023-09-06 (×3): qty 150

## 2023-09-06 MED ORDER — INSULIN ASPART 100 UNIT/ML IJ SOLN
0.0000 [IU] | Freq: Three times a day (TID) | INTRAMUSCULAR | Status: DC
  Administered 2023-09-07: 5 [IU] via SUBCUTANEOUS
  Administered 2023-09-07: 3 [IU] via SUBCUTANEOUS
  Administered 2023-09-07: 8 [IU] via SUBCUTANEOUS
  Administered 2023-09-08 (×2): 5 [IU] via SUBCUTANEOUS
  Administered 2023-09-09: 3 [IU] via SUBCUTANEOUS
  Administered 2023-09-09: 5 [IU] via SUBCUTANEOUS
  Administered 2023-09-09: 4 [IU] via SUBCUTANEOUS
  Administered 2023-09-10: 5 [IU] via SUBCUTANEOUS
  Administered 2023-09-10: 3 [IU] via SUBCUTANEOUS
  Administered 2023-09-10 – 2023-09-11 (×3): 2 [IU] via SUBCUTANEOUS
  Administered 2023-09-11 – 2023-09-12 (×2): 3 [IU] via SUBCUTANEOUS
  Administered 2023-09-12: 2 [IU] via SUBCUTANEOUS
  Administered 2023-09-12 – 2023-09-17 (×3): 3 [IU] via SUBCUTANEOUS
  Administered 2023-09-18 (×2): 2 [IU] via SUBCUTANEOUS
  Administered 2023-09-18 – 2023-09-19 (×2): 3 [IU] via SUBCUTANEOUS
  Administered 2023-09-19: 5 [IU] via SUBCUTANEOUS
  Administered 2023-09-19: 3 [IU] via SUBCUTANEOUS
  Administered 2023-09-20: 5 [IU] via SUBCUTANEOUS
  Administered 2023-09-20: 2 [IU] via SUBCUTANEOUS
  Administered 2023-09-20: 3 [IU] via SUBCUTANEOUS
  Administered 2023-09-21: 2 [IU] via SUBCUTANEOUS
  Administered 2023-09-21: 5 [IU] via SUBCUTANEOUS
  Administered 2023-09-21: 3 [IU] via SUBCUTANEOUS
  Administered 2023-09-22: 2 [IU] via SUBCUTANEOUS
  Administered 2023-09-22: 5 [IU] via SUBCUTANEOUS
  Administered 2023-09-23: 3 [IU] via SUBCUTANEOUS

## 2023-09-06 MED ORDER — ACETAMINOPHEN 650 MG RE SUPP: 650.0000 mg | Freq: Four times a day (QID) | RECTAL | Status: DC | PRN

## 2023-09-06 MED ORDER — PIPERACILLIN-TAZOBACTAM 3.375 G IVPB
3.3750 g | Freq: Three times a day (TID) | INTRAVENOUS | Status: DC
  Administered 2023-09-06 – 2023-09-09 (×8): 3.375 g via INTRAVENOUS
  Filled 2023-09-06 (×8): qty 50

## 2023-09-06 MED ORDER — ONDANSETRON HCL 4 MG PO TABS: 4.0000 mg | ORAL_TABLET | Freq: Four times a day (QID) | ORAL | Status: DC | PRN

## 2023-09-06 MED ORDER — ACETAMINOPHEN 325 MG PO TABS
650.0000 mg | ORAL_TABLET | Freq: Four times a day (QID) | ORAL | Status: DC | PRN
  Administered 2023-09-07 – 2023-09-22 (×3): 650 mg via ORAL
  Filled 2023-09-06 (×4): qty 2

## 2023-09-06 MED ORDER — ONDANSETRON HCL 4 MG/2ML IJ SOLN: 4.0000 mg | Freq: Four times a day (QID) | INTRAMUSCULAR | Status: DC | PRN

## 2023-09-06 MED ORDER — INSULIN ASPART 100 UNIT/ML IJ SOLN
0.0000 [IU] | Freq: Every day | INTRAMUSCULAR | Status: DC
  Administered 2023-09-06: 5 [IU] via SUBCUTANEOUS
  Administered 2023-09-14: 3 [IU] via SUBCUTANEOUS

## 2023-09-06 MED ORDER — INSULIN GLARGINE-YFGN 100 UNIT/ML ~~LOC~~ SOLN
25.0000 [IU] | Freq: Every day | SUBCUTANEOUS | Status: DC
  Administered 2023-09-06 – 2023-09-07 (×2): 25 [IU] via SUBCUTANEOUS
  Filled 2023-09-06 (×4): qty 0.25

## 2023-09-06 NOTE — Progress Notes (Signed)
 Pharmacy Antibiotic Note  Henry Bryant is a 63 y.o. male admitted on 09/06/2023 with  wound infection .  Pharmacy has been consulted for Vanco, Zosyn dosing.  Active Problem(s): rectal pain and a purulent discharge in that area. fall several days ago and fell on his buttocks, - Zosyn 3.375g IV x 1 at [facility] at [facility]  [facility]: Imaging showed a large, loculated, rim-enhancing fluid collection within the left gluteus and left perirectal tissues measuring 9 x 6 x 7 0.2 x 11 cm in size, also showed emphysematous cystitis. Urinalysis concerning for UTI. He underwent an I&D in the ED, was given antibiotics.  H/o tobacco (quit 6 yrs ago)  Labs at [facility]: Scr 1.09, Glcose 382, WBC 14.2, H/H 11.1/33.8, Plts 419  PMH:  type 2 diabetes mellitus, chronic systolic CHF, CAD, hypertension, pulmonary hypertension, A-fib/flutter, recent bilateral toe amputation by podiatry about 4 weeks ago, urinary retention and self-catheterization   ID: Wound infection(s), Rectal abscess, emphysematous cystitis  - 3/1: I&D at [facility] + drain Afebrile, WBC 14.2, Scr 1.09  Vanco 3/1 > Zosyn 3/1>>   Plan: Zosyn 3.375g IV q8hr Vanco 1g IV x 1 then  Vancomycin 750 mg IV Q 12 hrs. Goal AUC 400-550. Expected AUC: 517 SCr used: 1.09     Height: 5\' 10"  (177.8 cm) Weight: 63.8 kg (140 lb 10.5 oz) IBW/kg (Calculated) : 73  Temp (24hrs), Avg:97.7 F (36.5 C), Min:97.7 F (36.5 C), Max:97.7 F (36.5 C)  No results for input(s): "WBC", "CREATININE", "LATICACIDVEN", "VANCOTROUGH", "VANCOPEAK", "VANCORANDOM", "GENTTROUGH", "GENTPEAK", "GENTRANDOM", "TOBRATROUGH", "TOBRAPEAK", "TOBRARND", "AMIKACINPEAK", "AMIKACINTROU", "AMIKACIN" in the last 168 hours.  CrCl cannot be calculated (Patient's most recent lab result is older than the maximum 21 days allowed.).    Allergies  Allergen Reactions   Avelox [Moxifloxacin Hcl In Nacl] Shortness Of Breath and Itching   Codeine Shortness Of Breath and Itching    Javion Holmer S. Merilynn Finland, PharmD, BCPS Clinical Staff Pharmacist  [facility] 09/06/2023 6:35 PM

## 2023-09-06 NOTE — Plan of Care (Signed)
   Problem: Education: Goal: Knowledge of General Education information will improve Description Including pain rating scale, medication(s)/side effects and non-pharmacologic comfort measures Outcome: Progressing

## 2023-09-06 NOTE — Progress Notes (Signed)
 Wound images uploaded and in Media
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
## 2023-09-07 ENCOUNTER — Inpatient Hospital Stay (HOSPITAL_COMMUNITY)

## 2023-09-07 DIAGNOSIS — R339 Retention of urine, unspecified: Secondary | ICD-10-CM

## 2023-09-07 DIAGNOSIS — L0231 Cutaneous abscess of buttock: Secondary | ICD-10-CM

## 2023-09-07 DIAGNOSIS — N3289 Other specified disorders of bladder: Secondary | ICD-10-CM

## 2023-09-07 DIAGNOSIS — N1330 Unspecified hydronephrosis: Secondary | ICD-10-CM

## 2023-09-07 DIAGNOSIS — N134 Hydroureter: Secondary | ICD-10-CM

## 2023-09-07 DIAGNOSIS — K611 Rectal abscess: Secondary | ICD-10-CM | POA: Diagnosis not present

## 2023-09-07 LAB — CBC
HCT: 32.2 % — ABNORMAL LOW (ref 39.0–52.0)
Hemoglobin: 9.5 g/dL — ABNORMAL LOW (ref 13.0–17.0)
MCH: 27.9 pg (ref 26.0–34.0)
MCHC: 29.5 g/dL — ABNORMAL LOW (ref 30.0–36.0)
MCV: 94.7 fL (ref 80.0–100.0)
Platelets: 348 10*3/uL (ref 150–400)
RBC: 3.4 MIL/uL — ABNORMAL LOW (ref 4.22–5.81)
RDW: 16.1 % — ABNORMAL HIGH (ref 11.5–15.5)
WBC: 11 10*3/uL — ABNORMAL HIGH (ref 4.0–10.5)
nRBC: 0 % (ref 0.0–0.2)

## 2023-09-07 LAB — COMPREHENSIVE METABOLIC PANEL
ALT: 14 U/L (ref 0–44)
AST: 13 U/L — ABNORMAL LOW (ref 15–41)
Albumin: 2.4 g/dL — ABNORMAL LOW (ref 3.5–5.0)
Alkaline Phosphatase: 89 U/L (ref 38–126)
Anion gap: 11 (ref 5–15)
BUN: 21 mg/dL (ref 8–23)
CO2: 21 mmol/L — ABNORMAL LOW (ref 22–32)
Calcium: 8.2 mg/dL — ABNORMAL LOW (ref 8.9–10.3)
Chloride: 107 mmol/L (ref 98–111)
Creatinine, Ser: 1.04 mg/dL (ref 0.61–1.24)
GFR, Estimated: >60 mL/min (ref >60)
Glucose, Bld: 226 mg/dL — ABNORMAL HIGH (ref 70–99)
Potassium: 3.1 mmol/L — ABNORMAL LOW (ref 3.5–5.1)
Sodium: 139 mmol/L (ref 135–145)
Total Bilirubin: 0.7 mg/dL (ref 0.0–1.2)
Total Protein: 6.5 g/dL (ref 6.5–8.1)

## 2023-09-07 LAB — GLUCOSE, CAPILLARY
Glucose-Capillary: 215 mg/dL — ABNORMAL HIGH (ref 70–99)
Glucose-Capillary: 252 mg/dL — ABNORMAL HIGH (ref 70–99)
Glucose-Capillary: 153 mg/dL — ABNORMAL HIGH (ref 70–99)
Glucose-Capillary: 178 mg/dL — ABNORMAL HIGH (ref 70–99)

## 2023-09-07 LAB — HIV ANTIBODY (ROUTINE TESTING W REFLEX): HIV Screen 4th Generation wRfx: NONREACTIVE

## 2023-09-07 MED ORDER — POTASSIUM CHLORIDE CRYS ER 20 MEQ PO TBCR
40.0000 meq | EXTENDED_RELEASE_TABLET | Freq: Once | ORAL | Status: AC
  Administered 2023-09-07: 40 meq via ORAL
  Filled 2023-09-07: qty 2

## 2023-09-07 MED ORDER — PANTOPRAZOLE SODIUM 40 MG PO TBEC
40.0000 mg | DELAYED_RELEASE_TABLET | Freq: Every day | ORAL | Status: DC
  Administered 2023-09-07 – 2023-09-22 (×15): 40 mg via ORAL
  Filled 2023-09-07 (×16): qty 1

## 2023-09-07 MED ORDER — CHLORHEXIDINE GLUCONATE CLOTH 2 % EX PADS
6.0000 | MEDICATED_PAD | Freq: Every day | CUTANEOUS | Status: DC
Start: 2023-09-07 — End: 2023-09-23
  Administered 2023-09-07 – 2023-09-23 (×17): 6 via TOPICAL

## 2023-09-07 MED ORDER — ROSUVASTATIN CALCIUM 20 MG PO TABS
40.0000 mg | ORAL_TABLET | Freq: Every day | ORAL | Status: DC
  Administered 2023-09-07 – 2023-09-23 (×17): 40 mg via ORAL
  Filled 2023-09-07 (×17): qty 2

## 2023-09-07 MED ORDER — ALPRAZOLAM 0.5 MG PO TABS
0.5000 mg | ORAL_TABLET | Freq: Every day | ORAL | Status: DC
  Administered 2023-09-07 – 2023-09-22 (×16): 0.5 mg via ORAL
  Filled 2023-09-07 (×16): qty 1

## 2023-09-07 MED ORDER — TAMSULOSIN HCL 0.4 MG PO CAPS
0.4000 mg | ORAL_CAPSULE | Freq: Every day | ORAL | Status: DC
  Administered 2023-09-07 – 2023-09-21 (×15): 0.4 mg via ORAL
  Filled 2023-09-07 (×15): qty 1

## 2023-09-07 MED ORDER — OXYCODONE HCL 5 MG PO TABS
5.0000 mg | ORAL_TABLET | ORAL | Status: DC | PRN
  Administered 2023-09-07 – 2023-09-15 (×20): 5 mg via ORAL
  Filled 2023-09-07 (×22): qty 1

## 2023-09-07 MED ORDER — AMIODARONE HCL 200 MG PO TABS
200.0000 mg | ORAL_TABLET | Freq: Every day | ORAL | Status: DC
  Administered 2023-09-07 – 2023-09-23 (×17): 200 mg via ORAL
  Filled 2023-09-07 (×17): qty 1

## 2023-09-07 MED ORDER — ENOXAPARIN SODIUM 60 MG/0.6ML IJ SOSY
60.0000 mg | PREFILLED_SYRINGE | Freq: Two times a day (BID) | INTRAMUSCULAR | Status: DC
  Administered 2023-09-07 – 2023-09-17 (×19): 60 mg via SUBCUTANEOUS
  Filled 2023-09-07 (×19): qty 0.6

## 2023-09-07 MED ORDER — METOPROLOL SUCCINATE ER 25 MG PO TB24
25.0000 mg | ORAL_TABLET | Freq: Every day | ORAL | Status: DC
  Administered 2023-09-07 – 2023-09-23 (×16): 25 mg via ORAL
  Filled 2023-09-07 (×17): qty 1
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
## 2023-09-07 NOTE — Progress Notes (Signed)
 PROGRESS NOTE  WING SCHOCH ZOX:[id number] DOB: [birth year] DOA: 09/06/2023 PCP: Toma Deiters, MD   LOS: 1 day   Brief Narrative / Interim history: 63 y.o. male with medical history significant of type 2 diabetes mellitus, chronic systolic CHF, hypertension, pulmonary hypertension, A-fib/flutter, recent bilateral toe amputation by podiatry about 4 weeks ago, urinary retention and self-catheterization who comes into the hospital with rectal pain and a purulent discharge in that area.  He tells me he had a fall several days ago and fell on his buttocks, was feeling fine up until 1 to 2 days ago when he started noticing increased pain and just now his seeing discharge. He presented to [facility] emergency room. Imaging there showed a large, loculated, rim-enhancing fluid collection within the left gluteus and left perirectal tissues measuring 9 x 6 x 7 0.2 x 11 cm in size, also showed emphysematous cystitis. Urinalysis concerning for UTI. He underwent an I&D in the ED, was given antibiotics. He also reports bilateral great toe osteomyelitis status post surgery few weeks back.  Subjective / 24h Interval events: Febrile overnight to 101.6  Assesement and Plan: Principal problem Rectal abscess -General Surgery was consulted at [facility] prior to transfer, it appears that he had a bedside evaluation, he had copious amount of purulent discharge and had a small drain placed. -Area looks better today, no tenderness, no erythema  Active problems Sepsis due to emphysematous cystitis -discussed with urology 3/1, they recommend Foley catheter and antibiotics.  Monitor cultures.  Febrile today  Insulin-dependent diabetes mellitus, with hyperglycemia-continue Lantus, has been placed on sliding scale.  Lab Results  Component Value Date   HGBA1C 6.8 (H) 09/06/2023   CBG (last 3)  Recent Labs    09/06/23 2026 09/07/23 0829 09/07/23 1147  GLUCAP 363* 215* 252*     Chronic systolic  CHF-euvolemic, continue metoprolol, hold Entresto given fever and soft blood pressures.  Hold diuretics as well while febrile   History of a flutter with RVR -appears to be on amiodarone, continue to hold Eliquis while not sure if he will need interventions   Essential hypertension-currently normotensive   PAD-history of revascularization   Diabetic ulcer of toes, history of osteomyelitis-status post surgery with toe amputation about 4 weeks ago per patient, apparently has pretty significant purulent discharge per RN, obtain MRI as worsening osteomyelitis could be the cause of his fevers  Scheduled Meds:  ALPRAZolam  0.5 mg Oral QHS   amiodarone  200 mg Oral Daily   insulin aspart  0-15 Units Subcutaneous TID WC   insulin aspart  0-5 Units Subcutaneous QHS   insulin glargine-yfgn  25 Units Subcutaneous QHS   metoprolol succinate  25 mg Oral Daily   pantoprazole  40 mg Oral QHS   rosuvastatin  40 mg Oral Daily   tamsulosin  0.4 mg Oral Daily   Continuous Infusions:  piperacillin-tazobactam (ZOSYN)  IV Stopped (09/07/23 0731)   vancomycin 750 mg (09/07/23 1003)   PRN Meds:.acetaminophen **OR** acetaminophen, ondansetron **OR** ondansetron (ZOFRAN) IV  Current Outpatient Medications  Medication Instructions   ALPRAZolam (XANAX) 0.5 mg, Daily at bedtime   amiodarone (PACERONE) 200 mg, Daily   amoxicillin-clavulanate (AUGMENTIN) 875-125 MG tablet 1 tablet, Oral, 2 times daily with meals   apixaban (ELIQUIS) 5 mg, 2 times daily   aspirin EC 81 mg, Oral, Every morning, Swallow whole.   doxycycline (VIBRA-TABS) 100 mg, 2 times daily with meals   Fish Oil 1,000 mg, Daily   Jardiance 10 mg,  Daily   Lantus SoloStar 45 Units, 2 times daily before meals   metoprolol succinate (TOPROL-XL) 25 mg, Daily   pantoprazole (PROTONIX) 40 mg, Oral, Daily   rosuvastatin (CRESTOR) 40 mg, Daily   sacubitril-valsartan (ENTRESTO) 97-103 MG 1 tablet, 2 times daily   sildenafil (REVATIO) 20 mg, Daily PRN    spironolactone (ALDACTONE) 25 mg, Daily at bedtime   tamsulosin (FLOMAX) 0.4 mg, Oral, Daily   vitamin C 1,000 mg, 2 times daily    Diet Orders (From admission, onward)     Start     Ordered   09/07/23 0847  Diet heart healthy/carb modified Room service appropriate? Yes; Fluid consistency: Thin  Diet effective now       Question Answer Comment  Diet-HS Snack? Nothing   Room service appropriate? Yes   Fluid consistency: Thin      09/07/23 0846            DVT prophylaxis:    Lab Results  Component Value Date   PLT 348 09/07/2023      Code Status: Do not attempt resuscitation (DNR) PRE-ARREST INTERVENTIONS DESIRED  Family Communication: No family at bedside  Status is: Inpatient Remains inpatient appropriate because: Severity of illness  Level of care: Telemetry  Consultants:  None  Objective: Vitals:   09/06/23 1751 09/06/23 2033 09/07/23 0253 09/07/23 0405  BP:  (!) 129/47 125/72   Pulse:  100 95   Resp:  19 16   Temp:  100.2 F (37.9 C) (!) 101.6 F (38.7 C) (!) 100.6 F (38.1 C)  TempSrc:  Oral Oral Oral  SpO2:  99% 97%   Weight: 63.8 kg     Height: 5\' 10"  (1.778 m)       Intake/Output Summary (Last 24 hours) at 09/07/2023 1211 Last data filed at 09/07/2023 0804 Gross per 24 hour  Intake 250.11 ml  Output 300 ml  Net -49.89 ml   Wt Readings from Last 3 Encounters:  09/06/23 63.8 kg  07/07/23 72.6 kg  05/09/23 78 kg    Examination:  Constitutional: NAD Eyes: no scleral icterus ENMT: Mucous membranes are moist.  Neck: normal, supple Respiratory: clear to auscultation bilaterally, no wheezing, no crackles. Normal respiratory effort. No accessory muscle use.  Cardiovascular: Regular rate and rhythm, no murmurs / rubs / gallops. No LE edema.  Abdomen: non distended, no tenderness. Bowel sounds positive.  Musculoskeletal: no clubbing / cyanosis.    Data Reviewed: I have independently reviewed following labs and imaging studies    CBC Recent Labs  Lab 09/06/23 1829 09/07/23 0539  WBC 13.8* 11.0*  HGB 11.2* 9.5*  HCT 37.6* 32.2*  PLT 434* 348  MCV 95.7 94.7  MCH 28.5 27.9  MCHC 29.8* 29.5*  RDW 16.0* 16.1*  LYMPHSABS 1.2  --   MONOABS 0.8  --   EOSABS 0.0  --   BASOSABS 0.0  --     Recent Labs  Lab 09/06/23 1829 09/07/23 0539  NA 140 139  K 3.7 3.1*  CL 105 107  CO2 21* 21*  GLUCOSE 294* 226*  BUN 18 21  CREATININE 0.88 1.04  CALCIUM 8.7* 8.2*  AST 10* 13*  ALT 15 14  ALKPHOS 95 89  BILITOT 0.4 0.7  ALBUMIN 2.8* 2.4*  MG 2.0  --   HGBA1C 6.8*  --     ------------------------------------------------------------------------------------------------------------------ No results for input(s): "CHOL", "HDL", "LDLCALC", "TRIG", "CHOLHDL", "LDLDIRECT" in the last 72 hours.  Lab Results  Component Value Date  HGBA1C 6.8 (H) 09/06/2023   ------------------------------------------------------------------------------------------------------------------ No results for input(s): "TSH", "T4TOTAL", "T3FREE", "THYROIDAB" in the last 72 hours.  Invalid input(s): "FREET3"  Cardiac Enzymes No results for input(s): "CKMB", "TROPONINI", "MYOGLOBIN" in the last 168 hours.  Invalid input(s): "CK" ------------------------------------------------------------------------------------------------------------------ No results found for: "BNP"  CBG: Recent Labs  Lab 09/06/23 2026 09/07/23 0829 09/07/23 1147  GLUCAP 363* 215* 252*    Recent Results (from the past 240 hours)  Culture, blood (Routine X 2) w Reflex to ID Panel     Status: None (Preliminary result)   Collection Time: 09/06/23  6:26 PM   Specimen: BLOOD RIGHT ARM  Result Value Ref Range Status   Specimen Description   Final    BLOOD RIGHT ARM Performed at [facility], [address], [place], [place] [address]    Special Requests   Final    BOTTLES DRAWN AEROBIC ONLY Blood Culture adequate volume Performed at [facility], [address], [place], [place] [address]    Culture   Final    NO GROWTH < 12 HOURS Performed at [facility], [address] [place], [place] [address]    Report Status PENDING  Incomplete  Culture, blood (Routine X 2) w Reflex to ID Panel     Status: None (Preliminary result)   Collection Time: 09/06/23  6:26 PM   Specimen: BLOOD RIGHT ARM  Result Value Ref Range Status   Specimen Description   Final    BLOOD RIGHT ARM Performed at [facility], [address], [place], [place] [address]    Special Requests   Final    BOTTLES DRAWN AEROBIC ONLY Blood Culture adequate volume Performed at [facility], [address], [place], [place] [address]    Culture   Final    NO GROWTH < 12 HOURS Performed at [facility], [address], [place], [place] [address]    Report Status PENDING  Incomplete     Radiology Studies: No results found.   Pamella Pert, MD, PhD [facility]  Between 7 am - 7 pm I am available, please contact me via Amion (for emergencies) or Securechat (non urgent messages)  Between 7 pm - 7 am I am not available, please contact night coverage MD/APP via Amion

## 2023-09-07 NOTE — Plan of Care (Signed)
   Problem: Nutrition: Goal: Adequate nutrition will be maintained Outcome: Progressing

## 2023-09-08 ENCOUNTER — Other Ambulatory Visit: Payer: Self-pay

## 2023-09-08 ENCOUNTER — Inpatient Hospital Stay (HOSPITAL_COMMUNITY)

## 2023-09-08 DIAGNOSIS — K611 Rectal abscess: Secondary | ICD-10-CM | POA: Diagnosis not present

## 2023-09-08 LAB — COMPREHENSIVE METABOLIC PANEL
ALT: 18 U/L (ref 0–44)
AST: 18 U/L (ref 15–41)
Albumin: 2.2 g/dL — ABNORMAL LOW (ref 3.5–5.0)
Alkaline Phosphatase: 75 U/L (ref 38–126)
Anion gap: 13 (ref 5–15)
BUN: 24 mg/dL — ABNORMAL HIGH (ref 8–23)
CO2: 19 mmol/L — ABNORMAL LOW (ref 22–32)
Calcium: 8.1 mg/dL — ABNORMAL LOW (ref 8.9–10.3)
Chloride: 105 mmol/L (ref 98–111)
Creatinine, Ser: 1.14 mg/dL (ref 0.61–1.24)
GFR, Estimated: >60 mL/min (ref >60)
Glucose, Bld: 258 mg/dL — ABNORMAL HIGH (ref 70–99)
Potassium: 3.6 mmol/L (ref 3.5–5.1)
Sodium: 137 mmol/L (ref 135–145)
Total Bilirubin: 0.4 mg/dL (ref 0.0–1.2)
Total Protein: 5.9 g/dL — ABNORMAL LOW (ref 6.5–8.1)

## 2023-09-08 LAB — CBC
HCT: 29.3 % — ABNORMAL LOW (ref 39.0–52.0)
Hemoglobin: 8.6 g/dL — ABNORMAL LOW (ref 13.0–17.0)
MCH: 28.5 pg (ref 26.0–34.0)
MCHC: 29.4 g/dL — ABNORMAL LOW (ref 30.0–36.0)
MCV: 97 fL (ref 80.0–100.0)
Platelets: 340 10*3/uL (ref 150–400)
RBC: 3.02 MIL/uL — ABNORMAL LOW (ref 4.22–5.81)
RDW: 16.1 % — ABNORMAL HIGH (ref 11.5–15.5)
WBC: 9.1 10*3/uL (ref 4.0–10.5)
nRBC: 0 % (ref 0.0–0.2)

## 2023-09-08 LAB — GLUCOSE, CAPILLARY
Glucose-Capillary: 320 mg/dL — ABNORMAL HIGH (ref 70–99)
Glucose-Capillary: 243 mg/dL — ABNORMAL HIGH (ref 70–99)
Glucose-Capillary: 237 mg/dL — ABNORMAL HIGH (ref 70–99)
Glucose-Capillary: 179 mg/dL — ABNORMAL HIGH (ref 70–99)

## 2023-09-08 LAB — MAGNESIUM: Magnesium: 1.8 mg/dL (ref 1.7–2.4)

## 2023-09-08 MED ORDER — IOHEXOL 300 MG/ML  SOLN
30.0000 mL | Freq: Once | INTRAMUSCULAR | Status: AC | PRN
  Administered 2023-09-08: 30 mL

## 2023-09-08 MED ORDER — SODIUM CHLORIDE 0.9 % IV SOLN
INTRAVENOUS | Status: AC
Start: 2023-09-08 — End: ?
  Filled 2023-09-08: qty 500

## 2023-09-08 MED ORDER — INSULIN GLARGINE 100 UNIT/ML ~~LOC~~ SOLN
25.0000 [IU] | Freq: Every day | SUBCUTANEOUS | Status: DC
  Administered 2023-09-08: 25 [IU] via SUBCUTANEOUS
  Filled 2023-09-08 (×3): qty 0.25

## 2023-09-08 MED ORDER — MEDIHONEY WOUND/BURN DRESSING EX PSTE
1.0000 | PASTE | Freq: Every day | CUTANEOUS | Status: DC
  Administered 2023-09-08 – 2023-09-23 (×15): 1 via TOPICAL
  Filled 2023-09-08 (×3): qty 44

## 2023-09-08 MED ORDER — FENTANYL CITRATE PF 50 MCG/ML IJ SOSY
25.0000 ug | PREFILLED_SYRINGE | Freq: Once | INTRAMUSCULAR | Status: AC
  Administered 2023-09-08: 25 ug via INTRAVENOUS
  Filled 2023-09-08: qty 1

## 2023-09-08 MED ORDER — IOHEXOL 300 MG/ML  SOLN
80.0000 mL | Freq: Once | INTRAMUSCULAR | Status: AC | PRN
  Administered 2023-09-08: 80 mL via INTRAVENOUS

## 2023-09-08 MED ORDER — LINEZOLID 600 MG/300ML IV SOLN
600.0000 mg | Freq: Two times a day (BID) | INTRAVENOUS | Status: DC
  Administered 2023-09-08: 600 mg via INTRAVENOUS
  Filled 2023-09-08 (×5): qty 300
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
## 2023-09-08 NOTE — Plan of Care (Signed)
 Pt transferred to [facility] for vascular work.report called and RN aware of insulin status. All belongings left with patient.
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
## 2023-09-08 NOTE — Consult Note (Signed)
 WOC Nurse Consult Note: patient is followed by [facility] at [facility] in [place], last seen by them 08/29/2023 with osteomyelitis of B feet, open wounds B knees and diabetic ulcer R great toe  Reason for Consult: wounds to knees and feet  Wound type: 1.  Full thickness B knees ? Etiology ? R/t trauma  2.  Partial amputation R great toe, amputation L great toe with full thickness wounds remaining   Pressure Injury POA: NA  Measurement: per nursing flowsheet  Wound bed: 1.  R knee full thickness 50% pink moist 50% yellow fibrin  2.  L knee full thickness 100% brown dry necrotic tissue  3.  R great toe full thickness 60% pink 40% yellow fibrin 4.  L great toe full thickness gauze covering wound bed in photo documentation  Drainage (amount, consistency, odor) tan exudate from R knee and R great toe  Periwound: dry scaly skin, some black eschar to B great toes; skin surrounding knees intact  Dressing procedure/placement/frequency:  Cleanse B great toe wounds with Vashe wound cleanser Hart [place] [phone]), apply Vashe moistened gauze to wound beds daily. Paint skin surrounding wounds with Betadine.  Cover with dry gauze and Kerlix roll gauze.  Cleanse B knee wounds with Vashe wound cleanser Hart [place] [phone]), apply Medihoney to wound beds daily, cover with dry gauze and silicone foam or ABD pad and Kerlix roll gauze whichever is preferred.   Per [facility] note patient receiving 6 weeks worth of IV antibiotics for B foot osteomyelitis.  Patient to continue to follow with Wound care center at discharge.   POC discussed with bedside nurse. WOC team will not follow.    Thank you,    Priscella Mann MSN, RN-BC, [employer] [phone]

## 2023-09-08 NOTE — Consult Note (Signed)
 WOC Nurse Consult Note: Refer to previous remote consult notes performed by the [facility] team.  Requested to assess wounds in person and remove a tightly adhered dressing. This was removed after moistening with NS.  Left knee with full thickness wound, 100% adhered slough, 2X1cm Right knee full thickness wound 80% red and moist, 20% yellow, 3X5X.2cm Left great toe full thickness wound 80% red, 20% slough/eschar, 2X1X1cm, small amt tan drainage Right great toe full thickness wound 80% red, 20% slough/eschar. Small amt tan drainage, 4X2X1.2cm Pt states he is followed as an outpatient by the wound care center in [place].  Dressing procedure/placement/frequency: Topical treatment orders provided for bedside nurses to perform as follows: Cleanse bilat great toe and bilat knee wounds with Vashe wound cleanser Q day Hart Rochester [phone]), apply Vashe moistened gauze to wound beds daily. Apply Medihoney to wound beds Q day, cover with silicone foam or gauze and Kerlix roll gauze whichever is preferred. Please re-consult if further assistance is needed.  Thank-you,  Cammie Mcgee MSN, RN, CWOCN, [place], CNS [phone]

## 2023-09-08 NOTE — Progress Notes (Signed)
 Patient's rectal abscess pain is not improving with oral Percocet.  Complaining about 10 out of 10 pain.  Giving fentanyl 25 mcg once.   Tereasa Coop, MD [facility] 09/08/2023, 10:19 PM

## 2023-09-08 NOTE — Progress Notes (Addendum)
 PROGRESS NOTE  Henry Bryant WUJ:[id number] DOB: 06/07/1961 DOA: 09/06/2023 PCP: Toma Deiters, MD   LOS: 2 days   Brief Narrative / Interim history: 63 y.o. male with medical history significant of type 2 diabetes mellitus, chronic systolic CHF, hypertension, pulmonary hypertension, A-fib/flutter, recent bilateral toe amputation by podiatry about 4 weeks ago, urinary retention and self-catheterization who comes into the hospital with rectal pain and a purulent discharge in that area.  He tells me he had a fall several days ago and fell on his buttocks, was feeling fine up until 1 to 2 days ago when he started noticing increased pain and just now his seeing discharge. He presented to [facility] emergency room. Imaging there showed a large, loculated, rim-enhancing fluid collection within the left gluteus and left perirectal tissues measuring 9 x 6 x 7 0.2 x 11 cm in size, also showed emphysematous cystitis. Urinalysis concerning for UTI. He underwent an I&D in the ED, was given antibiotics. He also reports bilateral great toe osteomyelitis status post surgery few weeks back.  Subjective / 24h Interval events: Afebrile overnight, clinically he is feeling better.  He has less rectal pain today  Assesement and Plan: Principal problem Rectal abscess -General Surgery was consulted at [facility] prior to transfer, it appears that he had a bedside evaluation, he had copious amount of purulent discharge and had a small drain placed. -Area looks improved, no tenderness or erythema -General Surgery consulted to evaluate, appreciate input.  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 -Appreciate urology follow-up, no interventions planned for now -Continue antibiotics and monitor cultures  Diabetic ulcer of toes, history of osteomyelitis-status post surgery with toe  amputation about 4 weeks ago per patient, he has been bearing weight and has not had regular care for dressing changes over the last week.  Left toe wound has been packed about 8 days ago, has not had the dressing changed since -Wound care consulted, appreciate input -MRI of bilateral feet shows site of prior amputation has findings of osteomyelitis in the distal half of the first metatarsal on the left as well as on the right  Peripheral arterial disease-he is followed by vascular surgery as an outpatient, last saw Dr. Hetty Blend and underwent lower extremity angiogram with revascularization December 2024.  Given poor healing following the surgery 4 weeks ago, discussed case with Dr. Sherral Hammers and will transfer patient to [facility] for vascular surgery evaluation -Suspect he will need additional surgery/potential amputation.    Insulin-dependent diabetes mellitus, with hyperglycemia-continue Lantus, has been placed on sliding scale.  CBGs fairly acceptable  Lab Results  Component Value Date   HGBA1C 6.8 (H) 09/06/2023   CBG (last 3)  Recent Labs    09/07/23 1712 09/07/23 2217 09/08/23 0743  GLUCAP 153* 178* 243*     Chronic systolic CHF-euvolemic, continue metoprolol, continue to hold Entresto and diuretics due to soft blood pressures in the setting of an active infection   History of a flutter with RVR -appears to be on amiodarone, continue to hold Eliquis while not sure if he will need interventions, currently on full dose Lovenox   Essential hypertension-currently normotensive   PAD-history of revascularization   Scheduled Meds:  ALPRAZolam  0.5 mg Oral QHS   amiodarone  200 mg Oral Daily   Chlorhexidine Gluconate Cloth  6 each Topical Daily   enoxaparin (LOVENOX) injection  60 mg Subcutaneous Q12H  insulin aspart  0-15 Units Subcutaneous TID WC   insulin aspart  0-5 Units Subcutaneous QHS   insulin glargine-yfgn  25 Units Subcutaneous QHS   leptospermum manuka honey  1  Application Topical Daily   metoprolol succinate  25 mg Oral Daily   pantoprazole  40 mg Oral QHS   rosuvastatin  40 mg Oral Daily   tamsulosin  0.4 mg Oral Daily   Continuous Infusions:  linezolid (ZYVOX) IV     piperacillin-tazobactam (ZOSYN)  IV 3.375 g (09/08/23 1244)   PRN Meds:.acetaminophen **OR** acetaminophen, ondansetron **OR** ondansetron (ZOFRAN) IV, oxyCODONE  Current Outpatient Medications  Medication Instructions   ALPRAZolam (XANAX) 0.5 mg, Daily at bedtime   amiodarone (PACERONE) 200 mg, Daily   amoxicillin-clavulanate (AUGMENTIN) 875-125 MG tablet 1 tablet, Oral, 2 times daily with meals   apixaban (ELIQUIS) 5 mg, 2 times daily   aspirin EC 81 mg, Oral, Every morning, Swallow whole.   doxycycline (VIBRA-TABS) 100 mg, 2 times daily with meals   Fish Oil 1,000 mg, Daily   Jardiance 10 mg, Daily   Lantus SoloStar 45 Units, 2 times daily before meals   metoprolol succinate (TOPROL-XL) 25 mg, Daily   pantoprazole (PROTONIX) 40 mg, Oral, Daily   rosuvastatin (CRESTOR) 40 mg, Daily   sacubitril-valsartan (ENTRESTO) 97-103 MG 1 tablet, 2 times daily   sildenafil (REVATIO) 20 mg, Daily PRN   spironolactone (ALDACTONE) 25 mg, Daily at bedtime   tamsulosin (FLOMAX) 0.4 mg, Oral, Daily   vitamin C 1,000 mg, 2 times daily    Diet Orders (From admission, onward)     Start     Ordered   09/07/23 0847  Diet heart healthy/carb modified Room service appropriate? Yes; Fluid consistency: Thin  Diet effective now       Question Answer Comment  Diet-HS Snack? Nothing   Room service appropriate? Yes   Fluid consistency: Thin      09/07/23 0846            DVT prophylaxis:    Lab Results  Component Value Date   PLT 340 09/08/2023      Code Status: Do not attempt resuscitation (DNR) PRE-ARREST INTERVENTIONS DESIRED  Family Communication: No family at bedside  Status is: Inpatient Remains inpatient appropriate because: Severity of illness  Level of care:  Telemetry Medical  Consultants:  None  Objective: Vitals:   09/07/23 2146 09/07/23 2210 09/08/23 0430 09/08/23 1241  BP: (!) 92/47 (!) 106/52 (!) 129/55 (!) 121/55  Pulse: (!) 54 (!) 57 66 (!) 58  Resp: 19  18 20   Temp: 97.7 F (36.5 C)  98 F (36.7 C) (!) 97.5 F (36.4 C)  TempSrc: Oral  Oral Oral  SpO2: 100%  100% 98%  Weight:      Height:        Intake/Output Summary (Last 24 hours) at 09/08/2023 1314 Last data filed at 09/08/2023 1100 Gross per 24 hour  Intake 781.05 ml  Output 1800 ml  Net -1018.95 ml   Wt Readings from Last 3 Encounters:  09/06/23 63.8 kg  07/07/23 72.6 kg  05/09/23 78 kg    Examination:  Constitutional: NAD Eyes: lids and conjunctivae normal, no scleral icterus ENMT: mmm Neck: normal, supple Respiratory: clear to auscultation bilaterally, no wheezing, no crackles. Normal respiratory effort.  Cardiovascular: Regular rate and rhythm, no murmurs / rubs / gallops. No LE edema. Abdomen: soft, no distention, no tenderness. Bowel sounds positive.   Data Reviewed: I have independently reviewed following labs  and imaging studies   CBC Recent Labs  Lab 09/06/23 1829 09/07/23 0539 09/08/23 0530  WBC 13.8* 11.0* 9.1  HGB 11.2* 9.5* 8.6*  HCT 37.6* 32.2* 29.3*  PLT 434* 348 340  MCV 95.7 94.7 97.0  MCH 28.5 27.9 28.5  MCHC 29.8* 29.5* 29.4*  RDW 16.0* 16.1* 16.1*  LYMPHSABS 1.2  --   --   MONOABS 0.8  --   --   EOSABS 0.0  --   --   BASOSABS 0.0  --   --     Recent Labs  Lab 09/06/23 1829 09/07/23 0539 09/08/23 0530  NA 140 139 137  K 3.7 3.1* 3.6  CL 105 107 105  CO2 21* 21* 19*  GLUCOSE 294* 226* 258*  BUN 18 21 24*  CREATININE 0.88 1.04 1.14  CALCIUM 8.7* 8.2* 8.1*  AST 10* 13* 18  ALT 15 14 18   ALKPHOS 95 89 75  BILITOT 0.4 0.7 0.4  ALBUMIN 2.8* 2.4* 2.2*  MG 2.0  --  1.8  HGBA1C 6.8*  --   --     ------------------------------------------------------------------------------------------------------------------ No  results for input(s): "CHOL", "HDL", "LDLCALC", "TRIG", "CHOLHDL", "LDLDIRECT" in the last 72 hours.  Lab Results  Component Value Date   HGBA1C 6.8 (H) 09/06/2023   ------------------------------------------------------------------------------------------------------------------ No results for input(s): "TSH", "T4TOTAL", "T3FREE", "THYROIDAB" in the last 72 hours.  Invalid input(s): "FREET3"  Cardiac Enzymes No results for input(s): "CKMB", "TROPONINI", "MYOGLOBIN" in the last 168 hours.  Invalid input(s): "CK" ------------------------------------------------------------------------------------------------------------------ No results found for: "BNP"  CBG: Recent Labs  Lab 09/07/23 0829 09/07/23 1147 09/07/23 1712 09/07/23 2217 09/08/23 0743  GLUCAP 215* 252* 153* 178* 243*    Recent Results (from the past 240 hours)  Culture, blood (Routine X 2) w Reflex to ID Panel     Status: None (Preliminary result)   Collection Time: 09/06/23  6:26 PM   Specimen: BLOOD RIGHT ARM  Result Value Ref Range Status   Specimen Description   Final    BLOOD RIGHT ARM Performed at [facility], [address], [place], [place] [address]    Special Requests   Final    BOTTLES DRAWN AEROBIC ONLY Blood Culture adequate volume Performed at [facility], [address] [place], [place] [address]    Culture   Final    NO GROWTH 2 DAYS Performed at [facility], [address] [place], [place] [address]    Report Status PENDING  Incomplete  Culture, blood (Routine X 2) w Reflex to ID Panel     Status: None (Preliminary result)   Collection Time: 09/06/23  6:26 PM   Specimen: BLOOD RIGHT ARM  Result Value Ref Range Status   Specimen Description   Final    BLOOD RIGHT ARM Performed at [facility], [address] [place], [place] [address]    Special Requests   Final    BOTTLES DRAWN AEROBIC ONLY Blood Culture adequate volume Performed at [facility], [address], [place], [place] [address]    Culture   Final    NO GROWTH 2 DAYS Performed at [facility], [address] [place], [place] [address]    Report Status PENDING  Incomplete  Urine Culture (for pregnant, neutropenic or urologic patients or patients with an indwelling urinary catheter)     Status: Abnormal (Preliminary result)   Collection Time: 09/06/23  8:10 PM   Specimen: Urine, Catheterized  Result Value Ref Range Status   Specimen Description   Final    URINE, CATHETERIZED Performed at [facility], [address] [place], [place] [address]    Special Requests   Final    NONE Performed at [facility], [address], [place], [place] [address]    Culture (A)  Final    1,000 COLONIES/mL KLEBSIELLA PNEUMONIAE SUSCEPTIBILITIES TO FOLLOW Performed at [facility], [address], [place], [place] [address]    Report Status PENDING  Incomplete     Radiology Studies: MR TOES LEFT WO CONTRAST Result Date: 09/08/2023 CLINICAL DATA:  Recent great toe amputation with continued purulent drainage. EXAM: MRI OF THE LEFT TOES WITHOUT CONTRAST TECHNIQUE: Multiplanar, multisequence MR imaging of the left forefoot was performed. No intravenous contrast was administered. COMPARISON:  Left foot x-rays dated June 25, 2023. FINDINGS: Bones/Joint/Cartilage Prior amputation of the great toe. Prominent marrow edema with corresponding decreased T1 marrow signal involving the distal half of the first metatarsal. No fracture or dislocation. Joint spaces are preserved. No joint effusion. Ligaments Second through fifth toe collateral ligaments are intact. Lisfranc ligament is intact. Muscles and Tendons Postsurgical changes of the first flexor and extensor tendons. No tenosynovitis. Complete atrophy of the intrinsic foot muscles. Soft tissue Ulceration at the great toe amputation stump with few small foci of  subcutaneous emphysema. No fluid collection. No soft tissue mass. IMPRESSION: 1. 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 No abscess. Electronically Signed   By: Obie Dredge M.D.   On: 09/08/2023 08:19   MR TOES RIGHT WO CONTRAST Result Date: 09/08/2023 CLINICAL DATA:  Recent right great toe amputation 4 weeks ago with continued purulent drainage. EXAM: MRI OF THE RIGHT TOES WITHOUT CONTRAST TECHNIQUE: Multiplanar, multisequence MR imaging of the right forefoot was performed. No intravenous contrast was administered. COMPARISON:  MRI right foot dated July 16, 2023. FINDINGS: Bones/Joint/Cartilage Prior first ray amputation. Prominent marrow edema with corresponding decreased T1 marrow signal involving the remaining first metatarsal, most severe at the distal aspect. No fracture or dislocation. Joint spaces are preserved. No joint effusion. Ligaments Second through fifth toe collateral ligaments are intact. Lisfranc ligament is intact. Muscles and Tendons Postsurgical changes of the first flexor and extensor tendons. No tenosynovitis. Complete atrophy of the intrinsic foot muscles. Soft tissue Ulceration at the first ray amputation stump with small foci subcutaneous emphysema. No fluid collection. No soft tissue mass. IMPRESSION: 1. Prior first ray amputation with osteomyelitis of the remaining first metatarsal. No abscess. Electronically Signed   By: Obie Dredge M.D.   On: 09/08/2023 08:12    Pamella Pert, MD, PhD [facility]  Between 7 am - 7 pm I am available, please contact me via Amion (for emergencies) or Securechat (non urgent messages)  Between 7 pm - 7 am I am not available, please contact night coverage MD/APP via Amion

## 2023-09-08 NOTE — Progress Notes (Signed)
 Patient brought to [facility]E [facility] from [facility]. VSS. Telemetry box applied, CCMD notified. Patient oriented to room and staff. Call bell in reach.  Kenard Gower, RN

## 2023-09-08 NOTE — Progress Notes (Signed)
 Subjective: No acute events overnight. Afebrile last 24 hours  Outside CT scan reviewed - shows large perirectal abscess abutting prostate and bladder. Looks like the prostate was pushed anteriorly be abscess, although cannot rule out involvement at that time. There is air in the wall of the bladder. Bladder distended with bilateral hydro  Objective: Vital signs in last 24 hours: Temp:  [97.7 F (36.5 C)-98 F (36.7 C)] 98 F (36.7 C) (03/03 0430) Pulse Rate:  [54-70] 66 (03/03 0430) Resp:  [16-19] 18 (03/03 0430) BP: (92-129)/(47-55) 129/55 (03/03 0430) SpO2:  [100 %] 100 % (03/03 0430)  Intake/Output from previous day: 03/02 0701 - 03/03 0700 In: 1031.2 [P.O.:360; IV Piggyback:671.2] Out: 1000 [Urine:1000] Intake/Output this shift: No intake/output data recorded.  Physical Exam:  General: Alert and oriented CV: RRR Lungs: Clear Abdomen: rectal drain in place with purulent output; foley in place draining clear/yellow Ext: NT, No erythema  Lab Results: Recent Labs    09/06/23 1829 09/07/23 0539 09/08/23 0530  HGB 11.2* 9.5* 8.6*  HCT 37.6* 32.2* 29.3*   BMET Recent Labs    09/07/23 0539 09/08/23 0530  NA 139 137  K 3.1* 3.6  CL 107 105  CO2 21* 19*  GLUCOSE 226* 258*  BUN 21 24*  CREATININE 1.04 1.14  CALCIUM 8.2* 8.1*     Studies/Results: MR TOES LEFT WO CONTRAST Result Date: 09/08/2023 CLINICAL DATA:  Recent great toe amputation with continued purulent drainage. EXAM: MRI OF THE LEFT TOES WITHOUT CONTRAST TECHNIQUE: Multiplanar, multisequence MR imaging of the left forefoot was performed. No intravenous contrast was administered. COMPARISON:  Left foot x-rays dated June 25, 2023. FINDINGS: Bones/Joint/Cartilage Prior amputation of the great toe. Prominent marrow edema with corresponding decreased T1 marrow signal involving the distal half of the first metatarsal. No fracture or dislocation. Joint spaces are preserved. No joint effusion. Ligaments  Second through fifth toe collateral ligaments are intact. Lisfranc ligament is intact. Muscles and Tendons Postsurgical changes of the first flexor and extensor tendons. No tenosynovitis. Complete atrophy of the intrinsic foot muscles. Soft tissue Ulceration at the great toe amputation stump with few small foci of subcutaneous emphysema. No fluid collection. No soft tissue mass. IMPRESSION: 1. 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 No abscess. Electronically Signed   By: Obie Dredge M.D.   On: 09/08/2023 08:19   MR TOES RIGHT WO CONTRAST Result Date: 09/08/2023 CLINICAL DATA:  Recent right great toe amputation 4 weeks ago with continued purulent drainage. EXAM: MRI OF THE RIGHT TOES WITHOUT CONTRAST TECHNIQUE: Multiplanar, multisequence MR imaging of the right forefoot was performed. No intravenous contrast was administered. COMPARISON:  MRI right foot dated July 16, 2023. FINDINGS: Bones/Joint/Cartilage Prior first ray amputation. Prominent marrow edema with corresponding decreased T1 marrow signal involving the remaining first metatarsal, most severe at the distal aspect. No fracture or dislocation. Joint spaces are preserved. No joint effusion. Ligaments Second through fifth toe collateral ligaments are intact. Lisfranc ligament is intact. Muscles and Tendons Postsurgical changes of the first flexor and extensor tendons. No tenosynovitis. Complete atrophy of the intrinsic foot muscles. Soft tissue Ulceration at the first ray amputation stump with small foci subcutaneous emphysema. No fluid collection. No soft tissue mass. IMPRESSION: 1. Prior first ray amputation with osteomyelitis of the remaining first metatarsal. No abscess. Electronically Signed   By: Obie Dredge M.D.   On: 09/08/2023 08:12    Assessment/Plan: Henry Bryant is a 63 yo  M with chronic urinary retention, emphysematous cystitis, perirectal abscess  --continue  indwelling foley --may be worthwhile to reimage with CT cystogram at some point, this may be done inpatient or outpatient pending clinical course --urology following   LOS: 2 days   Irine Seal MD 09/08/2023, 11:10 AM [facility]  Pager: [phone]   I spent 40 total minutes on the day of the encounter including pre-visit review of the medical record, face-to-face time with the patient, and post visit ordering of labs/imaging/tests.

## 2023-09-09 ENCOUNTER — Inpatient Hospital Stay (HOSPITAL_COMMUNITY)

## 2023-09-09 DIAGNOSIS — N3080 Other cystitis without hematuria: Secondary | ICD-10-CM | POA: Diagnosis present

## 2023-09-09 DIAGNOSIS — E11621 Type 2 diabetes mellitus with foot ulcer: Secondary | ICD-10-CM

## 2023-09-09 DIAGNOSIS — K611 Rectal abscess: Secondary | ICD-10-CM | POA: Diagnosis not present

## 2023-09-09 DIAGNOSIS — I7090 Unspecified atherosclerosis: Secondary | ICD-10-CM | POA: Diagnosis not present

## 2023-09-09 DIAGNOSIS — I2510 Atherosclerotic heart disease of native coronary artery without angina pectoris: Secondary | ICD-10-CM

## 2023-09-09 DIAGNOSIS — J449 Chronic obstructive pulmonary disease, unspecified: Secondary | ICD-10-CM

## 2023-09-09 DIAGNOSIS — I10 Essential (primary) hypertension: Secondary | ICD-10-CM

## 2023-09-09 DIAGNOSIS — L97519 Non-pressure chronic ulcer of other part of right foot with unspecified severity: Secondary | ICD-10-CM

## 2023-09-09 LAB — GLUCOSE, CAPILLARY
Glucose-Capillary: 199 mg/dL — ABNORMAL HIGH (ref 70–99)
Glucose-Capillary: 297 mg/dL — ABNORMAL HIGH (ref 70–99)
Glucose-Capillary: 228 mg/dL — ABNORMAL HIGH (ref 70–99)
Glucose-Capillary: 115 mg/dL — ABNORMAL HIGH (ref 70–99)

## 2023-09-09 LAB — CBC
HCT: 28.2 % — ABNORMAL LOW (ref 39.0–52.0)
Hemoglobin: 9.2 g/dL — ABNORMAL LOW (ref 13.0–17.0)
MCH: 29.5 pg (ref 26.0–34.0)
MCHC: 32.6 g/dL (ref 30.0–36.0)
MCV: 90.4 fL (ref 80.0–100.0)
Platelets: 355 10*3/uL (ref 150–400)
RBC: 3.12 MIL/uL — ABNORMAL LOW (ref 4.22–5.81)
RDW: 15.9 % — ABNORMAL HIGH (ref 11.5–15.5)
WBC: 8 10*3/uL (ref 4.0–10.5)
nRBC: 0 % (ref 0.0–0.2)

## 2023-09-09 LAB — COMPREHENSIVE METABOLIC PANEL
ALT: 17 U/L (ref 0–44)
AST: 13 U/L — ABNORMAL LOW (ref 15–41)
Albumin: 2 g/dL — ABNORMAL LOW (ref 3.5–5.0)
Alkaline Phosphatase: 65 U/L (ref 38–126)
Anion gap: 11 (ref 5–15)
BUN: 18 mg/dL (ref 8–23)
CO2: 22 mmol/L (ref 22–32)
Calcium: 8.1 mg/dL — ABNORMAL LOW (ref 8.9–10.3)
Chloride: 105 mmol/L (ref 98–111)
Creatinine, Ser: 0.91 mg/dL (ref 0.61–1.24)
GFR, Estimated: >60 mL/min (ref >60)
Glucose, Bld: 165 mg/dL — ABNORMAL HIGH (ref 70–99)
Potassium: 3.7 mmol/L (ref 3.5–5.1)
Sodium: 138 mmol/L (ref 135–145)
Total Bilirubin: 0.4 mg/dL (ref 0.0–1.2)
Total Protein: 5.6 g/dL — ABNORMAL LOW (ref 6.5–8.1)

## 2023-09-09 LAB — MAGNESIUM: Magnesium: 1.7 mg/dL (ref 1.7–2.4)

## 2023-09-09 LAB — URINE CULTURE: Culture: 1000 — AB

## 2023-09-09 MED ORDER — INSULIN GLARGINE 100 UNIT/ML ~~LOC~~ SOLN
30.0000 [IU] | Freq: Every day | SUBCUTANEOUS | Status: DC
  Administered 2023-09-09 – 2023-09-22 (×14): 30 [IU] via SUBCUTANEOUS
  Filled 2023-09-09 (×16): qty 0.3

## 2023-09-09 MED ORDER — FENTANYL CITRATE PF 50 MCG/ML IJ SOSY
25.0000 ug | PREFILLED_SYRINGE | INTRAMUSCULAR | Status: DC | PRN
  Administered 2023-09-09 – 2023-09-22 (×23): 25 ug via INTRAVENOUS
  Filled 2023-09-09 (×24): qty 1

## 2023-09-09 MED ORDER — DOXYCYCLINE HYCLATE 100 MG PO TABS
100.0000 mg | ORAL_TABLET | Freq: Two times a day (BID) | ORAL | Status: AC
  Administered 2023-09-09 – 2023-09-15 (×14): 100 mg via ORAL
  Filled 2023-09-09 (×14): qty 1

## 2023-09-09 MED ORDER — SODIUM CHLORIDE 0.9 % IV SOLN
3.0000 g | Freq: Four times a day (QID) | INTRAVENOUS | Status: AC
  Administered 2023-09-09 – 2023-09-13 (×16): 3 g via INTRAVENOUS
  Filled 2023-09-09 (×18): qty 8

## 2023-09-09 NOTE — Plan of Care (Signed)

## 2023-09-09 NOTE — Progress Notes (Signed)
 [facility]                                                                              Henry Bryant, is a 63 y.o. male, DOB - 16-Apr-1961, ZOX:[id number] Admit date - 09/06/2023    Outpatient Primary MD for the patient is Hasanaj, Myra Gianotti, MD  LOS - 3  days  No chief complaint on file.      Brief summary   63 y.o. male with medical history significant of type 2 diabetes mellitus, chronic systolic CHF, hypertension, pulmonary hypertension, A-fib/flutter, recent bilateral toe amputation by podiatry about 4 weeks ago, urinary retention and self-catheterization who comes into the hospital with rectal pain and a purulent discharge in that area.  He tells me he had a fall several days ago and fell on his buttocks, was feeling fine up until 1 to 2 days ago when he started noticing increased pain and just now his seeing discharge. He presented to [facility] emergency room.  Imaging there showed a large, loculated, rim-enhancing fluid collection within the left gluteus and left perirectal tissues measuring 9 x 6 x 7 0.2 x 11 cm in size, also showed emphysematous cystitis. Urinalysis concerning for UTI. He underwent an I&D in the ED, was given antibiotics. He also reports bilateral great toe osteomyelitis status post surgery few weeks back.    Assessment & Plan    Principal Problem: Sepsis due to rectal abscess Klebsiella pneumonia bacteremia -Imaging at [facility] had shown large loculated collection in the left buttocks and perirectal tissues, underwent I&D there, catheter was placed into the abscess cavity to help facilitate drainage. -CT abdomen 3/3 showed significant improvement in the left perirectal and left buttock region compared to prior exam.  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 -Seen by surgery today, Dr. Dwain Sarna, recommended discharge on 10 days of antibiotics and follow-up in [facility] with surgeon who drained it -Currently on IV Zosyn and linezolid.  -Blood  cultures at [facility] positive for Klebsiella pneumonia, urine culture here on 3/1 positive for Klebsiella - requested ID consult regarding antibiotics and duration  Emphysematous cystitis, Klebsiella UTI -Seen by urology on 3/3, Dr. Lafonda Mosses, recommended continuing Foley catheter -Repeat CT abdomen showed improved emphysematous cystitis -Urine culture Klebsiella,, continue antibiotics for now, will await ID recommendations regarding antibiotics   Diabetic ulcer of toes, history of osteomyelitis -Per patient, s/p surgery with toe amputation about 4 weeks ago, has been bearing weight and not getting regular care for dressing changes over the last week prior to admission -Wound care consulted -MRI of the toes showed prior amputation of the great toe with osteomyelitis of the distal half of the first metatarsal on the left and right, no abscess.  -Dr Elvera Lennox consulted vascular surgery on 3/3, discussed with Dr. Sherral Hammers and was recommended transfer to [facility] for vascular surgery valuation.    Peripheral arterial disease with history of revascularization -Followed by vascular surgery outpatient, last saw Dr. Hetty Blend, underwent lower extremity angiogram with revascularization in 06/2023  -Given poor healing and findings of osteomyelitis, vascular surgery consulted again, awaiting recommendations    Insulin-dependent diabetes mellitus, uncontrolled with with hyperglycemia- -hemoglobin  A1c 6.8 on 3/1   CBG (last 3)  Recent Labs    09/08/23 1647 09/08/23 2100 09/09/23 0609  GLUCAP 237* 179* 199*   -Increased Lantus to 30 units at bedtime, continue SSI, moderate    Chronic systolic CHF -euvolemic,  -Currently holding Entresto, diuretics due to borderline BP -Continue metoprolol    Atrial flutter -Continue amiodarone, metoprolol -Eliquis currently on hold in anticipation of surgical intervention  Estimated body mass index is 20.18 kg/m as calculated from the following:    Height as of this encounter: 5\' 10"  (1.778 m).   Weight as of this encounter: 63.8 kg.  Code Status: DNR DVT Prophylaxis:     Level of Care: Level of care: Telemetry Medical Family Communication: Updated patient Disposition Plan:      Remains inpatient appropriate:      Procedures:    Consultants:   General surgery Urology Vascular surgery Infectious ease  Antimicrobials:   Anti-infectives (From admission, onward)    Start     Dose/Rate Route Frequency Ordered Stop   09/08/23 2000  linezolid (ZYVOX) IVPB 600 mg        600 mg 300 mL/hr over 60 Minutes Intravenous Every 12 hours 09/08/23 1117     09/07/23 0800  vancomycin (VANCOREADY) IVPB 750 mg/150 mL  Status:  Discontinued        750 mg 150 mL/hr over 60 Minutes Intravenous Every 12 hours 09/06/23 1834 09/08/23 1117   09/06/23 1930  vancomycin (VANCOCIN) IVPB 1000 mg/200 mL premix        1,000 mg 200 mL/hr over 60 Minutes Intravenous STAT 09/06/23 1834 09/06/23 2300   09/06/23 1930  piperacillin-tazobactam (ZOSYN) IVPB 3.375 g        3.375 g 12.5 mL/hr over 240 Minutes Intravenous Every 8 hours 09/06/23 1834            Medications  ALPRAZolam  0.5 mg Oral QHS   amiodarone  200 mg Oral Daily   Chlorhexidine Gluconate Cloth  6 each Topical Daily   enoxaparin (LOVENOX) injection  60 mg Subcutaneous Q12H   insulin aspart  0-15 Units Subcutaneous TID WC   insulin aspart  0-5 Units Subcutaneous QHS   insulin glargine  25 Units Subcutaneous QHS   leptospermum manuka honey  1 Application Topical Daily   metoprolol succinate  25 mg Oral Daily   pantoprazole  40 mg Oral QHS   rosuvastatin  40 mg Oral Daily   tamsulosin  0.4 mg Oral Daily      Subjective:   Henry Bryant was seen and examined today.  Rectal drain in place, no acute nausea vomiting, abdominal pain, chest pain or shortness of breath.  No fevers.  Objective:   Vitals:   09/08/23 1955 09/08/23 2326 09/09/23 0314 09/09/23 0800  BP: (!) 124/50  (!) 102/55 (!) 117/36 (!) 118/55  Pulse: (!) 57 (!) 53 60 (!) 54  Resp: 11 18 15 14   Temp: 98.2 F (36.8 C) 97.8 F (36.6 C) 98.3 F (36.8 C) 97.7 F (36.5 C)  TempSrc: Oral Oral Oral Oral  SpO2: 100% 100% 99% 99%  Weight:      Height:        Intake/Output Summary (Last 24 hours) at 09/09/2023 1012 Last data filed at 09/08/2023 1100 Gross per 24 hour  Intake --  Output 500 ml  Net -500 ml     Wt Readings from Last 3 Encounters:  09/06/23 63.8 kg  07/07/23 72.6 kg  05/09/23 78 kg  Exam General: Alert and oriented x 3, NAD Cardiovascular: S1 S2 auscultated,  RRR Respiratory: Clear to auscultation bilaterally Gastrointestinal: Soft, nontender, nondistended, + bowel sounds Ext: no pedal edema bilaterally Neuro: no new deficits Psych: Normal affect     Data Reviewed:  I have personally reviewed following labs    CBC Lab Results  Component Value Date   WBC 8.0 09/09/2023   RBC 3.12 (L) 09/09/2023   HGB 9.2 (L) 09/09/2023   HCT 28.2 (L) 09/09/2023   MCV 90.4 09/09/2023   MCH 29.5 09/09/2023   PLT 355 09/09/2023   MCHC 32.6 09/09/2023   RDW 15.9 (H) 09/09/2023   LYMPHSABS 1.2 09/06/2023   MONOABS 0.8 09/06/2023   EOSABS 0.0 09/06/2023   BASOSABS 0.0 09/06/2023     Last metabolic panel Lab Results  Component Value Date   NA 138 09/09/2023   K 3.7 09/09/2023   CL 105 09/09/2023   CO2 22 09/09/2023   BUN 18 09/09/2023   CREATININE 0.91 09/09/2023   GLUCOSE 165 (H) 09/09/2023   GFRNONAA >60 09/09/2023   CALCIUM 8.1 (L) 09/09/2023   PHOS 4.0 09/06/2023   PROT 5.6 (L) 09/09/2023   ALBUMIN 2.0 (L) 09/09/2023   BILITOT 0.4 09/09/2023   ALKPHOS 65 09/09/2023   AST 13 (L) 09/09/2023   ALT 17 09/09/2023   ANIONGAP 11 09/09/2023    CBG (last 3)  Recent Labs    09/08/23 1647 09/08/23 2100 09/09/23 0609  GLUCAP 237* 179* 199*      Coagulation Profile: No results for input(s): "INR", "PROTIME" in the last 168 hours.   Radiology Studies: I  have personally reviewed the imaging studies  CT ABDOMEN PELVIS W CONTRAST Result Date: 09/08/2023 CLINICAL DATA:  History of emphysematous cystitis and large left-sided perirectal abscess with recent incision and drainage, initial encounter EXAM: CT ABDOMEN AND PELVIS WITH CONTRAST TECHNIQUE: Multidetector CT imaging of the abdomen and pelvis was performed using the standard protocol following bolus administration of intravenous contrast. RADIATION DOSE REDUCTION: This exam was performed according to the departmental dose-optimization program which includes automated exposure control, adjustment of the mA and/or kV according to patient size and/or use of iterative reconstruction technique. CONTRAST:  80mL OMNIPAQUE IOHEXOL 300 MG/ML SOLN, 30mL OMNIPAQUE IOHEXOL 300 MG/ML SOLN COMPARISON:  None Available. FINDINGS: Lower chest: Lung bases are free of acute infiltrate or sizable effusion. Hepatobiliary: No focal liver abnormality is seen. No gallstones, gallbladder wall thickening, or biliary dilatation. Pancreas: Unremarkable. No pancreatic ductal dilatation or surrounding inflammatory changes. Spleen: Normal in size without focal abnormality. Adrenals/Urinary Tract: Adrenal glands are within normal limits. Right kidney shows a normal enhancement pattern. Some slight delay in enhancement is noted on the left. Some mild fullness of the left collecting system is noted. No discrete calculus is identified. The overall appearance is slightly improved when compare with the prior exam related to interval decompression of the bladder. The right collecting system is within normal limits. Contrast material was instilled into the bladder for CT cystogram. No findings to suggest extravasation are identified. A small amount of air is noted within the bladder related to the recent instrumentation. The previously seen changes of emphysematous cystitis have resolved in the interval although some residual bladder wall thickening is  noted. Stomach/Bowel: No obstructive or inflammatory changes of the colon are seen. The appendix is within normal limits. Small bowel and stomach are unremarkable. Vascular/Lymphatic: Aortic atherosclerosis. No enlarged abdominal or pelvic lymph nodes. Reproductive: Prostate is unremarkable. Other: No free air  or free fluid is noted within the abdomen. Musculoskeletal: No acute bony abnormality is noted. There remains a perirectal fluid collection on the left extending from the operator musculature medially and posteriorly into the left buttock. The vast majority of the previously seen fluid has been drained consistent with the given clinical history. Small drainage catheter is noted within as well as some hyperdense material in the medial left buttock which may be related to the gauze packing. The largest area of fluid is noted adjacent to the operator musculature measuring 3.0 by 1.2 cm in greatest dimension. This is a significant improved from the prior exam. IMPRESSION: Significant improvement in the degree of fluid in the left perirectal and left buttock region when compare with the prior exam. Small drainage catheter and gauze packing is noted within. A mild residual fluid collection is noted as described. CT cystogram shows no evidence of extravasation. The findings of emphysematous cystitis have resolved in the interval. Some residual bladder wall thickening is noted. Mild fullness in the left renal collecting system although improved from the prior exam follow-up decompression of the bladder. Electronically Signed   By: Alcide Clever M.D.   On: 09/08/2023 20:58   CT CYSTOGRAM PELVIS Result Date: 09/08/2023 CLINICAL DATA:  History of emphysematous cystitis and large left-sided perirectal abscess with recent incision and drainage, initial encounter EXAM: CT ABDOMEN AND PELVIS WITH CONTRAST TECHNIQUE: Multidetector CT imaging of the abdomen and pelvis was performed using the standard protocol following bolus  administration of intravenous contrast. RADIATION DOSE REDUCTION: This exam was performed according to the departmental dose-optimization program which includes automated exposure control, adjustment of the mA and/or kV according to patient size and/or use of iterative reconstruction technique. CONTRAST:  80mL OMNIPAQUE IOHEXOL 300 MG/ML SOLN, 30mL OMNIPAQUE IOHEXOL 300 MG/ML SOLN COMPARISON:  None Available. FINDINGS: Lower chest: Lung bases are free of acute infiltrate or sizable effusion. Hepatobiliary: No focal liver abnormality is seen. No gallstones, gallbladder wall thickening, or biliary dilatation. Pancreas: Unremarkable. No pancreatic ductal dilatation or surrounding inflammatory changes. Spleen: Normal in size without focal abnormality. Adrenals/Urinary Tract: Adrenal glands are within normal limits. Right kidney shows a normal enhancement pattern. Some slight delay in enhancement is noted on the left. Some mild fullness of the left collecting system is noted. No discrete calculus is identified. The overall appearance is slightly improved when compare with the prior exam related to interval decompression of the bladder. The right collecting system is within normal limits. Contrast material was instilled into the bladder for CT cystogram. No findings to suggest extravasation are identified. A small amount of air is noted within the bladder related to the recent instrumentation. 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 Stomach/Bowel: No obstructive or inflammatory changes of the colon are seen. The appendix is within normal limits. Small bowel and stomach are unremarkable. Vascular/Lymphatic: Aortic atherosclerosis. No enlarged abdominal or pelvic lymph nodes. Reproductive: Prostate is unremarkable. Other: No free air or free fluid is noted within the abdomen. Musculoskeletal: No acute bony abnormality is  noted. There remains a perirectal fluid collection on the left extending from the operator musculature medially and posteriorly into the left buttock. The vast majority of the previously seen fluid has been drained consistent with the given clinical history. Small drainage catheter is noted within as well as some hyperdense material in the medial left buttock which may be related to the gauze packing. The largest area of  fluid is noted adjacent to the operator musculature measuring 3.0 by 1.2 cm in greatest dimension. This is a significant improved from the prior exam. IMPRESSION: Significant improvement in the degree of fluid in the left perirectal and left buttock region when compare with the prior exam. Small drainage catheter and gauze packing is noted within. A mild residual fluid collection is noted as described. CT cystogram shows no evidence of extravasation. The findings of emphysematous cystitis have resolved in the interval. Some residual bladder wall thickening is noted. Mild fullness in the left renal collecting system although improved from the prior exam follow-up decompression of the bladder. Electronically Signed   By: Alcide Clever M.D.   On: 09/08/2023 20:58   MR TOES LEFT WO CONTRAST Result Date: 09/08/2023 CLINICAL DATA:  Recent great toe amputation with continued purulent drainage. EXAM: MRI OF THE LEFT TOES WITHOUT CONTRAST TECHNIQUE: Multiplanar, multisequence MR imaging of the left forefoot was performed. No intravenous contrast was administered. COMPARISON:  Left foot x-rays dated June 25, 2023. FINDINGS: Bones/Joint/Cartilage Prior amputation of the great toe. Prominent marrow edema with corresponding decreased T1 marrow signal involving the distal half of the first metatarsal. No fracture or dislocation. Joint spaces are preserved. No joint effusion. Ligaments Second through fifth toe collateral ligaments are intact. Lisfranc ligament is intact. Muscles and Tendons Postsurgical  changes of the first flexor and extensor tendons. No tenosynovitis. Complete atrophy of the intrinsic foot muscles. Soft tissue Ulceration at the great toe amputation stump with few small foci of subcutaneous emphysema. No fluid collection. No soft tissue mass. IMPRESSION: 1. 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 No abscess. Electronically Signed   By: Obie Dredge M.D.   On: 09/08/2023 08:19   MR TOES RIGHT WO CONTRAST Result Date: 09/08/2023 CLINICAL DATA:  Recent right great toe amputation 4 weeks ago with continued purulent drainage. EXAM: MRI OF THE RIGHT TOES WITHOUT CONTRAST TECHNIQUE: Multiplanar, multisequence MR imaging of the right forefoot was performed. No intravenous contrast was administered. COMPARISON:  MRI right foot dated July 16, 2023. FINDINGS: Bones/Joint/Cartilage Prior first ray amputation. Prominent marrow edema with corresponding decreased T1 marrow signal involving the remaining first metatarsal, most severe at the distal aspect. No fracture or dislocation. Joint spaces are preserved. No joint effusion. Ligaments Second through fifth toe collateral ligaments are intact. Lisfranc ligament is intact. Muscles and Tendons Postsurgical changes of the first flexor and extensor tendons. No tenosynovitis. Complete atrophy of the intrinsic foot muscles. Soft tissue Ulceration at the first ray amputation stump with small foci subcutaneous emphysema. No fluid collection. No soft tissue mass. IMPRESSION: 1. Prior first ray amputation with osteomyelitis of the remaining first metatarsal. No abscess. Electronically Signed   By: Obie Dredge M.D.   On: 09/08/2023 08:12       Tiwanna Tuch M.D. [facility] 09/09/2023, 10:12 AM  Available via Epic secure chat 7am-7pm After 7 pm, please refer to night coverage provider listed on amion.
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
## 2023-09-09 NOTE — Progress Notes (Signed)
 Subjective/Chief Complaint: Ct done, nothing more to drain, having flatus, sore, has had three hospital transfers   Objective: Vital signs in last 24 hours: Temp:  [97.5 F (36.4 C)-98.4 F (36.9 C)] 97.7 F (36.5 C) (03/04 0800) Pulse Rate:  [53-60] 54 (03/04 0800) Resp:  [11-20] 14 (03/04 0800) BP: (102-126)/(36-91) 118/55 (03/04 0800) SpO2:  [98 %-100 %] 99 % (03/04 0800) Last BM Date : 09/07/23  Intake/Output from previous day: 03/03 0701 - 03/04 0700 In: -  Out: 800 [Urine:800] Intake/Output this shift: No intake/output data recorded.  I removed the packing from abscess cavity, red rubber remains to drainage, no surrounding erythema at all, no real induration  Lab Results:  Recent Labs    09/08/23 0530 09/09/23 0456  WBC 9.1 8.0  HGB 8.6* 9.2*  HCT 29.3* 28.2*  PLT 340 355   BMET Recent Labs    09/08/23 0530 09/09/23 0456  NA 137 138  K 3.6 3.7  CL 105 105  CO2 19* 22  GLUCOSE 258* 165*  BUN 24* 18  CREATININE 1.14 0.91  CALCIUM 8.1* 8.1*   PT/INR No results for input(s): "LABPROT", "INR" in the last 72 hours. ABG No results for input(s): "PHART", "HCO3" in the last 72 hours.  Invalid input(s): "PCO2", "PO2"  Studies/Results: CT ABDOMEN PELVIS W CONTRAST Result Date: 09/08/2023 CLINICAL DATA:  History of emphysematous cystitis and large left-sided perirectal abscess with recent incision and drainage, initial encounter EXAM: CT ABDOMEN AND PELVIS WITH CONTRAST TECHNIQUE: Multidetector CT imaging of the abdomen and pelvis was performed using the standard protocol following bolus administration of intravenous contrast. RADIATION DOSE REDUCTION: This exam was performed according to the departmental dose-optimization program which includes automated exposure control, adjustment of the mA and/or kV according to patient size and/or use of iterative reconstruction technique. CONTRAST:  80mL OMNIPAQUE IOHEXOL 300 MG/ML SOLN, 30mL OMNIPAQUE IOHEXOL 300 MG/ML  SOLN COMPARISON:  None Available. FINDINGS: Lower chest: Lung bases are free of acute infiltrate or sizable effusion. Hepatobiliary: No focal liver abnormality is seen. No gallstones, gallbladder wall thickening, or biliary dilatation. Pancreas: Unremarkable. No pancreatic ductal dilatation or surrounding inflammatory changes. Spleen: Normal in size without focal abnormality. Adrenals/Urinary Tract: Adrenal glands are within normal limits. Right kidney shows a normal enhancement pattern. Some slight delay in enhancement is noted on the left. Some mild fullness of the left collecting system is noted. No discrete calculus is identified. The overall appearance is slightly improved when compare with the prior exam related to interval decompression of the bladder. The right collecting system is within normal limits. Contrast material was instilled into the bladder for CT cystogram. No findings to suggest extravasation are identified. A small amount of air is noted within the bladder related to the recent instrumentation. 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 Stomach/Bowel: No obstructive or inflammatory changes of the colon are seen. The appendix is within normal limits. Small bowel and stomach are unremarkable. Vascular/Lymphatic: Aortic atherosclerosis. No enlarged abdominal or pelvic lymph nodes. Reproductive: Prostate is unremarkable. Other: No free air or free fluid is noted within the abdomen. Musculoskeletal: No acute bony abnormality is noted. There remains a perirectal fluid collection on the left extending from the operator musculature medially and posteriorly into the left buttock. The vast majority of the previously seen fluid has been drained consistent with the given clinical history. Small drainage catheter is noted within as well as some hyperdense  material in the medial left buttock which may be related  to the gauze packing. The largest area of fluid is noted adjacent to the operator musculature measuring 3.0 by 1.2 cm in greatest dimension. This is a significant improved from the prior exam. IMPRESSION: Significant improvement in the degree of fluid in the left perirectal and left buttock region when compare with the prior exam. Small drainage catheter and gauze packing is noted within. A mild residual fluid collection is noted as described. CT cystogram shows no evidence of extravasation. The findings of emphysematous cystitis have resolved in the interval. Some residual bladder wall thickening is noted. Mild fullness in the left renal collecting system although improved from the prior exam follow-up decompression of the bladder. Electronically Signed   By: Alcide Clever M.D.   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 RADIATION DOSE REDUCTION: This exam was performed according to the departmental dose-optimization program which includes automated exposure control, adjustment of the mA and/or kV according to patient size and/or use of iterative reconstruction technique. CONTRAST:  80mL OMNIPAQUE IOHEXOL 300 MG/ML SOLN, 30mL OMNIPAQUE IOHEXOL 300 MG/ML SOLN COMPARISON:  None Available. FINDINGS: Lower chest: Lung bases are free of acute infiltrate or sizable effusion. Hepatobiliary: No focal liver abnormality is seen. No gallstones, gallbladder wall thickening, or biliary dilatation. Pancreas: Unremarkable. No pancreatic ductal dilatation or surrounding inflammatory changes. Spleen: Normal in size without focal abnormality. Adrenals/Urinary Tract:  Adrenal glands are within normal limits. Right kidney shows a normal enhancement pattern. Some slight delay in enhancement is noted on the left. Some mild fullness of the left collecting system is noted. No discrete calculus is identified. The overall appearance is slightly improved when compare with the prior exam related to interval decompression of the bladder. The right collecting system is within normal limits. Contrast material was instilled into the bladder for CT cystogram. No findings to suggest extravasation are identified. A small amount of air is noted within the bladder related to the recent instrumentation. 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 Stomach/Bowel: No obstructive or inflammatory changes of the colon are seen. The appendix is within normal limits. Small bowel and stomach are unremarkable. Vascular/Lymphatic: Aortic atherosclerosis. No enlarged abdominal or pelvic lymph nodes. Reproductive: Prostate is unremarkable. Other: No free air or free fluid is noted within the abdomen. Musculoskeletal: No acute bony abnormality is noted. There remains a perirectal fluid collection on the left extending from the operator musculature medially and posteriorly into the left buttock. The vast majority of the previously seen fluid has been drained consistent with the given clinical history. Small drainage catheter is noted within as well as some hyperdense material in the medial left buttock which may be related to the gauze packing. The largest area of fluid is noted adjacent to the operator musculature measuring 3.0 by 1.2 cm in greatest dimension. This is a significant improved from the prior exam. IMPRESSION: Significant improvement in the degree of fluid in the left perirectal and left buttock region when compare with the prior exam. Small drainage catheter and gauze packing is noted within. A mild  residual fluid collection is noted  as described. CT cystogram shows no evidence of extravasation. The findings of emphysematous cystitis have resolved in the interval. Some residual bladder wall thickening is noted. Mild fullness in the left renal collecting system although improved from the prior exam follow-up decompression of the bladder. Electronically Signed   By: Alcide Clever M.D.   On: 09/08/2023 20:58   MR TOES LEFT WO CONTRAST Result Date: 09/08/2023 CLINICAL DATA:  Recent great toe amputation with continued purulent drainage. EXAM: MRI OF THE LEFT TOES WITHOUT CONTRAST TECHNIQUE: Multiplanar, multisequence MR imaging of the left forefoot was performed. No intravenous contrast was administered. COMPARISON:  Left foot x-rays dated June 25, 2023. FINDINGS: Bones/Joint/Cartilage Prior amputation of the great toe. Prominent marrow edema with corresponding decreased T1 marrow signal involving the distal half of the first metatarsal. No fracture or dislocation. Joint spaces are preserved. No joint effusion. Ligaments Second through fifth toe collateral ligaments are intact. Lisfranc ligament is intact. Muscles and Tendons Postsurgical changes of the first flexor and extensor tendons. No tenosynovitis. Complete atrophy of the intrinsic foot muscles. Soft tissue Ulceration at the great toe amputation stump with few small foci of subcutaneous emphysema. No fluid collection. No soft tissue mass. IMPRESSION: 1. 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 No abscess. Electronically Signed   By: Obie Dredge M.D.   On: 09/08/2023 08:19   MR TOES RIGHT WO CONTRAST Result Date: 09/08/2023 CLINICAL DATA:  Recent right great toe amputation 4 weeks ago with continued purulent drainage. EXAM: MRI OF THE RIGHT TOES WITHOUT CONTRAST TECHNIQUE: Multiplanar, multisequence MR imaging of the right forefoot was performed. No intravenous contrast was administered.  COMPARISON:  MRI right foot dated July 16, 2023. FINDINGS: Bones/Joint/Cartilage Prior first ray amputation. Prominent marrow edema with corresponding decreased T1 marrow signal involving the remaining first metatarsal, most severe at the distal aspect. No fracture or dislocation. Joint spaces are preserved. No joint effusion. Ligaments Second through fifth toe collateral ligaments are intact. Lisfranc ligament is intact. Muscles and Tendons Postsurgical changes of the first flexor and extensor tendons. No tenosynovitis. Complete atrophy of the intrinsic foot muscles. Soft tissue Ulceration at the first ray amputation stump with small foci subcutaneous emphysema. No fluid collection. No soft tissue mass. IMPRESSION: 1. Prior first ray amputation with osteomyelitis of the remaining first metatarsal. No abscess. Electronically Signed   By: Obie Dredge M.D.   On: 09/08/2023 08:12    Anti-infectives: Anti-infectives (From admission, onward)    Start     Dose/Rate Route Frequency Ordered Stop   09/08/23 2000  linezolid (ZYVOX) IVPB 600 mg        600 mg 300 mL/hr over 60 Minutes Intravenous Every 12 hours 09/08/23 1117     09/07/23 0800  vancomycin (VANCOREADY) IVPB 750 mg/150 mL  Status:  Discontinued        750 mg 150 mL/hr over 60 Minutes Intravenous Every 12 hours 09/06/23 1834 09/08/23 1117   09/06/23 1930  vancomycin (VANCOCIN) IVPB 1000 mg/200 mL premix        1,000 mg 200 mL/hr over 60 Minutes Intravenous STAT 09/06/23 1834 09/06/23 2300   09/06/23 1930  piperacillin-tazobactam (ZOSYN) IVPB 3.375 g        3.375 g 12.5 mL/hr over 240 Minutes Intravenous Every 8 hours 09/06/23 1834         Assessment/Plan: Perirectal/gluteal abscess - repeat ct ok, do not think needs more drainage - wbc normal - currently draining -  packing out, drain in -can go home on abx as discussed with Dr Isidoro Donning and follow up in [place] with surgeon who drained it  -10d abx fine -will sign off  FEN: HH/CM  diet VTE: LMWH 60 mg BID ID: [id number]    - Per TRH/urology -  Emphysematous cystitis - foley per urology  IDDM diabetic foot ulcers and hx of recent amputation 4 weeks ago PAD chronic systolic CHF HTN pulmonary HTN A.Fib/flutter  Emelia Loron 09/09/2023

## 2023-09-09 NOTE — Progress Notes (Signed)
 BLE arterial duplex has been completed.    Results can be found under chart review under CV PROC. 09/09/2023 5:00 PM Cloy Cozzens RVT, RDMS
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
## 2023-09-10 ENCOUNTER — Inpatient Hospital Stay (HOSPITAL_COMMUNITY)

## 2023-09-10 DIAGNOSIS — I7090 Unspecified atherosclerosis: Secondary | ICD-10-CM | POA: Diagnosis not present

## 2023-09-10 DIAGNOSIS — E11621 Type 2 diabetes mellitus with foot ulcer: Secondary | ICD-10-CM | POA: Diagnosis not present

## 2023-09-10 DIAGNOSIS — K611 Rectal abscess: Secondary | ICD-10-CM | POA: Diagnosis not present

## 2023-09-10 DIAGNOSIS — R7881 Bacteremia: Secondary | ICD-10-CM

## 2023-09-10 DIAGNOSIS — I739 Peripheral vascular disease, unspecified: Secondary | ICD-10-CM | POA: Diagnosis not present

## 2023-09-10 DIAGNOSIS — I484 Atypical atrial flutter: Secondary | ICD-10-CM

## 2023-09-10 DIAGNOSIS — A4159 Other Gram-negative sepsis: Secondary | ICD-10-CM | POA: Diagnosis not present

## 2023-09-10 DIAGNOSIS — I5022 Chronic systolic (congestive) heart failure: Secondary | ICD-10-CM

## 2023-09-10 DIAGNOSIS — M86279 Subacute osteomyelitis, unspecified ankle and foot: Secondary | ICD-10-CM | POA: Diagnosis not present

## 2023-09-10 DIAGNOSIS — E1165 Type 2 diabetes mellitus with hyperglycemia: Secondary | ICD-10-CM | POA: Diagnosis present

## 2023-09-10 DIAGNOSIS — N3080 Other cystitis without hematuria: Secondary | ICD-10-CM | POA: Diagnosis not present

## 2023-09-10 DIAGNOSIS — B961 Klebsiella pneumoniae [K. pneumoniae] as the cause of diseases classified elsewhere: Secondary | ICD-10-CM | POA: Diagnosis present

## 2023-09-10 DIAGNOSIS — Z66 Do not resuscitate: Secondary | ICD-10-CM | POA: Insufficient documentation

## 2023-09-10 LAB — RENAL FUNCTION PANEL
Albumin: 2.1 g/dL — ABNORMAL LOW (ref 3.5–5.0)
Anion gap: 9 (ref 5–15)
BUN: 14 mg/dL (ref 8–23)
CO2: 21 mmol/L — ABNORMAL LOW (ref 22–32)
Calcium: 8 mg/dL — ABNORMAL LOW (ref 8.9–10.3)
Chloride: 108 mmol/L (ref 98–111)
Creatinine, Ser: 0.94 mg/dL (ref 0.61–1.24)
GFR, Estimated: >60 mL/min (ref >60)
Glucose, Bld: 133 mg/dL — ABNORMAL HIGH (ref 70–99)
Phosphorus: 3.4 mg/dL (ref 2.5–4.6)
Potassium: 3.8 mmol/L (ref 3.5–5.1)
Sodium: 138 mmol/L (ref 135–145)

## 2023-09-10 LAB — BLOOD CULTURE ID PANEL (REFLEXED) - BCID2

## 2023-09-10 LAB — CBC
HCT: 29.8 % — ABNORMAL LOW (ref 39.0–52.0)
Hemoglobin: 9.5 g/dL — ABNORMAL LOW (ref 13.0–17.0)
MCH: 28.4 pg (ref 26.0–34.0)
MCHC: 31.9 g/dL (ref 30.0–36.0)
MCV: 89 fL (ref 80.0–100.0)
Platelets: 384 10*3/uL (ref 150–400)
RBC: 3.35 MIL/uL — ABNORMAL LOW (ref 4.22–5.81)
RDW: 15.5 % (ref 11.5–15.5)
WBC: 9.1 10*3/uL (ref 4.0–10.5)
nRBC: 0 % (ref 0.0–0.2)

## 2023-09-10 LAB — VAS US ABI WITH/WO TBI
Left ABI: 1
Right ABI: 0.63

## 2023-09-10 LAB — GLUCOSE, CAPILLARY
Glucose-Capillary: 145 mg/dL — ABNORMAL HIGH (ref 70–99)
Glucose-Capillary: 209 mg/dL — ABNORMAL HIGH (ref 70–99)
Glucose-Capillary: 161 mg/dL — ABNORMAL HIGH (ref 70–99)
Glucose-Capillary: 200 mg/dL — ABNORMAL HIGH (ref 70–99)

## 2023-09-10 MED ORDER — SACUBITRIL-VALSARTAN 24-26 MG PO TABS
1.0000 | ORAL_TABLET | Freq: Two times a day (BID) | ORAL | Status: DC
  Administered 2023-09-10 – 2023-09-23 (×26): 1 via ORAL
  Filled 2023-09-10 (×28): qty 1

## 2023-09-10 NOTE — Assessment & Plan Note (Signed)
 09-10-2023 likely due to cystitis with Klebsiella. On IV unasyn per ID

## 2023-09-10 NOTE — Assessment & Plan Note (Signed)
 09-10-2023 had prior toe amputation at [facility] in 08/2023 by general surgery. Vascular surgery wants opinion regarding pt's foot wound. Have consulted orthopedics([facility]) to evaluate.

## 2023-09-10 NOTE — Progress Notes (Signed)
 Received call from [facility] notified pt had 9 beats of v tach. Pt asymptomatic. Will continue to monitor the pt.  Lawson Radar, RN

## 2023-09-10 NOTE — Assessment & Plan Note (Signed)
 Prior to 09-10-2023: Seen by urology on 3/3, Dr. 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 Will likely go home with foley. Will help keep his buttock wound cleaner.

## 2023-09-10 NOTE — Progress Notes (Addendum)
  Progress Note    09/10/2023 8:06 AM * No surgery found *  Subjective:  no complaints   Vitals:   09/10/23 0332 09/10/23 0724  BP: (!) 149/58 (!) 134/51  Pulse: 60 64  Resp: 15 13  Temp: 98.1 F (36.7 C) 98.3 F (36.8 C)  SpO2: 97%    Physical Exam: Lungs:  non labored Incisions:  dressings left in place BLE Extremities:  palpable PT pulses L better than R Neurologic: A&O  CBC    Component Value Date/Time   WBC 9.1 09/10/2023 0335   RBC 3.35 (L) 09/10/2023 0335   HGB 9.5 (L) 09/10/2023 0335   HCT 29.8 (L) 09/10/2023 0335   PLT 384 09/10/2023 0335   MCV 89.0 09/10/2023 0335   MCH 28.4 09/10/2023 0335   MCHC 31.9 09/10/2023 0335   RDW 15.5 09/10/2023 0335   LYMPHSABS 1.2 09/06/2023 1829   MONOABS 0.8 09/06/2023 1829   EOSABS 0.0 09/06/2023 1829   BASOSABS 0.0 09/06/2023 1829    BMET    Component Value Date/Time   NA 138 09/10/2023 0335   K 3.8 09/10/2023 0335   CL 108 09/10/2023 0335   CO2 21 (L) 09/10/2023 0335   GLUCOSE 133 (H) 09/10/2023 0335   BUN 14 09/10/2023 0335   CREATININE 0.94 09/10/2023 0335   CALCIUM 8.0 (L) 09/10/2023 0335   GFRNONAA >60 09/10/2023 0335    INR No results found for: "INR"   Intake/Output Summary (Last 24 hours) at 09/10/2023 5366 Last data filed at 09/10/2023 0600 Gross per 24 hour  Intake 1087.45 ml  Output 2100 ml  Net -1012.55 ml     Assessment/Plan:  63 y.o. male with slow to heal GT amps; osteomyelitis  BLE with palpable PT pulses Duplex demonstrates patent R PT and TP trunk which was treated endo in December; Will discuss with Dr. Hetty Blend if patient is optimized from a vascular standpoint Podiatry consulted for wound care vs further surgical debridement  Emilie Rutter, PA-C [facility] [phone] 09/10/2023 8:06 AM   VASCULAR STAFF ADDENDUM: I have independently interviewed and examined the patient. I agree with the above.  He has palpable PTs bilaterally and is optimized from a  vascular standpoint.  Recommend podiatry consult for possible debridement due to his remaining osteomyelitis in his amputation sites.  Daria Pastures MD [facility] Phone Number: [phone] 09/10/2023 8:23 AM

## 2023-09-10 NOTE — Assessment & Plan Note (Signed)
 09-10-2023 sepsis present on admission.

## 2023-09-10 NOTE — Progress Notes (Signed)
 PHARMACY - PHYSICIAN COMMUNICATION CRITICAL VALUE ALERT - BLOOD CULTURE IDENTIFICATION (BCID)  Henry Bryant is an 63 y.o. male who presented to [facility] on 09/06/2023 with a chief complaint of buttock pain.  Assessment:  Has been treated for rectal abscess and emphysematous cystitis, being followed by ID for Klebsiella pneumoniae bacteremia on OSH blood cx, now growing Kleb pneumo in blood cx here (drawn 3/1).  Name of physician (or Provider) ContactedOrson Slick MD  Current antibiotics: Unasyn and doxycycline  Changes to prescribed antibiotics recommended:  Patient is on recommended antibiotics - No changes needed  Results for orders placed or performed during the hospital encounter of 09/06/23  Blood Culture ID Panel (Reflexed) (Collected: 09/06/2023  6:26 PM)  Result Value Ref Range   Enterococcus faecalis NOT DETECTED NOT DETECTED   Enterococcus Faecium NOT DETECTED NOT DETECTED   Listeria monocytogenes NOT DETECTED NOT DETECTED   Staphylococcus species NOT DETECTED NOT DETECTED   Staphylococcus aureus (BCID) NOT DETECTED NOT DETECTED   Staphylococcus epidermidis NOT DETECTED NOT DETECTED   Staphylococcus lugdunensis NOT DETECTED NOT DETECTED   Streptococcus species NOT DETECTED NOT DETECTED   Streptococcus agalactiae NOT DETECTED NOT DETECTED   Streptococcus pneumoniae NOT DETECTED NOT DETECTED   Streptococcus pyogenes NOT DETECTED NOT DETECTED   A.calcoaceticus-baumannii NOT DETECTED NOT DETECTED   Bacteroides fragilis NOT DETECTED NOT DETECTED   Enterobacterales DETECTED (A) NOT DETECTED   Enterobacter cloacae complex NOT DETECTED NOT DETECTED   Escherichia coli NOT DETECTED NOT DETECTED   Klebsiella aerogenes NOT DETECTED NOT DETECTED   Klebsiella oxytoca NOT DETECTED NOT DETECTED   Klebsiella pneumoniae DETECTED (A) NOT DETECTED   Proteus species NOT DETECTED NOT DETECTED   Salmonella species NOT DETECTED NOT DETECTED   Serratia marcescens NOT DETECTED NOT DETECTED    Haemophilus influenzae NOT DETECTED NOT DETECTED   Neisseria meningitidis NOT DETECTED NOT DETECTED   Pseudomonas aeruginosa NOT DETECTED NOT DETECTED   Stenotrophomonas maltophilia NOT DETECTED NOT DETECTED   Candida albicans NOT DETECTED NOT DETECTED   Candida auris NOT DETECTED NOT DETECTED   Candida glabrata NOT DETECTED NOT DETECTED   Candida krusei NOT DETECTED NOT DETECTED   Candida parapsilosis NOT DETECTED NOT DETECTED   Candida tropicalis NOT DETECTED NOT DETECTED   Cryptococcus neoformans/gattii NOT DETECTED NOT DETECTED   CTX-M ESBL NOT DETECTED NOT DETECTED   Carbapenem resistance IMP NOT DETECTED NOT DETECTED   Carbapenem resistance KPC NOT DETECTED NOT DETECTED   Carbapenem resistance NDM NOT DETECTED NOT DETECTED   Carbapenem resist OXA 48 LIKE NOT DETECTED NOT DETECTED   Carbapenem resistance VIM NOT DETECTED NOT DETECTED    Vernard Gambles, PharmD, BCPS  09/10/2023  1:42 AM

## 2023-09-10 NOTE — Progress Notes (Signed)
 PROGRESS NOTE    Henry Bryant  OZH:[id number] DOB: [birth year] DOA: 09/06/2023 PCP: Henry Deiters, MD  Subjective: Pt seen and examined. Pt's wife also admitted to hospital.  Surgery ok with pt's wound. Pt does not need surgical debridement of chronic wounds. Surgery says pt can go home with abx.  Vascular surgery wanted podiatry to see pt's foot wounds. Pt is not an established podiatry patient so he will need to go to unassigned orthopedics to see. Henry Bryant will see later today.  ID wants orthopedics to weigh in before giving final abx recs.   Hospital Course: HPI: Henry Bryant is a 63 y.o. male with medical history significant of type 2 diabetes mellitus, chronic systolic CHF, hypertension, pulmonary hypertension, A-fib/flutter, recent bilateral toe amputation by podiatry about 4 weeks ago, urinary retention and self-catheterization who comes into the hospital with rectal pain and a purulent discharge in that area.  He tells me he had a fall several days ago and fell on his buttocks, was feeling fine up until 1 to 2 days ago when he started noticing increased pain and just now his seeing discharge.  He denies any fever or chills, denies any abdominal pain, no nausea or vomiting.  He has followed regularly with his podiatrist for dressing changes and reports no other issues.  He presented to [facility] emergency room.  Imaging there showed a large, loculated, rim-enhancing fluid collection within the left gluteus and left perirectal tissues measuring 9 x 6 x 7 0.2 x 11 cm in size, also showed emphysematous cystitis.  Urinalysis concerning for UTI.  He underwent an I&D in the ED, was given antibiotics.  Patient gets his urological care here, Dr. Apolinar Bryant contacted and patient was transferred to [facility] long for further evaluation.  He had a Foley catheter placed   Significant Events: Admitted 09/06/2023 for rectal abscess   Significant Labs:   Significant Imaging Studies: MRI right  foot/toes Prior first ray amputation with osteomyelitis of the remaining first metatarsal. No abscess. MRI left foot/toes Prior amputation of the great toe with osteomyelitis of the distal half of the first metatarsal. No abscess. CT abd/pelvis.cystogram Significant  improvement in the degree of fluid in the left perirectal and left buttock region when compare with the prior exam. Small drainage catheter and gauze packing is noted within. A mild residual fluid collection is noted as described. CT cystogram shows no evidence of  extravasation. The findings of emphysematous cystitis have resolved in the interval. Some residual bladder wall thickening is noted. Mild fullness in the left renal collecting system although improved from the prior exam follow-up decompression of the bladder.  Antibiotic Therapy: Anti-infectives (From admission, onward)    Start     Dose/Rate Route Frequency Ordered Stop   09/09/23 1300  Ampicillin-Sulbactam (UNASYN) 3 g in sodium chloride 0.9 % 100 mL IVPB        3 g 200 mL/hr over 30 Minutes Intravenous Every 6 hours 09/09/23 1204     09/09/23 1300  doxycycline (VIBRA-TABS) tablet 100 mg        100 mg Oral Every 12 hours 09/09/23 1204     09/08/23 2000  linezolid (ZYVOX) IVPB 600 mg  Status:  Discontinued        600 mg 300 mL/hr over 60 Minutes Intravenous Every 12 hours 09/08/23 1117 09/09/23 1204   09/07/23 0800  vancomycin (VANCOREADY) IVPB 750 mg/150 mL  Status:  Discontinued        750 mg  150 mL/hr over 60 Minutes Intravenous Every 12 hours 09/06/23 1834 09/08/23 1117   09/06/23 1930  vancomycin (VANCOCIN) IVPB 1000 mg/200 mL premix        1,000 mg 200 mL/hr over 60 Minutes Intravenous STAT 09/06/23 1834 09/06/23 2300   09/06/23 1930  piperacillin-tazobactam (ZOSYN) IVPB 3.375 g  Status:  Discontinued        3.375 g 12.5 mL/hr over 240 Minutes Intravenous Every 8 hours 09/06/23 1834 09/09/23 1204       Procedures:   Consultants: Urology General  surgery Vascular surgery Infectious disease    Assessment and Plan: * Rectal abscess Prior to 09-10-2023: -Imaging at [facility] had shown large loculated collection in the left buttocks and perirectal tissues, underwent I&D there, catheter was placed into the abscess cavity to help facilitate drainage. -CT abdomen 3/3 showed significant improvement in the left perirectal and left buttock region compared to prior exam.  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 -Seen by surgery today, Henry Bryant, recommended discharge on 10 days of antibiotics and follow-up in [facility] with surgeon who drained it -Currently on IV Zosyn and linezolid.  -Blood cultures at [facility] positive for Klebsiella pneumonia, urine culture here on 3/1 positive for Klebsiella - requested ID consult regarding antibiotics and duration  09-10-2023 ID changed ABX to unasyn. Surgery does not think pt needs any additional debridement and can f/u with [facility] surgeon.  Sepsis due to Klebsiella [facility]) 09-10-2023 sepsis present on admission.  Bacteremia due to Klebsiella pneumoniae 09-10-2023 likely due to cystitis with Klebsiella. 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 Will likely go home with foley. Will help keep his buttock wound cleaner.  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 Vascular surgery wants opinion regarding pt's foot wound. Have consulted orthopedics(Henry Bryant) to evaluate.  Uncontrolled type 2 diabetes mellitus with hyperglycemia, without long-term current use of insulin (HCC) 09-10-2023 on lantus and SSI. A1c of 9% last month at [facility]. Indicating poor  outpatient control.  Atypical atrial flutter (HCC) 09-10-2023 on amiodarone. Unclear if he has been taking Eliquis. Holding systemic AC for now until it is determined he does not need any more wound debridement on his feet.  Chronic systolic CHF (congestive heart failure) (HCC) - 20-25% by echo 06-2023 at [facility] 09-10-2023 BP stable. On Toprol-XL. Unclear if he has been taking any of his CHF GDMT meds.  Will start low dose entresto 24-26 bid. Monitor SCr  Coronary artery disease 09-10-2023 stable. On Toprol-xl and crestor.  Essential hypertension 09-10-2023 stable. On Toprol-XL 25 mg daily.  COPD GOLD II with reversibility  09-10-2023 chronic   DVT prophylaxis:   Lovenox   Code Status: Do not attempt resuscitation (DNR) PRE-ARREST INTERVENTIONS DESIRED Family Communication: no family at bedside Disposition Plan: SNF vs home Reason for continuing need for hospitalization: remains on IV Abx.  Objective: Vitals:   09/09/23 2326 09/10/23 0332 09/10/23 0724 09/10/23 1252  BP: (!) 131/47 (!) 149/58 (!) 134/51 118/86  Pulse: 60 60 64 61  Resp: 14 15 13 17   Temp: 98 F (36.7 C) 98.1 F (36.7 C) 98.3 F (36.8 C) 97.8 F (36.6 C)  TempSrc: Oral Oral Oral Oral  SpO2: 98% 97%  93%  Weight:      Height:        Intake/Output Summary (Last 24 hours) at 09/10/2023 1412 Last data filed at 09/10/2023 0600 Gross per 24 hour  Intake 1087.45 ml  Output 1000 ml  Net 87.45 ml   Filed Weights   09/06/23 1751  Weight: 63.8 kg    Examination:  Physical Exam Vitals and nursing note reviewed.  Constitutional:      Comments: Appears chronically ill Lying on right side  HENT:     Head: Normocephalic and atraumatic.     Nose: Nose normal.  Cardiovascular:     Rate and Rhythm: Normal rate and regular rhythm.     Pulses: Normal pulses.  Pulmonary:     Effort: Pulmonary effort is normal.     Breath sounds: Normal breath sounds.  Abdominal:     General: Abdomen is flat. Bowel sounds  are normal.  Genitourinary:    Comments: +foley Musculoskeletal:     Comments: Large dressing over his buttocks/sacrum  Skin:    General: Skin is warm and dry.     Capillary Refill: Capillary refill takes less than 2 seconds.  Neurological:     Mental Status: He is oriented to person, place, and time.    Data Reviewed: I have personally reviewed following labs and imaging studies  CBC: Recent Labs  Lab 09/06/23 1829 09/07/23 0539 09/08/23 0530 09/09/23 0456 09/10/23 0335  WBC 13.8* 11.0* 9.1 8.0 9.1  NEUTROABS 11.7*  --   --   --   --   HGB 11.2* 9.5* 8.6* 9.2* 9.5*  HCT 37.6* 32.2* 29.3* 28.2* 29.8*  MCV 95.7 94.7 97.0 90.4 89.0  PLT 434* 348 340 355 384   Basic Metabolic Panel: Recent Labs  Lab 09/06/23 1829 09/07/23 0539 09/08/23 0530 09/09/23 0456 09/10/23 0335  NA 140 139 137 138 138  K 3.7 3.1* 3.6 3.7 3.8  CL 105 107 105 105 108  CO2 21* 21* 19* 22 21*  GLUCOSE 294* 226* 258* 165* 133*  BUN 18 21 24* 18 14  CREATININE 0.88 1.04 1.14 0.91 0.94  CALCIUM 8.7* 8.2* 8.1* 8.1* 8.0*  MG 2.0  --  1.8 1.7  --   PHOS 4.0  --   --   --  3.4   GFR: Estimated Creatinine Clearance: 71.6 mL/min (by C-G formula based on SCr of 0.94 mg/dL). Liver Function Tests: Recent Labs  Lab 09/06/23 1829 09/07/23 0539 09/08/23 0530 09/09/23 0456 09/10/23 0335  AST 10* 13* 18 13*  --   ALT 15 14 18 17   --   ALKPHOS 95 89 75 65  --   BILITOT 0.4 0.7 0.4 0.4  --   PROT 7.5 6.5 5.9* 5.6*  --   ALBUMIN 2.8* 2.4* 2.2* 2.0* 2.1*   CBG: Recent Labs  Lab 09/09/23 1406 09/09/23 1635 09/09/23 2111 09/10/23 0609 09/10/23 1110  GLUCAP 297* 228* 115* 145* 209*   Recent Results (from the past 240 hours)  Culture, blood (Routine X 2) w Reflex to ID Panel     Status: None (Preliminary result)   Collection Time: 09/06/23  6:26 PM   Specimen: BLOOD RIGHT ARM  Result Value Ref Range Status   Specimen Description   Final    BLOOD RIGHT ARM Performed at [facility],  [address], [place], [place] [address]    Special Requests   Final    BOTTLES DRAWN AEROBIC ONLY Blood Culture adequate volume Performed at [facility]  [facility], [address], [place], [place] [address]    Culture   Final    NO GROWTH 4 DAYS Performed at [facility], [address], [place], [place] [address]    Report Status PENDING  Incomplete  Culture, blood (Routine X 2) w Reflex to ID Panel     Status: Abnormal (Preliminary result)   Collection Time: 09/06/23  6:26 PM   Specimen: BLOOD RIGHT ARM  Result Value Ref Range Status   Specimen Description   Final    BLOOD RIGHT ARM Performed at [facility], [address], [place], [place] [address]    Special Requests   Final    BOTTLES DRAWN AEROBIC ONLY Blood Culture adequate volume Performed at [facility], [address] [place], [place] [address]    Culture  Setup Time   Final    GRAM NEGATIVE RODS AEROBIC BOTTLE ONLY Organism ID to follow CRITICAL RESULT CALLED TO, READ BACK BY AND VERIFIED WITH: V BRYK,[email or web address]  09/10/23 MK    Culture (A)  Final    KLEBSIELLA PNEUMONIAE SUSCEPTIBILITIES TO FOLLOW Performed at [facility], [address] [place], [place] [address]    Report Status PENDING  Incomplete  Blood Culture ID Panel (Reflexed)     Status: Abnormal   Collection Time: 09/06/23  6:26 PM  Result Value Ref Range Status   Enterococcus faecalis NOT DETECTED NOT DETECTED Final   Enterococcus Faecium NOT DETECTED NOT DETECTED Final   Listeria monocytogenes NOT DETECTED NOT DETECTED Final   Staphylococcus species NOT DETECTED NOT DETECTED Final   Staphylococcus aureus (BCID) NOT DETECTED NOT DETECTED Final   Staphylococcus epidermidis NOT DETECTED NOT DETECTED Final   Staphylococcus lugdunensis NOT DETECTED NOT DETECTED Final   Streptococcus species NOT DETECTED NOT DETECTED Final   Streptococcus agalactiae NOT DETECTED NOT DETECTED Final   Streptococcus  pneumoniae NOT DETECTED NOT DETECTED Final   Streptococcus pyogenes NOT DETECTED NOT DETECTED Final   A.calcoaceticus-baumannii NOT DETECTED NOT DETECTED Final   Bacteroides fragilis NOT DETECTED NOT DETECTED Final   Enterobacterales DETECTED (A) NOT DETECTED Final    Comment: Enterobacterales represent a large order of gram negative bacteria, not a single organism. CRITICAL RESULT CALLED TO, READ BACK BY AND VERIFIED WITH: V BRYK,PHARMD@[email or web address]  09/10/23 MK    Enterobacter cloacae complex NOT DETECTED NOT DETECTED Final   Escherichia coli NOT DETECTED NOT DETECTED Final   Klebsiella aerogenes NOT DETECTED NOT DETECTED Final   Klebsiella oxytoca NOT DETECTED NOT DETECTED Final   Klebsiella pneumoniae DETECTED (A) NOT DETECTED Final    Comment: CRITICAL RESULT CALLED TO, READ BACK BY AND VERIFIED WITH: V BRYK,PHARMD@[email or web address]  09/10/23 MK    Proteus species NOT DETECTED NOT DETECTED Final   Salmonella species NOT DETECTED NOT DETECTED Final   Serratia marcescens NOT DETECTED NOT DETECTED Final   Haemophilus influenzae NOT DETECTED NOT DETECTED Final   Neisseria meningitidis NOT DETECTED NOT DETECTED Final   Pseudomonas aeruginosa NOT DETECTED NOT DETECTED Final   Stenotrophomonas maltophilia NOT DETECTED NOT DETECTED Final   Candida albicans NOT DETECTED NOT DETECTED Final   Candida auris NOT DETECTED NOT DETECTED Final   Candida glabrata NOT DETECTED NOT DETECTED Final   Candida krusei NOT DETECTED NOT DETECTED Final   Candida parapsilosis NOT DETECTED NOT DETECTED Final   Candida tropicalis NOT DETECTED NOT DETECTED Final   Cryptococcus neoformans/gattii NOT DETECTED NOT DETECTED Final   CTX-M ESBL NOT DETECTED NOT DETECTED Final  Carbapenem resistance IMP NOT DETECTED NOT DETECTED Final   Carbapenem resistance KPC NOT DETECTED NOT DETECTED Final   Carbapenem resistance NDM NOT DETECTED NOT DETECTED Final   Carbapenem resist OXA 48 LIKE NOT DETECTED NOT DETECTED Final   Carbapenem  resistance VIM NOT DETECTED NOT DETECTED Final    Comment: Performed at [facility], [address], [place], [place] [address]  Urine Culture (for pregnant, neutropenic or urologic patients or patients with an indwelling urinary catheter)     Status: Abnormal   Collection Time: 09/06/23  8:10 PM   Specimen: Urine, Catheterized  Result Value Ref Range Status   Specimen Description   Final    URINE, CATHETERIZED Performed at [facility], [address] [place], [place] [address]    Special Requests   Final    NONE Performed at [facility], [address] [place], [place] [address]    Culture 1,000 COLONIES/mL KLEBSIELLA PNEUMONIAE (A)  Final   Report Status 09/09/2023 FINAL  Final   Organism ID, Bacteria KLEBSIELLA PNEUMONIAE (A)  Final      Susceptibility   Klebsiella pneumoniae - MIC*    AMPICILLIN >=32 RESISTANT Resistant     CEFAZOLIN <=4 SENSITIVE Sensitive     CEFEPIME <=0.12 SENSITIVE Sensitive     CEFTRIAXONE <=0.25 SENSITIVE Sensitive     CIPROFLOXACIN <=0.25 SENSITIVE Sensitive     GENTAMICIN <=1 SENSITIVE Sensitive     IMIPENEM 0.5 SENSITIVE Sensitive     NITROFURANTOIN <=16 SENSITIVE Sensitive     TRIMETH/SULFA <=20 SENSITIVE Sensitive     AMPICILLIN/SULBACTAM 8 SENSITIVE Sensitive     PIP/TAZO 8 SENSITIVE Sensitive ug/mL    * 1,000 COLONIES/mL KLEBSIELLA PNEUMONIAE     Radiology Studies: VAS [place] ABI WITH/WO TBI Result Date: 09/10/2023  LOWER EXTREMITY DOPPLER STUDY Patient Name:  BRAYAM BOEKE  Date of Exam:   09/10/2023 Medical Rec #: [id number]         Accession #:    [id number] Date of Birth: Nov 25, 1960          Patient Gender: M Patient Age:   63 years Exam Location:  [facility] Procedure:      VAS [place] ABI WITH/WO TBI Referring Phys: Emilie Rutter --------------------------------------------------------------------------------  Indications: Peripheral artery disease. BLE great toe amputations recently, non               healing surgical site. High Risk Factors: Hypertension, hyperlipidemia, coronary artery disease. Other Factors: CHF,.  Vascular               RLE ateriogram w/ TPT & PT balloon angioplasty Interventions:         07/07/2023. Comparison Study: 06/26/2023 @[facility]  Healthcare Performing Technologist: Ernestene Mention RVT, RDMS  Examination Guidelines: A complete evaluation includes at minimum, Doppler waveform signals and systolic blood pressure reading at the level of bilateral brachial, anterior tibial, and posterior tibial arteries, when vessel segments are accessible. Bilateral testing is considered an integral part of a complete examination. Photoelectric Plethysmograph (PPG) waveforms and toe systolic pressure readings are included as required and additional duplex testing as needed. Limited examinations for reoccurring indications may be performed as noted.  ABI Findings: +---------+------------------+-----+----------+--------+ Right    Rt Pressure (mmHg)IndexWaveform  Comment  +---------+------------------+-----+----------+--------+ Brachial 130                    triphasic          +---------+------------------+-----+----------+--------+ PTA  87                0.63 monophasic         +---------+------------------+-----+----------+--------+ DP       0                 0.00 absent             +---------+------------------+-----+----------+--------+ Great Toe                                 amp.     +---------+------------------+-----+----------+--------+ +--------+------------------+-----+-----------------+-------+ Left    Lt Pressure (mmHg)IndexWaveform         Comment +--------+------------------+-----+-----------------+-------+ [id number]                    triphasic                +--------+------------------+-----+-----------------+-------+ PTA     139               1.00 audibly triphasic        +--------+------------------+-----+-----------------+-------+ DP       131               0.94 monophasic             +--------+------------------+-----+-----------------+-------+ +-------+-----------+-----------+------------+------------+ ABI/TBIToday's ABIToday's TBIPrevious ABIPrevious TBI +-------+-----------+-----------+------------+------------+ Right  0.63       amputation 0.54        0.34         +-------+-----------+-----------+------------+------------+ Left   1.00       amputation 0.77        0.27         +-------+-----------+-----------+------------+------------+   difficulty obtaining waveform due to patient position and bandages.  Summary: Right: Resting right ankle-brachial index indicates moderate right lower extremity arterial disease. Left: Resting left ankle-brachial index is within normal range. *See table(s) above for measurements and observations.  Electronically signed by Sherald Hess MD on 09/10/2023 at 12:52:12 PM.    Final    VAS [place] LOWER EXTREMITY ARTERIAL DUPLEX Result Date: 09/10/2023 LOWER EXTREMITY ARTERIAL DUPLEX STUDY Patient Name:  KELVYN SCHUNK  Date of Exam:   09/09/2023 Medical Rec #: [id number]         Accession #:    [id number] Date of Birth: 1960-10-04          Patient Gender: M Patient Age:   82 years Exam Location:  [facility] Procedure:      VAS [place] LOWER EXTREMITY ARTERIAL DUPLEX Referring Phys: Emilie Rutter --------------------------------------------------------------------------------  Indications: Peripheral artery disease. High Risk Factors: Hypertension, hyperlipidemia, Diabetes, past history of                    smoking, coronary artery disease. Other Factors: CHF,.  Vascular               RLE ateriogram w/ TPT & PT balloon angioplasty Interventions:         07/07/2023. Current ABI:           n/a Limitations: Difficult exam due to habitus Comparison Study: No previous arterial duplex exams Performing Technologist: Jody Hill RVT, RDMS  Examination Guidelines: A complete evaluation includes  B-mode imaging, spectral Doppler, color Doppler, and power Doppler as needed of all accessible portions of each vessel. Bilateral testing is considered an integral part of a complete examination. Limited examinations for reoccurring indications may be performed as noted.  +-----------+--------+-----+---------------+-------------------+--------------+ RIGHT  PSV cm/sRatioStenosis       Waveform           Comments       +-----------+--------+-----+---------------+-------------------+--------------+ CFA Mid    118                         biphasic                          +-----------+--------+-----+---------------+-------------------+--------------+ DFA        95                          biphasic                          +-----------+--------+-----+---------------+-------------------+--------------+ SFA Prox   112                         biphasic                          +-----------+--------+-----+---------------+-------------------+--------------+ SFA Mid    95                          biphasic                          +-----------+--------+-----+---------------+-------------------+--------------+ SFA Distal 77                          biphasic                          +-----------+--------+-----+---------------+-------------------+--------------+ POP Prox   42                          biphasic                          +-----------+--------+-----+---------------+-------------------+--------------+ POP Mid    51                          biphasic                          +-----------+--------+-----+---------------+-------------------+--------------+ POP Distal 32                          biphasic                          +-----------+--------+-----+---------------+-------------------+--------------+ TP Trunk   32                          biphasic                           +-----------+--------+-----+---------------+-------------------+--------------+ ATA Prox   285          75-99% stenosisbiphasic                          +-----------+--------+-----+---------------+-------------------+--------------+ ATA Mid    91  monophasic                        +-----------+--------+-----+---------------+-------------------+--------------+ ATA Distal              occluded                                         +-----------+--------+-----+---------------+-------------------+--------------+ PTA Prox   24                          monophasic                        +-----------+--------+-----+---------------+-------------------+--------------+ PTA Mid    17                          dampened monophasic               +-----------+--------+-----+---------------+-------------------+--------------+ PTA Distal 31                          dampened monophasic               +-----------+--------+-----+---------------+-------------------+--------------+ PERO Prox                                                 not visualized +-----------+--------+-----+---------------+-------------------+--------------+ PERO Mid   52                          monophasic                        +-----------+--------+-----+---------------+-------------------+--------------+ PERO Distal17                          dampened monophasic               +-----------+--------+-----+---------------+-------------------+--------------+ DP         5                           dampened monophasic               +-----------+--------+-----+---------------+-------------------+--------------+  +-----------+--------+-----+--------+----------+-----------------------+ LEFT       PSV cm/sRatioStenosisWaveform  Comments                +-----------+--------+-----+--------+----------+-----------------------+ CFA Mid    118                  biphasic                           +-----------+--------+-----+--------+----------+-----------------------+ DFA        59                   biphasic                          +-----------+--------+-----+--------+----------+-----------------------+ SFA Prox   112                  biphasic                          +-----------+--------+-----+--------+----------+-----------------------+  SFA Mid    66                   biphasic                          +-----------+--------+-----+--------+----------+-----------------------+ SFA Distal 38                   biphasic                          +-----------+--------+-----+--------+----------+-----------------------+ POP Prox   50                   biphasic                          +-----------+--------+-----+--------+----------+-----------------------+ POP Distal 38                   biphasic                          +-----------+--------+-----+--------+----------+-----------------------+ TP Trunk   41                   biphasic                          +-----------+--------+-----+--------+----------+-----------------------+ ATA Prox   26                   biphasic                          +-----------+--------+-----+--------+----------+-----------------------+ ATA Mid                 occluded          short segment occlusion +-----------+--------+-----+--------+----------+-----------------------+ ATA Distal 24                   monophasicretrograde flow         +-----------+--------+-----+--------+----------+-----------------------+ PTA Prox   43                   biphasic                          +-----------+--------+-----+--------+----------+-----------------------+ PTA Mid    65                   biphasic                          +-----------+--------+-----+--------+----------+-----------------------+ PTA Distal 63                   biphasic                           +-----------+--------+-----+--------+----------+-----------------------+ PERO Prox                                 not visualized          +-----------+--------+-----+--------+----------+-----------------------+ PERO Mid   38                   biphasic                          +-----------+--------+-----+--------+----------+-----------------------+  PERO Distal43                   biphasic                          +-----------+--------+-----+--------+----------+-----------------------+ DP         17                   biphasic  retrograde              +-----------+--------+-----+--------+----------+-----------------------+  Summary: Right: 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 Distal anterior tibial artery appears occluded. Left: Short segment occlusion noted in the mid anterior tibial artery with retrograde flow seen distally.  See table(s) above for measurements and observations. Electronically signed by Sherald Hess MD on 09/10/2023 at 7:26:12 AM.    Final    CT ABDOMEN PELVIS W CONTRAST Result Date: 09/08/2023 CLINICAL DATA:  History of emphysematous cystitis and large left-sided perirectal abscess with recent incision and drainage, initial encounter EXAM: CT ABDOMEN AND PELVIS WITH CONTRAST TECHNIQUE: Multidetector CT imaging of the abdomen and pelvis was performed using the standard protocol following bolus administration of intravenous contrast. RADIATION DOSE REDUCTION: This exam was performed according to the departmental dose-optimization program which includes automated exposure control, adjustment of the mA and/or kV according to patient size and/or use of iterative reconstruction technique. CONTRAST:  80mL OMNIPAQUE IOHEXOL 300 MG/ML SOLN, 30mL OMNIPAQUE IOHEXOL 300 MG/ML SOLN COMPARISON:  None Available. FINDINGS: Lower chest: Lung bases are free of acute infiltrate or sizable effusion. Hepatobiliary: No focal liver abnormality is seen. No  gallstones, gallbladder wall thickening, or biliary dilatation. Pancreas: Unremarkable. No pancreatic ductal dilatation or surrounding inflammatory changes. Spleen: Normal in size without focal abnormality. Adrenals/Urinary Tract: Adrenal glands are within normal limits. Right kidney shows a normal enhancement pattern. Some slight delay in enhancement is noted on the left. Some mild fullness of the left collecting system is noted. No discrete calculus is identified. The overall appearance is slightly improved when compare with the prior exam related to interval decompression of the bladder. The right collecting system is within normal limits. Contrast material was instilled into the bladder for CT cystogram. No findings to suggest extravasation are identified. A small amount of air is noted within the bladder related to the recent instrumentation. 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 Stomach/Bowel: No obstructive or inflammatory changes of the colon are seen. The appendix is within normal limits. Small bowel and stomach are unremarkable. Vascular/Lymphatic: Aortic atherosclerosis. No enlarged abdominal or pelvic lymph nodes. Reproductive: Prostate is unremarkable. Other: No free air or free fluid is noted within the abdomen. Musculoskeletal: No acute bony abnormality is noted. There remains a perirectal fluid collection on the left extending from the operator musculature medially and posteriorly into the left buttock. The vast majority of the previously seen fluid has been drained consistent with the given clinical history. Small drainage catheter is noted within as well as some hyperdense material in the medial left buttock which may be related to the gauze packing. The largest area of fluid is noted adjacent to the operator musculature measuring 3.0 by 1.2 cm in greatest dimension. This is a significant improved  from the prior exam. IMPRESSION: Significant improvement in the degree of fluid in the left perirectal and left buttock region when compare with the prior exam. Small drainage catheter and gauze  packing is noted within. A mild residual fluid collection is noted as described. CT cystogram shows no evidence of extravasation. The findings of emphysematous cystitis have resolved in the interval. Some residual bladder wall thickening is noted. Mild fullness in the left renal collecting system although improved from the prior exam follow-up decompression of the bladder. Electronically Signed   By: Alcide Clever M.D.   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 RADIATION DOSE REDUCTION: This exam was performed according to the departmental dose-optimization program which includes automated exposure control, adjustment of the mA and/or kV according to patient size and/or use of iterative reconstruction technique. CONTRAST:  80mL OMNIPAQUE IOHEXOL 300 MG/ML SOLN, 30mL OMNIPAQUE IOHEXOL 300 MG/ML SOLN COMPARISON:  None Available. FINDINGS: Lower chest: Lung bases are free of acute infiltrate or sizable effusion. Hepatobiliary: No focal liver abnormality is seen. No gallstones, gallbladder wall thickening, or biliary dilatation. Pancreas: Unremarkable. No pancreatic ductal dilatation or surrounding inflammatory changes. Spleen: Normal in size without focal abnormality. Adrenals/Urinary Tract: Adrenal glands are within normal limits. Right kidney shows a normal enhancement pattern. Some slight delay in enhancement is noted on the left. Some mild fullness of the  left collecting system is noted. No discrete calculus is identified. The overall appearance is slightly improved when compare with the prior exam related to interval decompression of the bladder. The right collecting system is within normal limits. Contrast material was instilled into the bladder for CT cystogram. No findings to suggest extravasation are identified. A small amount of air is noted within the bladder related to the recent instrumentation. 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 Stomach/Bowel: No obstructive or inflammatory changes of the colon are seen. The appendix is within normal limits. Small bowel and stomach are unremarkable. Vascular/Lymphatic: Aortic atherosclerosis. No enlarged abdominal or pelvic lymph nodes. Reproductive: Prostate is unremarkable. Other: No free air or free fluid is noted within the abdomen. Musculoskeletal: No acute bony abnormality is noted. There remains a perirectal fluid collection on the left extending from the operator musculature medially and posteriorly into the left buttock. The vast majority of the previously seen fluid has been drained consistent with the given clinical history. Small drainage catheter is noted within as well as some hyperdense material in the medial left buttock which may be related to the gauze packing. The largest area of fluid is noted adjacent to the operator musculature measuring 3.0 by 1.2 cm in greatest dimension. This is a significant improved from the prior exam. IMPRESSION: Significant improvement in the degree of fluid in the left perirectal and left buttock region when compare with the prior exam. Small drainage catheter and gauze packing is noted within. A mild residual fluid collection is noted as described. CT cystogram shows no evidence of extravasation. The findings of emphysematous cystitis have resolved in the interval. Some  residual bladder wall thickening is noted. Mild fullness in the left renal collecting system although improved from the prior exam follow-up decompression of the bladder. Electronically Signed   By: Alcide Clever M.D.   On: 09/08/2023 20:58    Scheduled Meds:  ALPRAZolam  0.5 mg Oral QHS  amiodarone  200 mg Oral Daily   Chlorhexidine Gluconate Cloth  6 each Topical Daily   doxycycline  100 mg Oral Q12H   enoxaparin (LOVENOX) injection  60 mg Subcutaneous Q12H   insulin aspart  0-15 Units Subcutaneous TID WC   insulin aspart  0-5 Units Subcutaneous QHS   insulin glargine  30 Units Subcutaneous QHS   leptospermum manuka honey  1 Application Topical Daily   metoprolol succinate  25 mg Oral Daily   pantoprazole  40 mg Oral QHS   rosuvastatin  40 mg Oral Daily   sacubitril-valsartan  1 tablet Oral BID   tamsulosin  0.4 mg Oral Daily   Continuous Infusions:  ampicillin-sulbactam (UNASYN) IV 3 g (09/10/23 1024)     LOS: 4 days   Time spent: 40 minutes  Carollee Herter, DO  [facility]  09/10/2023, 2:12 PM

## 2023-09-10 NOTE — Assessment & Plan Note (Signed)
 09-10-2023 stable. On Toprol-XL 25 mg daily.

## 2023-09-10 NOTE — Subjective & Objective (Addendum)
 Pt seen and examined. Pt's wife also admitted to hospital.  Surgery ok with pt's wound. Pt does not need surgical debridement of chronic wounds. Surgery says pt can go home with abx.  Vascular surgery wanted podiatry to see pt's foot wounds. Pt is not an established podiatry patient so he will need to go to unassigned orthopedics to see. Dr. Lajoyce Corners will see later today.  ID wants orthopedics to weigh in before giving final abx recs.

## 2023-09-10 NOTE — Progress Notes (Signed)
 ABI has been completed.   Results can be found under chart review under CV PROC. 09/10/2023 11:22 AM Domani Bakos RVT, RDMS

## 2023-09-10 NOTE — Assessment & Plan Note (Signed)
 09-10-2023 stable. On Toprol-xl and crestor.

## 2023-09-10 NOTE — Assessment & Plan Note (Addendum)
 09-10-2023 BP stable. On Toprol-XL. Unclear if he has been taking any of his CHF GDMT meds.  Will start low dose entresto 24-26 bid. Monitor SCr

## 2023-09-10 NOTE — Assessment & Plan Note (Signed)
 09-10-2023 chronic

## 2023-09-10 NOTE — Assessment & Plan Note (Signed)
 Prior to 09-10-2023: -Imaging at [facility] had shown large loculated collection in the left buttocks and perirectal tissues, underwent I&D there, catheter was placed into the abscess cavity to help facilitate drainage. -CT abdomen 3/3 showed significant improvement in the left perirectal and left buttock region compared to prior exam.  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 -Seen by surgery today, Dr. Dwain Sarna, recommended discharge on 10 days of antibiotics and follow-up in [facility] with surgeon who drained it -Currently on IV Zosyn and linezolid.  -Blood cultures at [facility] positive for Klebsiella pneumonia, urine culture here on 3/1 positive for Klebsiella - requested ID consult regarding antibiotics and duration  09-10-2023 ID changed ABX to [email or web address]. Surgery does not think pt needs any additional debridement and can f/u with [facility] surgeon.

## 2023-09-10 NOTE — Assessment & Plan Note (Signed)
 09-10-2023 on amiodarone. Unclear if he has been taking Eliquis. Holding systemic AC for now until it is determined he does not need any more wound debridement on his feet.

## 2023-09-10 NOTE — Hospital Course (Addendum)
 64yo with h/o DM, chronic HRrEF, HTN, pulmonary HTN, afib, recent B toe amputation, and urinary retention with self-catheterization who presented on 3/1 with rectal pain and purulent discharge.  He was found to have a rectal abscess with Klebsiella pneumoniae bacteremia (also UTI) s/p I&D (OSH); surgery has consulted and he has completed antibiotic therapy.  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  He is medically stable and awaiting SNF placement.

## 2023-09-10 NOTE — Assessment & Plan Note (Signed)
 09-10-2023 on lantus and SSI. A1c of 9% last month at [facility]. Indicating poor outpatient control.
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
## 2023-09-11 ENCOUNTER — Encounter (HOSPITAL_BASED_OUTPATIENT_CLINIC_OR_DEPARTMENT_OTHER): Payer: Medicaid Other | Admitting: Internal Medicine

## 2023-09-11 DIAGNOSIS — N3080 Other cystitis without hematuria: Secondary | ICD-10-CM | POA: Diagnosis not present

## 2023-09-11 DIAGNOSIS — K611 Rectal abscess: Secondary | ICD-10-CM | POA: Diagnosis not present

## 2023-09-11 DIAGNOSIS — B961 Klebsiella pneumoniae [K. pneumoniae] as the cause of diseases classified elsewhere: Secondary | ICD-10-CM | POA: Diagnosis not present

## 2023-09-11 DIAGNOSIS — A4159 Other Gram-negative sepsis: Secondary | ICD-10-CM | POA: Diagnosis not present

## 2023-09-11 LAB — GLUCOSE, CAPILLARY
Glucose-Capillary: 137 mg/dL — ABNORMAL HIGH (ref 70–99)
Glucose-Capillary: 164 mg/dL — ABNORMAL HIGH (ref 70–99)
Glucose-Capillary: 122 mg/dL — ABNORMAL HIGH (ref 70–99)
Glucose-Capillary: 171 mg/dL — ABNORMAL HIGH (ref 70–99)

## 2023-09-11 LAB — CULTURE, BLOOD (ROUTINE X 2)
Special Requests: ADEQUATE
Special Requests: ADEQUATE

## 2023-09-11 LAB — CBC
HCT: 30.9 % — ABNORMAL LOW (ref 39.0–52.0)
Hemoglobin: 10 g/dL — ABNORMAL LOW (ref 13.0–17.0)
MCH: 28.5 pg (ref 26.0–34.0)
MCHC: 32.4 g/dL (ref 30.0–36.0)
MCV: 88 fL (ref 80.0–100.0)
Platelets: 416 10*3/uL — ABNORMAL HIGH (ref 150–400)
RBC: 3.51 MIL/uL — ABNORMAL LOW (ref 4.22–5.81)
RDW: 15.7 % — ABNORMAL HIGH (ref 11.5–15.5)
WBC: 9.9 10*3/uL (ref 4.0–10.5)
nRBC: 0 % (ref 0.0–0.2)

## 2023-09-11 LAB — RENAL FUNCTION PANEL
Albumin: 2.1 g/dL — ABNORMAL LOW (ref 3.5–5.0)
Anion gap: 5 (ref 5–15)
BUN: 12 mg/dL (ref 8–23)
CO2: 23 mmol/L (ref 22–32)
Calcium: 7.4 mg/dL — ABNORMAL LOW (ref 8.9–10.3)
Chloride: 108 mmol/L (ref 98–111)
Creatinine, Ser: 0.89 mg/dL (ref 0.61–1.24)
GFR, Estimated: >60 mL/min (ref >60)
Glucose, Bld: 166 mg/dL — ABNORMAL HIGH (ref 70–99)
Phosphorus: 2.8 mg/dL (ref 2.5–4.6)
Potassium: 3.4 mmol/L — ABNORMAL LOW (ref 3.5–5.1)
Sodium: 136 mmol/L (ref 135–145)

## 2023-09-11 LAB — MRSA NEXT GEN BY PCR, NASAL: MRSA by PCR Next Gen: DETECTED — AB

## 2023-09-11 MED ORDER — DOCUSATE SODIUM 100 MG PO CAPS
100.0000 mg | ORAL_CAPSULE | Freq: Two times a day (BID) | ORAL | Status: DC
  Administered 2023-09-11 – 2023-09-23 (×24): 100 mg via ORAL
  Filled 2023-09-11 (×24): qty 1

## 2023-09-11 MED ORDER — MUPIROCIN 2 % EX OINT
1.0000 | TOPICAL_OINTMENT | Freq: Two times a day (BID) | CUTANEOUS | Status: DC
  Filled 2023-09-11: qty 22

## 2023-09-11 MED ORDER — CHLORHEXIDINE GLUCONATE CLOTH 2 % EX PADS
6.0000 | MEDICATED_PAD | Freq: Every day | CUTANEOUS | Status: DC
  Administered 2023-09-11: 6 via TOPICAL

## 2023-09-11 MED ORDER — POLYETHYLENE GLYCOL 3350 17 G PO PACK
17.0000 g | PACK | Freq: Every day | ORAL | Status: DC
  Administered 2023-09-11 – 2023-09-14 (×3): 17 g via ORAL
  Filled 2023-09-11 (×4): qty 1

## 2023-09-11 MED ORDER — POTASSIUM CHLORIDE CRYS ER 20 MEQ PO TBCR
40.0000 meq | EXTENDED_RELEASE_TABLET | Freq: Once | ORAL | Status: AC
  Administered 2023-09-11: 40 meq via ORAL
  Filled 2023-09-11: qty 2

## 2023-09-11 NOTE — Progress Notes (Signed)
 PT Cancellation Note  Patient Details Name: ARISTON GRANDISON MRN: [id number] DOB: January 06, 1961   Cancelled Treatment:    Reason Eval/Treat Not Completed: Medical issues which prohibited therapy (Pt scheduled for further amputations on 3/8. Per Dr.Duda, holding PT until after surgery. Acute PT to follow.)  Hilton Cork, PT, DPT Secure Chat Preferred  Rehab Office [phone]  Arturo Morton Brion Aliment 09/11/2023, 10:02 AM

## 2023-09-11 NOTE — Progress Notes (Signed)
 [facility]                                                                              Henry Bryant, is a 63 y.o. male, DOB - [birth year], EXB:[id number] Admit date - 09/06/2023    Outpatient Primary MD for the patient is Hasanaj, Myra Gianotti, MD  LOS - 5  days  No chief complaint on file.      Brief summary   63 y.o. male with medical history significant of type 2 diabetes mellitus, chronic systolic CHF, hypertension, pulmonary hypertension, A-fib/flutter, recent bilateral toe amputation by podiatry about 4 weeks ago, urinary retention and self-catheterization who comes into the hospital with rectal pain and a purulent discharge in that area.  He tells me he had a fall several days ago and fell on his buttocks, was feeling fine up until 1 to 2 days ago when he started noticing increased pain and just now his seeing discharge. He presented to [facility] emergency room.  Imaging there showed a large, loculated, rim-enhancing fluid collection within the left gluteus and left perirectal tissues measuring 9 x 6 x 7 0.2 x 11 cm in size, also showed emphysematous cystitis. Urinalysis concerning for UTI. He underwent an I&D in the ED, was given antibiotics. He also reports bilateral great toe osteomyelitis status post surgery few weeks back.    Assessment & Plan    Principal Problem: Sepsis due to rectal abscess Klebsiella pneumonia bacteremia -Imaging at [facility] had shown large loculated collection in the left buttocks and perirectal tissues, underwent I&D there, catheter was placed into the abscess cavity to help facilitate drainage. -CT abdomen 3/3 showed significant improvement in the left perirectal and left buttock region compared to prior exam.  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 -Seen by surgery, Dr. Dwain Sarna, recommended discharge on 10 days of antibiotics and follow-up in [facility] with [facility] surgeon -Continue IV Unasyn, ID following   -Blood cultures at [facility] positive for Klebsiella pneumonia, urine culture here on 3/1 positive for Klebsiella -Pain control increased per patient request, added bowel regimen  Emphysematous cystitis, Klebsiella UTI -Seen by urology on 3/3, Dr. Lafonda Mosses, recommended continuing Foley catheter -Repeat CT abdomen showed improved emphysematous cystitis -Continue IV Unasyn   Diabetic ulcer of toes, osteomyelitis bilateral first metatarsals -Prior toe amputation at [facility] in 08/2023.   -MRI of the toes showed prior amputation of the great toe with osteomyelitis of the distal half of the first metatarsal on the left and right, no abscess.  - Vascular surgery requested orthopedic opinion.   -Seen by Dr. Lajoyce Corners, recommended first ray and second toe amputation bilaterally dressing changes to both knees -Surgery timing per orthopedics, Dr. Lajoyce Corners     Peripheral arterial disease with history of revascularization -Followed by vascular surgery outpatient, last saw Dr. Hetty Blend, underwent lower extremity angiogram with revascularization in 06/2023  -Given poor healing and findings of osteomyelitis, vascular surgery recommended orthopedics consult   Insulin-dependent diabetes mellitus, uncontrolled with with hyperglycemia- -hemoglobin A1c 6.8 on 3/1  CBG (last 3)  Recent Labs    09/10/23 2110 09/11/23 0613 09/11/23 1210  GLUCAP 200* 137* 164*   -  Continue Lantus 30 units at bedtime, SSI moderate     Chronic systolic CHF -euvolemic,  -Continue Toprol-XL, low-dose Entresto, Crestor    Atrial flutter -Continue amiodarone, metoprolol -Eliquis currently on hold in anticipation of surgical intervention  Hypokalemia -K3.4, replaced  Estimated body mass index is 20.18 kg/m as calculated from the following:   Height as of this encounter: 5\' 10"  (1.778 m).   Weight as of this encounter: 63.8 kg.  Code Status: DNR DVT Prophylaxis:     Level of Care: Level of care: Telemetry Medical Family Communication: Updated  patient Disposition Plan:      Remains inpatient appropriate:      Procedures:    Consultants:   General surgery Urology Vascular surgery Infectious ease  Antimicrobials:   Anti-infectives (From admission, onward)    Start     Dose/Rate Route Frequency Ordered Stop   09/09/23 1300  Ampicillin-Sulbactam (UNASYN) 3 g in sodium chloride 0.9 % 100 mL IVPB        3 g 200 mL/hr over 30 Minutes Intravenous Every 6 hours 09/09/23 1204     09/09/23 1300  doxycycline (VIBRA-TABS) tablet 100 mg        100 mg Oral Every 12 hours 09/09/23 1204     09/08/23 2000  linezolid (ZYVOX) IVPB 600 mg  Status:  Discontinued        600 mg 300 mL/hr over 60 Minutes Intravenous Every 12 hours 09/08/23 1117 09/09/23 1204   09/07/23 0800  vancomycin (VANCOREADY) IVPB 750 mg/150 mL  Status:  Discontinued        750 mg 150 mL/hr over 60 Minutes Intravenous Every 12 hours 09/06/23 1834 09/08/23 1117   09/06/23 1930  vancomycin (VANCOCIN) IVPB 1000 mg/200 mL premix        1,000 mg 200 mL/hr over 60 Minutes Intravenous STAT 09/06/23 1834 09/06/23 2300   09/06/23 1930  piperacillin-tazobactam (ZOSYN) IVPB 3.375 g  Status:  Discontinued        3.375 g 12.5 mL/hr over 240 Minutes Intravenous Every 8 hours 09/06/23 1834 09/09/23 1204          Medications  ALPRAZolam  0.5 mg Oral QHS   amiodarone  200 mg Oral Daily   Chlorhexidine Gluconate Cloth  6 each Topical Daily   Chlorhexidine Gluconate Cloth  6 each Topical Daily   docusate sodium  100 mg Oral BID   doxycycline  100 mg Oral Q12H   enoxaparin (LOVENOX) injection  60 mg Subcutaneous Q12H   insulin aspart  0-15 Units Subcutaneous TID WC   insulin aspart  0-5 Units Subcutaneous QHS   insulin glargine  30 Units Subcutaneous QHS   leptospermum manuka honey  1 Application Topical Daily   metoprolol succinate  25 mg Oral Daily   mupirocin ointment  1 Application Nasal BID   pantoprazole  40 mg Oral QHS   polyethylene glycol  17 g Oral Daily    rosuvastatin  40 mg Oral Daily   sacubitril-valsartan  1 tablet Oral BID   tamsulosin  0.4 mg Oral Daily      Subjective:   Henry Bryant was seen and examined today.  Complaining of rectal pain not well-controlled with current pain medications, constipation+ no nausea vomiting, chest pain, shortness of breath, fevers.   Objective:   Vitals:   09/10/23 2328 09/11/23 0339 09/11/23 0816 09/11/23 1150  BP: (!) 119/56 127/67 (!) 113/51 (!) 102/58  Pulse: 62 64 69 63  Resp: 14 18 16  18  Temp: 97.8 F (36.6 C) 97.7 F (36.5 C) 97.6 F (36.4 C) 97.7 F (36.5 C)  TempSrc: Oral Oral Oral Oral  SpO2: 94% 94% 97% 97%  Weight:      Height:        Intake/Output Summary (Last 24 hours) at 09/11/2023 1224 Last data filed at 09/11/2023 0857 Gross per 24 hour  Intake 590 ml  Output 1600 ml  Net -1010 ml     Wt Readings from Last 3 Encounters:  09/06/23 63.8 kg  07/07/23 72.6 kg  05/09/23 78 kg    Physical Exam General: Alert and oriented x 3, NAD, ill-appearing Cardiovascular: S1 S2 clear, RRR.  Respiratory: CTAB, no wheezing Gastrointestinal: Soft, nontender, nondistended, NBS Ext: no pedal edema bilaterally Neuro: no new deficits Skin: Large dressing over his buttocks/sacrum with drain +, b/l toes dressing intact GU: Foley+ Psych: Normal affect    Data Reviewed:  I have personally reviewed following labs    CBC Lab Results  Component Value Date   WBC 9.9 09/11/2023   RBC 3.51 (L) 09/11/2023   HGB 10.0 (L) 09/11/2023   HCT 30.9 (L) 09/11/2023   MCV 88.0 09/11/2023   MCH 28.5 09/11/2023   PLT 416 (H) 09/11/2023   MCHC 32.4 09/11/2023   RDW 15.7 (H) 09/11/2023   LYMPHSABS 1.2 09/06/2023   MONOABS 0.8 09/06/2023   EOSABS 0.0 09/06/2023   BASOSABS 0.0 09/06/2023     Last metabolic panel Lab Results  Component Value Date   NA 136 09/11/2023   K 3.4 (L) 09/11/2023   CL 108 09/11/2023   CO2 23 09/11/2023   BUN 12 09/11/2023   CREATININE 0.89 09/11/2023    GLUCOSE 166 (H) 09/11/2023   GFRNONAA >60 09/11/2023   CALCIUM 7.4 (L) 09/11/2023   PHOS 2.8 09/11/2023   PROT 5.6 (L) 09/09/2023   ALBUMIN 2.1 (L) 09/11/2023   BILITOT 0.4 09/09/2023   ALKPHOS 65 09/09/2023   AST 13 (L) 09/09/2023   ALT 17 09/09/2023   ANIONGAP 5 09/11/2023    CBG (last 3)  Recent Labs    09/10/23 2110 09/11/23 0613 09/11/23 1210  GLUCAP 200* 137* 164*      Coagulation Profile: No results for input(s): "INR", "PROTIME" in the last 168 hours.   Radiology Studies: I have personally reviewed the imaging studies  VAS [place] ABI WITH/WO TBI Result Date: 09/10/2023  LOWER EXTREMITY DOPPLER STUDY Patient Name:  Henry Bryant  Date of Exam:   09/10/2023 Medical Rec #: [id number]         Accession #:    [id number] Date of Birth: [birth year]          Patient Gender: M Patient Age:   78 years Exam Location:  [facility] Procedure:      VAS [place] ABI WITH/WO TBI Referring Phys: Emilie Rutter --------------------------------------------------------------------------------  Indications: Peripheral artery disease. BLE great toe amputations recently, non              healing surgical site. High Risk Factors: Hypertension, hyperlipidemia, coronary artery disease. Other Factors: CHF,.  Vascular               RLE ateriogram w/ TPT & PT balloon angioplasty Interventions:         07/07/2023. Comparison Study: 06/26/2023 @[facility]  Healthcare Performing Technologist: Ernestene Mention RVT, RDMS  Examination Guidelines: A complete evaluation includes at minimum, Doppler waveform signals and systolic blood pressure reading at the level of bilateral brachial, anterior tibial, and posterior tibial  arteries, when vessel segments are accessible. Bilateral testing is considered an integral part of a complete examination. Photoelectric Plethysmograph (PPG) waveforms and toe systolic pressure readings are included as required and additional duplex testing as needed. Limited examinations for reoccurring indications  may be performed as noted.  ABI Findings: +---------+------------------+-----+----------+--------+ Right    Rt Pressure (mmHg)IndexWaveform  Comment  +---------+------------------+-----+----------+--------+ Brachial 130                    triphasic          +---------+------------------+-----+----------+--------+ PTA      87                0.63 monophasic         +---------+------------------+-----+----------+--------+ DP       0                 0.00 absent             +---------+------------------+-----+----------+--------+ Great Toe                                 amp.     +---------+------------------+-----+----------+--------+ +--------+------------------+-----+-----------------+-------+ Left    Lt Pressure (mmHg)IndexWaveform         Comment +--------+------------------+-----+-----------------+-------+ [id number]                    triphasic                +--------+------------------+-----+-----------------+-------+ PTA     139               1.00 audibly triphasic        +--------+------------------+-----+-----------------+-------+ DP      131               0.94 monophasic             +--------+------------------+-----+-----------------+-------+ +-------+-----------+-----------+------------+------------+ ABI/TBIToday's ABIToday's TBIPrevious ABIPrevious TBI +-------+-----------+-----------+------------+------------+ Right  0.63       amputation 0.54        0.34         +-------+-----------+-----------+------------+------------+ Left   1.00       amputation 0.77        0.27         +-------+-----------+-----------+------------+------------+   difficulty obtaining waveform due to patient position and bandages.  Summary: Right: Resting right ankle-brachial index indicates moderate right lower extremity arterial disease. Left: Resting left ankle-brachial index is within normal range. *See table(s) above for measurements and observations.   Electronically signed by Sherald Hess MD on 09/10/2023 at 12:52:12 PM.    Final    VAS [place] LOWER EXTREMITY ARTERIAL DUPLEX Result Date: 09/10/2023 LOWER EXTREMITY ARTERIAL DUPLEX STUDY Patient Name:  Henry Bryant  Date of Exam:   09/09/2023 Medical Rec #: [id number]         Accession #:    [id number] Date of Birth: 01-23-1961          Patient Gender: M Patient Age:   75 years Exam Location:  [facility] Procedure:      VAS [place] LOWER EXTREMITY ARTERIAL DUPLEX Referring Phys: Emilie Rutter --------------------------------------------------------------------------------  Indications: Peripheral artery disease. High Risk Factors: Hypertension, hyperlipidemia, Diabetes, past history of                    smoking, coronary artery disease. Other Factors: CHF,.  Vascular               RLE  ateriogram w/ TPT & PT balloon angioplasty Interventions:         07/07/2023. Current ABI:           n/a Limitations: Difficult exam due to habitus Comparison Study: No previous arterial duplex exams Performing Technologist: Jody Hill RVT, RDMS  Examination Guidelines: A complete evaluation includes B-mode imaging, spectral Doppler, color Doppler, and power Doppler as needed of all accessible portions of each vessel. Bilateral testing is considered an integral part of a complete examination. Limited examinations for reoccurring indications may be performed as noted.  +-----------+--------+-----+---------------+-------------------+--------------+ RIGHT      PSV cm/sRatioStenosis       Waveform           Comments       +-----------+--------+-----+---------------+-------------------+--------------+ CFA Mid    118                         biphasic                          +-----------+--------+-----+---------------+-------------------+--------------+ DFA        95                          biphasic                          +-----------+--------+-----+---------------+-------------------+--------------+ SFA Prox    112                         biphasic                          +-----------+--------+-----+---------------+-------------------+--------------+ SFA Mid    95                          biphasic                          +-----------+--------+-----+---------------+-------------------+--------------+ SFA Distal 77                          biphasic                          +-----------+--------+-----+---------------+-------------------+--------------+ POP Prox   42                          biphasic                          +-----------+--------+-----+---------------+-------------------+--------------+ POP Mid    51                          biphasic                          +-----------+--------+-----+---------------+-------------------+--------------+ POP Distal 32                          biphasic                          +-----------+--------+-----+---------------+-------------------+--------------+ TP Trunk   32  biphasic                          +-----------+--------+-----+---------------+-------------------+--------------+ ATA Prox   285          75-99% stenosisbiphasic                          +-----------+--------+-----+---------------+-------------------+--------------+ ATA Mid    91                          monophasic                        +-----------+--------+-----+---------------+-------------------+--------------+ ATA Distal              occluded                                         +-----------+--------+-----+---------------+-------------------+--------------+ PTA Prox   24                          monophasic                        +-----------+--------+-----+---------------+-------------------+--------------+ PTA Mid    17                          dampened monophasic               +-----------+--------+-----+---------------+-------------------+--------------+ PTA Distal 31                           dampened monophasic               +-----------+--------+-----+---------------+-------------------+--------------+ PERO Prox                                                 not visualized +-----------+--------+-----+---------------+-------------------+--------------+ PERO Mid   52                          monophasic                        +-----------+--------+-----+---------------+-------------------+--------------+ PERO Distal17                          dampened monophasic               +-----------+--------+-----+---------------+-------------------+--------------+ DP         5                           dampened monophasic               +-----------+--------+-----+---------------+-------------------+--------------+  +-----------+--------+-----+--------+----------+-----------------------+ LEFT       PSV cm/sRatioStenosisWaveform  Comments                +-----------+--------+-----+--------+----------+-----------------------+ CFA Mid    118                  biphasic                          +-----------+--------+-----+--------+----------+-----------------------+  DFA        59                   biphasic                          +-----------+--------+-----+--------+----------+-----------------------+ SFA Prox   112                  biphasic                          +-----------+--------+-----+--------+----------+-----------------------+ SFA Mid    66                   biphasic                          +-----------+--------+-----+--------+----------+-----------------------+ SFA Distal 38                   biphasic                          +-----------+--------+-----+--------+----------+-----------------------+ POP Prox   50                   biphasic                          +-----------+--------+-----+--------+----------+-----------------------+ POP Distal 38                   biphasic                           +-----------+--------+-----+--------+----------+-----------------------+ TP Trunk   41                   biphasic                          +-----------+--------+-----+--------+----------+-----------------------+ ATA Prox   26                   biphasic                          +-----------+--------+-----+--------+----------+-----------------------+ ATA Mid                 occluded          short segment occlusion +-----------+--------+-----+--------+----------+-----------------------+ ATA Distal 24                   monophasicretrograde flow         +-----------+--------+-----+--------+----------+-----------------------+ PTA Prox   43                   biphasic                          +-----------+--------+-----+--------+----------+-----------------------+ PTA Mid    65                   biphasic                          +-----------+--------+-----+--------+----------+-----------------------+ PTA Distal 63                   biphasic                          +-----------+--------+-----+--------+----------+-----------------------+  PERO Prox                                 not visualized          +-----------+--------+-----+--------+----------+-----------------------+ PERO Mid   38                   biphasic                          +-----------+--------+-----+--------+----------+-----------------------+ PERO Distal43                   biphasic                          +-----------+--------+-----+--------+----------+-----------------------+ DP         17                   biphasic  retrograde              +-----------+--------+-----+--------+----------+-----------------------+  Summary: Right: 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 Distal anterior tibial artery appears occluded. Left: Short segment occlusion noted in the mid anterior tibial artery with retrograde flow seen distally.  See table(s) above for measurements  and observations. Electronically signed by Sherald Hess MD on 09/10/2023 at 7:26:12 AM.    Final        Thad Ranger M.D. [facility] 09/11/2023, 12:24 PM  Available via Epic secure chat 7am-7pm After 7 pm, please refer to night coverage provider listed on amion.

## 2023-09-12 ENCOUNTER — Encounter (HOSPITAL_COMMUNITY): Payer: Self-pay | Admitting: Internal Medicine

## 2023-09-12 ENCOUNTER — Inpatient Hospital Stay (HOSPITAL_COMMUNITY): Admitting: Certified Registered Nurse Anesthetist

## 2023-09-12 ENCOUNTER — Encounter (HOSPITAL_COMMUNITY)
Admission: EM | Disposition: A | Discharge status: Skilled Nursing Facility | Payer: Self-pay | Source: Ambulatory Visit | Attending: Internal Medicine

## 2023-09-12 DIAGNOSIS — M869 Osteomyelitis, unspecified: Secondary | ICD-10-CM

## 2023-09-12 DIAGNOSIS — Z87891 Personal history of nicotine dependence: Secondary | ICD-10-CM | POA: Diagnosis not present

## 2023-09-12 DIAGNOSIS — E1169 Type 2 diabetes mellitus with other specified complication: Secondary | ICD-10-CM

## 2023-09-12 DIAGNOSIS — M86672 Other chronic osteomyelitis, left ankle and foot: Secondary | ICD-10-CM | POA: Diagnosis not present

## 2023-09-12 DIAGNOSIS — M86671 Other chronic osteomyelitis, right ankle and foot: Secondary | ICD-10-CM

## 2023-09-12 DIAGNOSIS — I2510 Atherosclerotic heart disease of native coronary artery without angina pectoris: Secondary | ICD-10-CM | POA: Diagnosis not present

## 2023-09-12 DIAGNOSIS — K611 Rectal abscess: Secondary | ICD-10-CM | POA: Diagnosis not present

## 2023-09-12 HISTORY — PX: AMPUTATION: SHX166

## 2023-09-12 HISTORY — PX: AMPUTATION TOE: SHX6595

## 2023-09-12 LAB — RENAL FUNCTION PANEL
Albumin: 2.2 g/dL — ABNORMAL LOW (ref 3.5–5.0)
Anion gap: 6 (ref 5–15)
BUN: 11 mg/dL (ref 8–23)
CO2: 24 mmol/L (ref 22–32)
Calcium: 7.9 mg/dL — ABNORMAL LOW (ref 8.9–10.3)
Chloride: 108 mmol/L (ref 98–111)
Creatinine, Ser: 0.78 mg/dL (ref 0.61–1.24)
GFR, Estimated: >60 mL/min (ref >60)
Glucose, Bld: 123 mg/dL — ABNORMAL HIGH (ref 70–99)
Phosphorus: 2.8 mg/dL (ref 2.5–4.6)
Potassium: 3.5 mmol/L (ref 3.5–5.1)
Sodium: 138 mmol/L (ref 135–145)

## 2023-09-12 LAB — CBC
HCT: 32.8 % — ABNORMAL LOW (ref 39.0–52.0)
Hemoglobin: 10.5 g/dL — ABNORMAL LOW (ref 13.0–17.0)
MCH: 28.3 pg (ref 26.0–34.0)
MCHC: 32 g/dL (ref 30.0–36.0)
MCV: 88.4 fL (ref 80.0–100.0)
Platelets: 443 10*3/uL — ABNORMAL HIGH (ref 150–400)
RBC: 3.71 MIL/uL — ABNORMAL LOW (ref 4.22–5.81)
RDW: 15.9 % — ABNORMAL HIGH (ref 11.5–15.5)
WBC: 8.5 10*3/uL (ref 4.0–10.5)
nRBC: 0 % (ref 0.0–0.2)

## 2023-09-12 LAB — GLUCOSE, CAPILLARY
Glucose-Capillary: 126 mg/dL — ABNORMAL HIGH (ref 70–99)
Glucose-Capillary: 128 mg/dL — ABNORMAL HIGH (ref 70–99)
Glucose-Capillary: 195 mg/dL — ABNORMAL HIGH (ref 70–99)
Glucose-Capillary: 193 mg/dL — ABNORMAL HIGH (ref 70–99)
Glucose-Capillary: 145 mg/dL — ABNORMAL HIGH (ref 70–99)

## 2023-09-12 SURGERY — AMPUTATION, FOOT, RAY
Anesthesia: Monitor Anesthesia Care | Site: Toe | Laterality: Bilateral

## 2023-09-12 MED ORDER — VANCOMYCIN HCL IN DEXTROSE 1-5 GM/200ML-% IV SOLN: 1000.0000 mg | INTRAVENOUS | Status: AC

## 2023-09-12 MED ORDER — 0.9 % SODIUM CHLORIDE (POUR BTL) OPTIME
TOPICAL | Status: DC | PRN
  Administered 2023-09-12: 1000 mL

## 2023-09-12 MED ORDER — VITAMIN C 500 MG PO TABS
1000.0000 mg | ORAL_TABLET | Freq: Every day | ORAL | Status: DC
  Administered 2023-09-12 – 2023-09-23 (×12): 1000 mg via ORAL
  Filled 2023-09-12 (×12): qty 2

## 2023-09-12 MED ORDER — VANCOMYCIN HCL IN DEXTROSE 1-5 GM/200ML-% IV SOLN
INTRAVENOUS | Status: AC
  Administered 2023-09-12: 1000 mg via INTRAVENOUS
  Filled 2023-09-12: qty 200

## 2023-09-12 MED ORDER — PROPOFOL 10 MG/ML IV BOLUS
INTRAVENOUS | Status: AC
Start: 2023-09-12 — End: ?
  Filled 2023-09-12: qty 20

## 2023-09-12 MED ORDER — ACETAMINOPHEN 500 MG PO TABS: 1000.0000 mg | ORAL_TABLET | Freq: Once | ORAL | Status: AC

## 2023-09-12 MED ORDER — AMOXICILLIN-POT CLAVULANATE 875-125 MG PO TABS
1.0000 | ORAL_TABLET | Freq: Two times a day (BID) | ORAL | Status: AC
  Administered 2023-09-13 – 2023-09-15 (×6): 1 via ORAL
  Filled 2023-09-12 (×6): qty 1

## 2023-09-12 MED ORDER — PANTOPRAZOLE SODIUM 40 MG PO TBEC
40.0000 mg | DELAYED_RELEASE_TABLET | Freq: Every day | ORAL | Status: DC
  Administered 2023-09-12 – 2023-09-19 (×8): 40 mg via ORAL
  Filled 2023-09-12 (×8): qty 1

## 2023-09-12 MED ORDER — JUVEN PO PACK
1.0000 | PACK | Freq: Two times a day (BID) | ORAL | Status: DC
  Administered 2023-09-12 – 2023-09-23 (×7): 1 via ORAL
  Filled 2023-09-12 (×10): qty 1

## 2023-09-12 MED ORDER — CHLORHEXIDINE GLUCONATE 0.12 % MT SOLN: 15.0000 mL | Freq: Once | OROMUCOSAL | Status: AC

## 2023-09-12 MED ORDER — FENTANYL CITRATE (PF) 250 MCG/5ML IJ SOLN
INTRAMUSCULAR | Status: DC | PRN
  Administered 2023-09-12: 25 ug via INTRAVENOUS
  Administered 2023-09-12: 50 ug via INTRAVENOUS
  Administered 2023-09-12: 25 ug via INTRAVENOUS

## 2023-09-12 MED ORDER — CHLORHEXIDINE GLUCONATE 0.12 % MT SOLN
OROMUCOSAL | Status: AC
  Administered 2023-09-12: 15 mL via OROMUCOSAL
  Filled 2023-09-12: qty 15

## 2023-09-12 MED ORDER — EPHEDRINE SULFATE-NACL 50-0.9 MG/10ML-% IV SOSY
PREFILLED_SYRINGE | INTRAVENOUS | Status: DC | PRN
  Administered 2023-09-12 (×3): 5 mg via INTRAVENOUS

## 2023-09-12 MED ORDER — EPHEDRINE 5 MG/ML INJ
INTRAVENOUS | Status: AC
  Filled 2023-09-12: qty 5

## 2023-09-12 MED ORDER — PROPOFOL 500 MG/50ML IV EMUL
INTRAVENOUS | Status: DC | PRN
  Administered 2023-09-12: 75 ug/kg/min via INTRAVENOUS

## 2023-09-12 MED ORDER — POTASSIUM CHLORIDE 20 MEQ PO PACK
40.0000 meq | PACK | Freq: Once | ORAL | Status: AC
  Administered 2023-09-12: 40 meq via ORAL
  Filled 2023-09-12: qty 2

## 2023-09-12 MED ORDER — VASHE WOUND IRRIGATION OPTIME
TOPICAL | Status: DC | PRN
  Administered 2023-09-12: 34 [oz_av]

## 2023-09-12 MED ORDER — PHENYLEPHRINE 80 MCG/ML (10ML) SYRINGE FOR IV PUSH (FOR BLOOD PRESSURE SUPPORT)
PREFILLED_SYRINGE | INTRAVENOUS | Status: DC | PRN
  Administered 2023-09-12: 80 ug via INTRAVENOUS
  Administered 2023-09-12: 160 ug via INTRAVENOUS
  Administered 2023-09-12: 80 ug via INTRAVENOUS

## 2023-09-12 MED ORDER — LIDOCAINE HCL (PF) 1 % IJ SOLN
INTRAMUSCULAR | Status: AC
  Filled 2023-09-12: qty 30

## 2023-09-12 MED ORDER — ACETAMINOPHEN 500 MG PO TABS
ORAL_TABLET | ORAL | Status: AC
Start: 2023-09-12 — End: 2023-09-12
  Administered 2023-09-12: 1000 mg via ORAL
  Filled 2023-09-12: qty 2

## 2023-09-12 MED ORDER — SODIUM CHLORIDE 0.9 % IV SOLN: INTRAVENOUS | Status: DC

## 2023-09-12 MED ORDER — MUPIROCIN 2 % EX OINT
TOPICAL_OINTMENT | Freq: Two times a day (BID) | CUTANEOUS | Status: DC
  Administered 2023-09-17 – 2023-09-23 (×4): 1 via NASAL
  Filled 2023-09-12 (×5): qty 22

## 2023-09-12 MED ORDER — CHLORHEXIDINE GLUCONATE 4 % EX SOLN: 60.0000 mL | Freq: Once | CUTANEOUS | Status: DC

## 2023-09-12 MED ORDER — ORAL CARE MOUTH RINSE: 15.0000 mL | Freq: Once | OROMUCOSAL | Status: AC

## 2023-09-12 MED ORDER — CEFAZOLIN SODIUM-DEXTROSE 2-4 GM/100ML-% IV SOLN: 2.0000 g | INTRAVENOUS | Status: DC

## 2023-09-12 MED ORDER — FENTANYL CITRATE (PF) 250 MCG/5ML IJ SOLN
INTRAMUSCULAR | Status: AC
  Filled 2023-09-12: qty 5

## 2023-09-12 MED ORDER — ZINC SULFATE 220 (50 ZN) MG PO CAPS
220.0000 mg | ORAL_CAPSULE | Freq: Every day | ORAL | Status: DC
  Administered 2023-09-12 – 2023-09-23 (×12): 220 mg via ORAL
  Filled 2023-09-12 (×12): qty 1

## 2023-09-12 MED ORDER — CEFAZOLIN SODIUM-DEXTROSE 2-4 GM/100ML-% IV SOLN
2.0000 g | Freq: Three times a day (TID) | INTRAVENOUS | Status: DC
  Administered 2023-09-12: 2 g via INTRAVENOUS
  Filled 2023-09-12: qty 100

## 2023-09-12 MED ORDER — LIDOCAINE HCL (PF) 1 % IJ SOLN
INTRAMUSCULAR | Status: DC | PRN
  Administered 2023-09-12: 20 mL

## 2023-09-12 MED ORDER — POVIDONE-IODINE 10 % EX SWAB
2.0000 | Freq: Once | CUTANEOUS | Status: AC
  Administered 2023-09-12: 2 via TOPICAL

## 2023-09-12 MED ORDER — PROPOFOL 10 MG/ML IV BOLUS
INTRAVENOUS | Status: DC | PRN
  Administered 2023-09-12: 30 mg via INTRAVENOUS

## 2023-09-12 SURGICAL SUPPLY — 31 items
BAG COUNTER SPONGE SURGICOUNT (BAG) ×2 IMPLANT
BLADE SURG 21 STRL SS (BLADE) ×2 IMPLANT
BNDG COHESIVE 4X5 TAN STRL LF (GAUZE/BANDAGES/DRESSINGS) ×2 IMPLANT
BNDG COHESIVE 6X5 TAN NS LF (GAUZE/BANDAGES/DRESSINGS) IMPLANT
BNDG GAUZE DERMACEA FLUFF 4 (GAUZE/BANDAGES/DRESSINGS) ×2 IMPLANT
CANISTER WOUNDNEG PRESSURE 500 (CANNISTER) IMPLANT
COVER SURGICAL LIGHT HANDLE (MISCELLANEOUS) ×4 IMPLANT
DRAPE DERMATAC (DRAPES) IMPLANT
DRAPE INCISE IOBAN 66X45 STRL (DRAPES) IMPLANT
DRAPE U-SHAPE 47X51 STRL (DRAPES) ×4 IMPLANT
DRESSING PEEL AND PLC PRVNA 13 (GAUZE/BANDAGES/DRESSINGS) IMPLANT
DRSG ADAPTIC 3X8 NADH LF (GAUZE/BANDAGES/DRESSINGS) ×2 IMPLANT
DRSG PEEL AND PLACE PREVENA 13 (GAUZE/BANDAGES/DRESSINGS) ×4 IMPLANT
DURAPREP 26ML APPLICATOR (WOUND CARE) ×2 IMPLANT
ELECT REM PT RETURN 9FT ADLT (ELECTROSURGICAL) ×2 IMPLANT
ELECTRODE REM PT RTRN 9FT ADLT (ELECTROSURGICAL) ×2 IMPLANT
GAUZE PAD ABD 8X10 STRL (GAUZE/BANDAGES/DRESSINGS) ×4 IMPLANT
GAUZE SPONGE 4X4 12PLY STRL (GAUZE/BANDAGES/DRESSINGS) ×2 IMPLANT
GLOVE BIOGEL PI IND STRL 9 (GLOVE) ×2 IMPLANT
GLOVE SURG ORTHO 9.0 STRL STRW (GLOVE) ×2 IMPLANT
GOWN STRL REUS W/ TWL XL LVL3 (GOWN DISPOSABLE) ×4 IMPLANT
GRAFT SKIN WND MICRO 38 (Tissue) IMPLANT
KIT BASIN OR (CUSTOM PROCEDURE TRAY) ×2 IMPLANT
KIT TURNOVER KIT B (KITS) ×2 IMPLANT
NS IRRIG 1000ML POUR BTL (IV SOLUTION) ×2 IMPLANT
PACK ORTHO EXTREMITY (CUSTOM PROCEDURE TRAY) ×2 IMPLANT
PAD ARMBOARD 7.5X6 YLW CONV (MISCELLANEOUS) ×4 IMPLANT
SUT ETHILON 2 0 PSLX (SUTURE) ×2 IMPLANT
TOWEL GREEN STERILE (TOWEL DISPOSABLE) ×2 IMPLANT
TUBE CONNECTING 12X1/4 (SUCTIONS) ×2 IMPLANT
YANKAUER SUCT BULB TIP NO VENT (SUCTIONS) ×2 IMPLANT

## 2023-09-12 NOTE — Transfer of Care (Addendum)
 Immediate Anesthesia Transfer of Care Note  Patient: Henry Bryant  Procedure(s) Performed: AMPUTATION, FOOT, RAY (Bilateral) AMPUTATION, TOE (Bilateral: Toe)  Patient Location: PACU  Anesthesia Type:MAC  Level of Consciousness: drowsy and patient cooperative  Airway & Oxygen Therapy: Patient Spontanous Breathing  Post-op Assessment: Report given to RN, Post -op Vital signs reviewed and stable, and Patient moving all extremities X 4  Post vital signs: Reviewed and stable  Last Vitals:  Vitals Value Taken Time  BP 100/60 09/12/23 1055  Temp    Pulse 63 09/12/23 1058  Resp 14 09/12/23 1058  SpO2 97 % 09/12/23 1058  Vitals shown include unfiled device data.  Last Pain:  Vitals:   09/12/23 0937  TempSrc:   PainSc: 8       Patients Stated Pain Goal: 2 (09/11/23 2309)  Complications: No notable events documented.

## 2023-09-12 NOTE — Anesthesia Preprocedure Evaluation (Addendum)
 Anesthesia Evaluation  Patient identified by MRN, date of birth, ID band Patient awake    Reviewed: Allergy & Precautions, H&P , NPO status , Patient's Chart, lab work & pertinent test results, reviewed documented beta blocker date and time   Airway Mallampati: II  TM Distance: >3 FB Neck ROM: Full    Dental no notable dental hx. (+) Edentulous Upper, Edentulous Lower, Dental Advisory Given   Pulmonary COPD, former smoker   Pulmonary exam normal breath sounds clear to auscultation       Cardiovascular hypertension, Pt. on medications and Pt. on home beta blockers + CAD, + Peripheral Vascular Disease and +CHF   Rhythm:Regular Rate:Normal     Neuro/Psych negative neurological ROS  negative psych ROS   GI/Hepatic negative GI ROS, Neg liver ROS,,,  Endo/Other  diabetes, Insulin Dependent    Renal/GU negative Renal ROS  negative genitourinary   Musculoskeletal   Abdominal   Peds  Hematology negative hematology ROS (+)   Anesthesia Other Findings   Reproductive/Obstetrics negative OB ROS                             Anesthesia Physical Anesthesia Plan  ASA: 3  Anesthesia Plan: MAC   Post-op Pain Management: Tylenol PO (pre-op)*   Induction: Intravenous  PONV Risk Score and Plan: 3 and Ondansetron and Dexamethasone  Airway Management Planned: Natural Airway and Simple Face Mask  Additional Equipment:   Intra-op Plan:   Post-operative Plan:   Informed Consent: I have reviewed the patients History and Physical, chart, labs and discussed the procedure including the risks, benefits and alternatives for the proposed anesthesia with the patient or authorized representative who has indicated his/her understanding and acceptance.   Patient has DNR.  Discussed DNR with patient and Suspend DNR.   Dental advisory given  Plan Discussed with: CRNA  Anesthesia Plan Comments:         Anesthesia Quick Evaluation

## 2023-09-12 NOTE — Interval H&P Note (Signed)
 History and Physical Interval Note:  09/12/2023 6:47 AM  Henry Bryant  has presented today for surgery, with the diagnosis of Gangrene and Osteomyelitis Bilateral Feet.  The various methods of treatment have been discussed with the patient and family. After consideration of risks, benefits and other options for treatment, the patient has consented to  Procedure(s) with comments: AMPUTATION, FOOT, RAY (Bilateral) - Bilateral 1st Ray Amputation AMPUTATION, TOE (Bilateral) - Bilateral 2nd Toe Amputation as a surgical intervention.  The patient's history has been reviewed, patient examined, no change in status, stable for surgery.  I have reviewed the patient's chart and labs.  Questions were answered to the patient's satisfaction.     Nadara Mustard

## 2023-09-12 NOTE — Addendum Note (Signed)
 Addendum  created 09/12/23 1231 by Alease Medina, CRNA   Clinical Note Signed

## 2023-09-12 NOTE — Op Note (Signed)
 09/12/2023  10:47 AM  PATIENT:  Henry Bryant    PRE-OPERATIVE DIAGNOSIS:  Gangrene and Osteomyelitis Bilateral Feet  POST-OPERATIVE DIAGNOSIS:  Same  PROCEDURE:  AMPUTATION FIRST RAY LEFT FOOT. AMPUTATION FIRST RAY RIGHT FOOT. AMPUTATION SECOND RAY RIGHT FOOT. APPLICATION KERECIS MICRO GRAFT TOTAL OF 38 CM TO COVER BOTH WOUNDS.  LOCAL TISSUE TRANSFER REARRANGEMENT FOR WOUND CLOSURE 10 X 4 CM X 2. APPLICATION OF 13 CM PREVENA WOUND VAC X 2.   SURGEON:  Nadara Mustard, MD  PHYSICIAN ASSISTANT:None ANESTHESIA:   General  PREOPERATIVE INDICATIONS:  Henry Bryant is a  63 y.o. male with a diagnosis of Gangrene and Osteomyelitis Bilateral Feet who failed conservative measures and elected for surgical management.    The risks benefits and alternatives were discussed with the patient preoperatively including but not limited to the risks of infection, bleeding, nerve injury, cardiopulmonary complications, the need for revision surgery, among others, and the patient was willing to proceed.  OPERATIVE IMPLANTS:   Implant Name Type Inv. Item Serial No. Manufacturer Lot No. LRB No. Used Action  GRAFT SKIN WND MICRO 38 - [id number] Tissue GRAFT SKIN WND MICRO 38  [employer] [id number] Bilateral 1 Implanted    @ENCIMAGES @  OPERATIVE FINDINGS: Patient had minimal petechial bleeding.  The tissue margins were clear.  Would continue antibiotics for 24 hours.  OPERATIVE PROCEDURE: Patient was brought the operating room and underwent a MAC anesthetic.  After adequate levels anesthesia were obtained potions both lower extremities were prepped using DuraPrep draped into a sterile field a timeout was called.  Patient then underwent digital blocks with a total of 20 cc of 1% lidocaine plain.  Attention was first focused on the left lower extremity.  A racquet incision was made around the ulcerative tissue in the first ray.  The first ray was resected in 1 block of tissue.  This left a wound that  was 10 x 4 cm.  Electrocautery was used for hemostasis the wound was irrigated with Vashe.  The tissue margins were undermined for allow for local tissue transfer for wound closure.  Local tissue transfer was used to close the wound 10 x 4 cm and closed with 2-0 nylon.  Attention was then focused on the right lower extremity.  A racquet incision was made around the first and second ray and the first and second ray were resected in 1 block of tissue.  There was minimal petechial bleeding the tissue margins were clear.  Residual wound was 10 x 4 cm.  Tissue margins were undermined for allow for local tissue transfer for wound closure.  The wound was irrigated with Vashe.  The wound bed was filled with remainder of 38 cm of Kerecis micro graft.  Local tissue transfer was used to close the wound 10 x 4 cm with 2-0 nylon.  A 13 cm Prevena wound VAC was applied to both wounds.  Patient was taken the PACU in stable condition.   DISCHARGE PLANNING:  Antibiotic duration: Continue antibiotics for 24 hours  Weightbearing: Weightbearing as tolerated on both heels for transfers.  No gait training.  Pain medication: Continue current pain medicine.  Dressing care/ Wound VAC: Wound VAC x 2  Ambulatory devices: Transfer training  Discharge to: Anticipate discharge to skilled nursing.  Follow-up: In the office 1 week post operative.

## 2023-09-12 NOTE — Anesthesia Postprocedure Evaluation (Signed)
 Anesthesia Post Note  Patient: Henry Bryant  Procedure(s) Performed: AMPUTATION, FOOT, RAY (Bilateral) AMPUTATION, TOE (Bilateral: Toe)     Patient location during evaluation: PACU Anesthesia Type: MAC Level of consciousness: awake and alert Pain management: pain level controlled Vital Signs Assessment: post-procedure vital signs reviewed and stable Respiratory status: spontaneous breathing, nonlabored ventilation and respiratory function stable Cardiovascular status: stable and blood pressure returned to baseline Postop Assessment: no apparent nausea or vomiting Anesthetic complications: no  No notable events documented.  Last Vitals:  Vitals:   09/12/23 1115 09/12/23 1136  BP: (!) 106/55 (!) 109/58  Pulse: 62   Resp: 13 14  Temp: 36.5 C 36.4 C  SpO2: 97%     Last Pain:  Vitals:   09/12/23 1136  TempSrc: Oral  PainSc:                  Destyn Schuyler,W. EDMOND

## 2023-09-12 NOTE — Progress Notes (Signed)
 [facility]                                                                              Henry Bryant, is a 63 y.o. male, DOB - 1961-06-07, ZOX:[id number] Admit date - 09/06/2023    Outpatient Primary MD for the patient is Hasanaj, Myra Gianotti, MD  LOS - 6  days  No chief complaint on file.      Brief summary   63 y.o. male with medical history significant of type 2 diabetes mellitus, chronic systolic CHF, hypertension, pulmonary hypertension, A-fib/flutter, recent bilateral toe amputation by podiatry about 4 weeks ago, urinary retention and self-catheterization who comes into the hospital with rectal pain and a purulent discharge in that area.  He tells me he had a fall several days ago and fell on his buttocks, was feeling fine up until 1 to 2 days ago when he started noticing increased pain and just now his seeing discharge. He presented to [facility] emergency room.  Imaging there showed a large, loculated, rim-enhancing fluid collection within the left gluteus and left perirectal tissues measuring 9 x 6 x 7 0.2 x 11 cm in size, also showed emphysematous cystitis. Urinalysis concerning for UTI. He underwent an I&D in the ED, was given antibiotics. He also reports bilateral great toe osteomyelitis status post surgery few weeks back.   09/12/2023: Patient underwent surgery today: AMPUTATION FIRST RAY LEFT FOOT. AMPUTATION FIRST RAY RIGHT FOOT. AMPUTATION SECOND RAY RIGHT FOOT. APPLICATION KERECIS MICRO GRAFT TOTAL OF 38 CM TO COVER BOTH WOUNDS.  LOCAL TISSUE TRANSFER REARRANGEMENT FOR WOUND CLOSURE 10 X 4 CM X 2. APPLICATION OF 13 CM PREVENA WOUND VAC X 2.  Patient seen.  No new complaints today.  Input from vascular surgery and infectious disease teams appreciated.  Assessment & Plan    Principal Problem: Sepsis due to rectal abscess Klebsiella pneumonia bacteremia -Imaging at [facility] had shown large loculated collection in the left buttocks and perirectal  tissues, underwent I&D there, catheter was placed into the abscess cavity to help facilitate drainage. -CT abdomen 3/3 showed significant improvement in the left perirectal and left buttock region compared to prior exam.  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 -Seen by surgery, Dr. Dwain Sarna, recommended discharge on 10 days of antibiotics and follow-up in [facility] with [facility] surgeon -Continue IV Unasyn, ID following   -Blood cultures at [facility] positive for Klebsiella pneumonia, urine culture here on 3/1 positive for Klebsiella -Pain control increased per patient request, added bowel regimen 09/12/2023: Patient is currently on Augmentin and doxycycline.  Infectious disease input is highly appreciated.  Emphysematous cystitis, Klebsiella UTI -Seen by urology on 3/3, Dr. Lafonda Mosses, recommended continuing Foley catheter -Repeat CT abdomen showed improved emphysematous cystitis -Continue IV Unasyn 09/12/2023: Patient is currently on Augmentin and doxycycline.   Diabetic ulcer of toes, osteomyelitis bilateral first metatarsals -Prior toe amputation at [facility] -R in 08/2023.   -MRI of the toes showed prior amputation of the great toe with osteomyelitis of the distal half of the first metatarsal on the left and right, no abscess.  - Vascular surgery requested orthopedic opinion.   -Seen by Dr. Lajoyce Corners,  recommended first ray and second toe amputation bilaterally dressing changes to both knees -Surgery timing per orthopedics, Dr. Lajoyce Corners 09/12/2023: Patient has undergone surgery.   Peripheral arterial disease with history of revascularization -Followed by vascular surgery outpatient, last saw Dr. Hetty Blend, underwent lower extremity angiogram with revascularization in 06/2023  -Given poor healing and findings of osteomyelitis, vascular surgery recommended orthopedics consult   Insulin-dependent diabetes mellitus, uncontrolled with with hyperglycemia- -hemoglobin A1c 6.8 on 3/1  CBG (last 3)  Recent Labs     09/12/23 0833 09/12/23 1059 09/12/23 1640  GLUCAP 128* 195* 193*   -Continue Lantus 30 units at bedtime, SSI moderate     Chronic systolic CHF -euvolemic,  -Continue Toprol-XL, low-dose Entresto, Crestor    Atrial flutter -Continue amiodarone, metoprolol -Eliquis currently on hold in anticipation of surgical intervention  Hypokalemia -K 3.5 today.  Estimated body mass index is 20.09 kg/m as calculated from the following:   Height as of this encounter: 5\' 10"  (1.778 m).   Weight as of this encounter: 63.5 kg.  Code Status: DNR DVT Prophylaxis:  SCD's Start: 09/12/23 1141   Level of Care: Level of care: Telemetry Medical Family Communication: Updated patient Disposition Plan:      Remains inpatient appropriate:      Procedures:    Consultants:   General surgery Urology Vascular surgery Infectious ease  Antimicrobials:   Anti-infectives (From admission, onward)    Start     Dose/Rate Route Frequency Ordered Stop   09/13/23 1300  amoxicillin-clavulanate (AUGMENTIN) 875-125 MG per tablet 1 tablet        1 tablet Oral Every 12 hours 09/12/23 1134 09/16/23 0959   09/12/23 1230  ceFAZolin (ANCEF) IVPB 2g/100 mL premix        2 g 200 mL/hr over 30 Minutes Intravenous Every 8 hours 09/12/23 1140 09/13/23 0429   09/12/23 0945  vancomycin (VANCOCIN) IVPB 1000 mg/200 mL premix        1,000 mg 200 mL/hr over 60 Minutes Intravenous To Surgery 09/12/23 0933 09/13/23 0945   09/12/23 0935  vancomycin (VANCOCIN) 1-5 GM/200ML-% IVPB       Note to Pharmacy: Isabel Caprice, Destiny: cabinet override      09/12/23 0935 09/12/23 1041   09/12/23 0915  ceFAZolin (ANCEF) IVPB 2g/100 mL premix  Status:  Discontinued        2 g 200 mL/hr over 30 Minutes Intravenous On call to O.R. 09/12/23 0902 09/12/23 1125   09/09/23 1300  Ampicillin-Sulbactam (UNASYN) 3 g in sodium chloride 0.9 % 100 mL IVPB        3 g 200 mL/hr over 30 Minutes Intravenous Every 6 hours 09/09/23 1204 09/13/23 1200    09/09/23 1300  doxycycline (VIBRA-TABS) tablet 100 mg        100 mg Oral Every 12 hours 09/09/23 1204 09/15/23 2359   09/08/23 2000  linezolid (ZYVOX) IVPB 600 mg  Status:  Discontinued        600 mg 300 mL/hr over 60 Minutes Intravenous Every 12 hours 09/08/23 1117 09/09/23 1204   09/07/23 0800  vancomycin (VANCOREADY) IVPB 750 mg/150 mL  Status:  Discontinued        750 mg 150 mL/hr over 60 Minutes Intravenous Every 12 hours 09/06/23 1834 09/08/23 1117   09/06/23 1930  vancomycin (VANCOCIN) IVPB 1000 mg/200 mL premix        1,000 mg 200 mL/hr over 60 Minutes Intravenous STAT 09/06/23 1834 09/06/23 2300   09/06/23 1930  piperacillin-tazobactam (ZOSYN) IVPB 3.375  g  Status:  Discontinued        3.375 g 12.5 mL/hr over 240 Minutes Intravenous Every 8 hours 09/06/23 1834 09/09/23 1204          Medications  ALPRAZolam  0.5 mg Oral QHS   amiodarone  200 mg Oral Daily   [START ON 09/13/2023] amoxicillin-clavulanate  1 tablet Oral Q12H   vitamin C  1,000 mg Oral Daily   Chlorhexidine Gluconate Cloth  6 each Topical Daily   docusate sodium  100 mg Oral BID   doxycycline  100 mg Oral Q12H   enoxaparin (LOVENOX) injection  60 mg Subcutaneous Q12H   insulin aspart  0-15 Units Subcutaneous TID WC   insulin aspart  0-5 Units Subcutaneous QHS   insulin glargine  30 Units Subcutaneous QHS   leptospermum manuka honey  1 Application Topical Daily   metoprolol succinate  25 mg Oral Daily   mupirocin ointment   Nasal BID   nutrition supplement (JUVEN)  1 packet Oral BID BM   pantoprazole  40 mg Oral QHS   pantoprazole  40 mg Oral Daily   polyethylene glycol  17 g Oral Daily   rosuvastatin  40 mg Oral Daily   sacubitril-valsartan  1 tablet Oral BID   tamsulosin  0.4 mg Oral Daily   zinc sulfate (50mg  elemental zinc)  220 mg Oral Daily      Subjective:   Henry Bryant was seen and examined today.  Complaining of rectal pain not well-controlled with current pain medications, constipation+  no nausea vomiting, chest pain, shortness of breath, fevers.   Objective:   Vitals:   09/12/23 1245 09/12/23 1300 09/12/23 1615 09/12/23 1630  BP: (!) 107/51 (!) 107/55 (!) 121/59 120/64  Pulse: 60 (!) 58 62 61  Resp: 19 20 16 18   Temp:    97.6 F (36.4 C)  TempSrc:  Oral  Oral  SpO2: 99% 100% 100% 98%  Weight:      Height:        Intake/Output Summary (Last 24 hours) at 09/12/2023 1802 Last data filed at 09/12/2023 1707 Gross per 24 hour  Intake 2514.67 ml  Output 1595 ml  Net 919.67 ml     Wt Readings from Last 3 Encounters:  09/12/23 63.5 kg  07/07/23 72.6 kg  05/09/23 78 kg    Physical Exam General: Awake and alert.  Not in any distress.   Cardiovascular: S1 S2 clear.  Respiratory: Clear to auscultation. Gastrointestinal: Soft and nontender. Ext: Amputation of right first and second toes, and amputation of first left today, 09/12/2023. Neuro: Patient moves all extremities.    GU: Foley+    Data Reviewed:  I have personally reviewed following labs    CBC Lab Results  Component Value Date   WBC 8.5 09/12/2023   RBC 3.71 (L) 09/12/2023   HGB 10.5 (L) 09/12/2023   HCT 32.8 (L) 09/12/2023   MCV 88.4 09/12/2023   MCH 28.3 09/12/2023   PLT 443 (H) 09/12/2023   MCHC 32.0 09/12/2023   RDW 15.9 (H) 09/12/2023   LYMPHSABS 1.2 09/06/2023   MONOABS 0.8 09/06/2023   EOSABS 0.0 09/06/2023   BASOSABS 0.0 09/06/2023     Last metabolic panel Lab Results  Component Value Date   NA 138 09/12/2023   K 3.5 09/12/2023   CL 108 09/12/2023   CO2 24 09/12/2023   BUN 11 09/12/2023   CREATININE 0.78 09/12/2023   GLUCOSE 123 (H) 09/12/2023   GFRNONAA >60 09/12/2023  CALCIUM 7.9 (L) 09/12/2023   PHOS 2.8 09/12/2023   PROT 5.6 (L) 09/09/2023   ALBUMIN 2.2 (L) 09/12/2023   BILITOT 0.4 09/09/2023   ALKPHOS 65 09/09/2023   AST 13 (L) 09/09/2023   ALT 17 09/09/2023   ANIONGAP 6 09/12/2023    CBG (last 3)  Recent Labs    09/12/23 0833 09/12/23 1059  09/12/23 1640  GLUCAP 128* 195* 193*      Coagulation Profile: No results for input(s): "INR", "PROTIME" in the last 168 hours.   Radiology Studies: I have personally reviewed the imaging studies  No results found.      Barnetta Chapel M.D. [facility] 09/12/2023, 6:02 PM  Available via Epic secure chat 7am-7pm After 7 pm, please refer to night coverage provider listed on amion.

## 2023-09-12 NOTE — Progress Notes (Signed)
 Orthopedic Tech Progress Note Patient Details:  GERRIT RAFALSKI 07/11/1960 [id number]  Patient ID: Deliah Boston, male   DOB: 11/07/1960, 63 y.o.   MRN: [id number]  Lovett Calender 09/12/2023, 12:52 PM Patient declined Post op shoes. Stated they are a tripping hazard for him.

## 2023-09-12 NOTE — Plan of Care (Signed)
 ID brief note   Diagnosis: Bilateral foot first ray om and 2nd ray right foot om s/p on 3/7 respective ray amputation by Dr Lajoyce Corners. Reviewed note appears to have obtained clear margin  Kleb pna bacteremia 3/1; perirectal abscess s/p I&D. Surgery last seen 09/09/23 -- no further I&D needed. Packet out and wound drain left  Imaging: 3/3 abd pelv ct Significant improvement in the degree of fluid in the left perirectal and left buttock region when compare with the prior exam. Small drainage catheter and gauze packing is noted within. A mild residual fluid collection is noted as described.   CT cystogram shows no evidence of extravasation. The findings of emphysematous cystitis have resolved in the interval. Some residual bladder wall thickening is noted.   Mild fullness in the left renal collecting system although improved from the prior exam follow-up decompression of the bladder.    Labs: Lab Results  Component Value Date   WBC 8.5 09/12/2023   HGB 10.5 (L) 09/12/2023   HCT 32.8 (L) 09/12/2023   MCV 88.4 09/12/2023   PLT 443 (H) 09/12/2023   Last metabolic panel Lab Results  Component Value Date   GLUCOSE 123 (H) 09/12/2023   NA 138 09/12/2023   K 3.5 09/12/2023   CL 108 09/12/2023   CO2 24 09/12/2023   BUN 11 09/12/2023   CREATININE 0.78 09/12/2023   GFRNONAA >60 09/12/2023   CALCIUM 7.9 (L) 09/12/2023   PHOS 2.8 09/12/2023   PROT 5.6 (L) 09/09/2023   ALBUMIN 2.2 (L) 09/12/2023   BILITOT 0.4 09/09/2023   ALKPHOS 65 09/09/2023   AST 13 (L) 09/09/2023   ALT 17 09/09/2023   ANIONGAP 6 09/12/2023      A/p Perirectal abscess s/p I&D and good source control Kleb pna bsi 3/1 Bilateral foot om s/p bilateral first ray and right foot 2nd ray amputation 3/7 good margin -- no cx sent   -would complete 3 more days abx to finish 10 days for bacteremia/rectal wound. This would cover mop up therapy for the feet as well -no need for id clinic f/u -doxy and amp/sulbactam or  transition to full oral doxy and amox-clav when tolerating PO -maintain standard isolation protocol -id will sign off -discussed with primary team

## 2023-09-12 NOTE — Plan of Care (Signed)
  Problem: Education: Goal: Knowledge of General Education information will improve Description: Including pain rating scale, medication(s)/side effects and non-pharmacologic comfort measures Outcome: Progressing   Problem: Clinical Measurements: Goal: Respiratory complications will improve Outcome: Progressing   Problem: Coping: Goal: Level of anxiety will decrease Outcome: Progressing   Problem: Education: Goal: Knowledge of General Education information will improve Description: Including pain rating scale, medication(s)/side effects and non-pharmacologic comfort measures Outcome: Progressing

## 2023-09-13 DIAGNOSIS — K611 Rectal abscess: Secondary | ICD-10-CM | POA: Diagnosis not present

## 2023-09-13 LAB — CBC WITH DIFFERENTIAL/PLATELET
Abs Immature Granulocytes: 0.11 10*3/uL — ABNORMAL HIGH (ref 0.00–0.07)
Basophils Absolute: 0 10*3/uL (ref 0.0–0.1)
Basophils Relative: 0 %
Eosinophils Absolute: 0.1 10*3/uL (ref 0.0–0.5)
Eosinophils Relative: 1 %
HCT: 33.8 % — ABNORMAL LOW (ref 39.0–52.0)
Hemoglobin: 10.6 g/dL — ABNORMAL LOW (ref 13.0–17.0)
Immature Granulocytes: 1 %
Lymphocytes Relative: 15 %
Lymphs Abs: 1.9 10*3/uL (ref 0.7–4.0)
MCH: 28.6 pg (ref 26.0–34.0)
MCHC: 31.4 g/dL (ref 30.0–36.0)
MCV: 91.4 fL (ref 80.0–100.0)
Monocytes Absolute: 0.6 10*3/uL (ref 0.1–1.0)
Monocytes Relative: 4 %
Neutro Abs: 10.3 10*3/uL — ABNORMAL HIGH (ref 1.7–7.7)
Neutrophils Relative %: 79 %
Platelets: 443 10*3/uL — ABNORMAL HIGH (ref 150–400)
RBC: 3.7 MIL/uL — ABNORMAL LOW (ref 4.22–5.81)
RDW: 16.2 % — ABNORMAL HIGH (ref 11.5–15.5)
WBC: 13 10*3/uL — ABNORMAL HIGH (ref 4.0–10.5)
nRBC: 0 % (ref 0.0–0.2)

## 2023-09-13 LAB — RENAL FUNCTION PANEL
Albumin: 2.4 g/dL — ABNORMAL LOW (ref 3.5–5.0)
Anion gap: 9 (ref 5–15)
BUN: 15 mg/dL (ref 8–23)
CO2: 22 mmol/L (ref 22–32)
Calcium: 8.2 mg/dL — ABNORMAL LOW (ref 8.9–10.3)
Chloride: 109 mmol/L (ref 98–111)
Creatinine, Ser: 0.87 mg/dL (ref 0.61–1.24)
GFR, Estimated: >60 mL/min (ref >60)
Glucose, Bld: 95 mg/dL (ref 70–99)
Phosphorus: 3.3 mg/dL (ref 2.5–4.6)
Potassium: 3.9 mmol/L (ref 3.5–5.1)
Sodium: 140 mmol/L (ref 135–145)

## 2023-09-13 LAB — GLUCOSE, CAPILLARY
Glucose-Capillary: 108 mg/dL — ABNORMAL HIGH (ref 70–99)
Glucose-Capillary: 118 mg/dL — ABNORMAL HIGH (ref 70–99)
Glucose-Capillary: 174 mg/dL — ABNORMAL HIGH (ref 70–99)
Glucose-Capillary: 140 mg/dL — ABNORMAL HIGH (ref 70–99)

## 2023-09-13 LAB — MAGNESIUM: Magnesium: 1.6 mg/dL — ABNORMAL LOW (ref 1.7–2.4)

## 2023-09-13 NOTE — Progress Notes (Signed)
 [facility]                                                                              Henry Bryant, is a 63 y.o. male, DOB - 1960/10/18, ZOX:[id number] Admit date - 09/06/2023    Outpatient Primary MD for the patient is Hasanaj, Myra Gianotti, MD  LOS - 7  days  No chief complaint on file.      Brief summary   63 y.o. male with medical history significant of type 2 diabetes mellitus, chronic systolic CHF, hypertension, pulmonary hypertension, A-fib/flutter, recent bilateral toe amputation by podiatry about 4 weeks ago, urinary retention and self-catheterization who comes into the hospital with rectal pain and a purulent discharge in that area.  He tells me he had a fall several days ago and fell on his buttocks, was feeling fine up until 1 to 2 days ago when he started noticing increased pain and just now his seeing discharge. He presented to [facility] emergency room.  Imaging there showed a large, loculated, rim-enhancing fluid collection within the left gluteus and left perirectal tissues measuring 9 x 6 x 7 0.2 x 11 cm in size, also showed emphysematous cystitis. Urinalysis concerning for UTI. He underwent an I&D in the ED, was given antibiotics. He also reports bilateral great toe osteomyelitis status post surgery few weeks back.   09/12/2023: Patient underwent surgery today: AMPUTATION FIRST RAY LEFT FOOT. AMPUTATION FIRST RAY RIGHT FOOT. AMPUTATION SECOND RAY RIGHT FOOT. APPLICATION KERECIS MICRO GRAFT TOTAL OF 38 CM TO COVER BOTH WOUNDS.  LOCAL TISSUE TRANSFER REARRANGEMENT FOR WOUND CLOSURE 10 X 4 CM X 2. APPLICATION OF 13 CM PREVENA WOUND VAC X 2.  09/13/2023: Patient seen.  No new complaints.  Postop day 1.  Orthopedic surgery input is highly appreciated.  Assessment & Plan    Principal Problem: Sepsis due to rectal abscess Klebsiella pneumonia bacteremia -Imaging at [facility] had shown large loculated collection in the left buttocks and perirectal  tissues, underwent I&D there, catheter was placed into the abscess cavity to help facilitate drainage. -CT abdomen 3/3 showed significant improvement in the left perirectal and left buttock region compared to prior exam.  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 -Seen by surgery, Dr. Dwain Sarna, recommended discharge on 10 days of antibiotics and follow-up in [facility] with [facility] surgeon -Continue IV Unasyn, ID following   -Blood cultures at [facility] positive for Klebsiella pneumonia, urine culture here on 3/1 positive for Klebsiella -Pain control increased per patient request, added bowel regimen 09/13/2023: Patient is currently on Augmentin and doxycycline.  Infectious disease input is highly appreciated.  Emphysematous cystitis, Klebsiella UTI -Seen by urology on 3/3, Dr. Lafonda Mosses, recommended continuing Foley catheter -Repeat CT abdomen showed improved emphysematous cystitis -Continue IV Unasyn 09/13/2023: Patient is currently on Augmentin and doxycycline.   Diabetic ulcer of toes, osteomyelitis bilateral first metatarsals -Prior toe amputation at [facility] -R in 08/2023.   -MRI of the toes showed prior amputation of the great toe with osteomyelitis of the distal half of the first metatarsal on the left and right, no abscess.  - Vascular surgery requested orthopedic opinion.   -Seen by Dr.  Lajoyce Corners, recommended first ray and second toe amputation bilaterally dressing changes to both knees -Surgery timing per orthopedics, Dr. Lajoyce Corners 09/13/2023: Postop day 1.  Orthopedic team is directing care.  Patient seems stable.     Peripheral arterial disease with history of revascularization -Followed by vascular surgery outpatient, last saw Dr. Hetty Blend, underwent lower extremity angiogram with revascularization in 06/2023  -Given poor healing and findings of osteomyelitis, vascular surgery recommended orthopedics consult   Insulin-dependent diabetes mellitus, uncontrolled with with hyperglycemia- -hemoglobin A1c 6.8 on  3/1  CBG (last 3)  Recent Labs    09/13/23 0548 09/13/23 1116 09/13/23 1633  GLUCAP 108* 118* 174*   -Continue Lantus 30 units at bedtime, SSI moderate     Chronic systolic CHF -euvolemic,  -Continue Toprol-XL, low-dose Entresto, Crestor    Atrial flutter -Continue amiodarone, metoprolol -Eliquis currently on hold in anticipation of surgical intervention  Hypokalemia -K 3.9 today.  Estimated body mass index is 20.09 kg/m as calculated from the following:   Height as of this encounter: 5\' 10"  (1.778 m).   Weight as of this encounter: 63.5 kg.  Code Status: DNR DVT Prophylaxis:  SCD's Start: 09/12/23 1141   Level of Care: Level of care: Telemetry Medical Family Communication: Updated patient Disposition Plan:      Remains inpatient appropriate:      Procedures:  AMPUTATION FIRST RAY LEFT FOOT. AMPUTATION FIRST RAY RIGHT FOOT. AMPUTATION SECOND RAY RIGHT FOOT. APPLICATION KERECIS MICRO GRAFT TOTAL OF 38 CM TO COVER BOTH WOUNDS.  LOCAL TISSUE TRANSFER REARRANGEMENT FOR WOUND CLOSURE 10 X 4 CM X 2. APPLICATION OF 13 CM PREVENA WOUND VAC X 2.  Consultants:   General surgery Urology Vascular surgery Infectious ease  Antimicrobials:   Anti-infectives (From admission, onward)    Start     Dose/Rate Route Frequency Ordered Stop   09/13/23 1300  amoxicillin-clavulanate (AUGMENTIN) 875-125 MG per tablet 1 tablet        1 tablet Oral Every 12 hours 09/12/23 1134 09/16/23 0959   09/12/23 1230  ceFAZolin (ANCEF) IVPB 2g/100 mL premix  Status:  Discontinued        2 g 200 mL/hr over 30 Minutes Intravenous Every 8 hours 09/12/23 1140 09/12/23 1930   09/12/23 0945  vancomycin (VANCOCIN) IVPB 1000 mg/200 mL premix        1,000 mg 200 mL/hr over 60 Minutes Intravenous To Surgery 09/12/23 0933 09/13/23 0945   09/12/23 0935  vancomycin (VANCOCIN) 1-5 GM/200ML-% IVPB       Note to Pharmacy: Isabel Caprice, Destiny: cabinet override      09/12/23 0935 09/12/23 1041   09/12/23 0915   ceFAZolin (ANCEF) IVPB 2g/100 mL premix  Status:  Discontinued        2 g 200 mL/hr over 30 Minutes Intravenous On call to O.R. 09/12/23 0902 09/12/23 1125   09/09/23 1300  Ampicillin-Sulbactam (UNASYN) 3 g in sodium chloride 0.9 % 100 mL IVPB        3 g 200 mL/hr over 30 Minutes Intravenous Every 6 hours 09/09/23 1204 09/13/23 0721   09/09/23 1300  doxycycline (VIBRA-TABS) tablet 100 mg        100 mg Oral Every 12 hours 09/09/23 1204 09/15/23 2359   09/08/23 2000  linezolid (ZYVOX) IVPB 600 mg  Status:  Discontinued        600 mg 300 mL/hr over 60 Minutes Intravenous Every 12 hours 09/08/23 1117 09/09/23 1204   09/07/23 0800  vancomycin (VANCOREADY) IVPB 750 mg/150 mL  Status:  Discontinued        750 mg 150 mL/hr over 60 Minutes Intravenous Every 12 hours 09/06/23 1834 09/08/23 1117   09/06/23 1930  vancomycin (VANCOCIN) IVPB 1000 mg/200 mL premix        1,000 mg 200 mL/hr over 60 Minutes Intravenous STAT 09/06/23 1834 09/06/23 2300   09/06/23 1930  piperacillin-tazobactam (ZOSYN) IVPB 3.375 g  Status:  Discontinued        3.375 g 12.5 mL/hr over 240 Minutes Intravenous Every 8 hours 09/06/23 1834 09/09/23 1204          Medications  ALPRAZolam  0.5 mg Oral QHS   amiodarone  200 mg Oral Daily   amoxicillin-clavulanate  1 tablet Oral Q12H   vitamin C  1,000 mg Oral Daily   Chlorhexidine Gluconate Cloth  6 each Topical Daily   docusate sodium  100 mg Oral BID   doxycycline  100 mg Oral Q12H   enoxaparin (LOVENOX) injection  60 mg Subcutaneous Q12H   insulin aspart  0-15 Units Subcutaneous TID WC   insulin aspart  0-5 Units Subcutaneous QHS   insulin glargine  30 Units Subcutaneous QHS   leptospermum manuka honey  1 Application Topical Daily   metoprolol succinate  25 mg Oral Daily   mupirocin ointment   Nasal BID   nutrition supplement (JUVEN)  1 packet Oral BID BM   pantoprazole  40 mg Oral QHS   pantoprazole  40 mg Oral Daily   polyethylene glycol  17 g Oral Daily    rosuvastatin  40 mg Oral Daily   sacubitril-valsartan  1 tablet Oral BID   tamsulosin  0.4 mg Oral Daily   zinc sulfate (50mg  elemental zinc)  220 mg Oral Daily      Subjective:   Henry Bryant was seen and examined today.  Complaining of rectal pain not well-controlled with current pain medications, constipation+ no nausea vomiting, chest pain, shortness of breath, fevers.   Objective:   Vitals:   09/13/23 0900 09/13/23 1000 09/13/23 1112 09/13/23 1631  BP: (!) 116/53 (!) 134/59 (!) 110/49 (!) 101/45  Pulse: 77 78 74 69  Resp: 10 14 16 15   Temp:   98.3 F (36.8 C) 98.5 F (36.9 C)  TempSrc:      SpO2: 98% 100% 100% 98%  Weight:      Height:        Intake/Output Summary (Last 24 hours) at 09/13/2023 1722 Last data filed at 09/13/2023 1639 Gross per 24 hour  Intake 187.5 ml  Output 1300 ml  Net -1112.5 ml     Wt Readings from Last 3 Encounters:  09/12/23 63.5 kg  07/07/23 72.6 kg  05/09/23 78 kg    Physical Exam General: Awake and alert.  Not in any distress.   Cardiovascular: S1 S2 clear.  Respiratory: Clear to auscultation. Gastrointestinal: Soft and nontender. Ext: Amputation of right first and second toes, and amputation of first left today, 09/12/2023. Neuro: Patient moves all extremities.    GU: Foley+    Data Reviewed:  I have personally reviewed following labs    CBC Lab Results  Component Value Date   WBC 13.0 (H) 09/13/2023   RBC 3.70 (L) 09/13/2023   HGB 10.6 (L) 09/13/2023   HCT 33.8 (L) 09/13/2023   MCV 91.4 09/13/2023   MCH 28.6 09/13/2023   PLT 443 (H) 09/13/2023   MCHC 31.4 09/13/2023   RDW 16.2 (H) 09/13/2023   LYMPHSABS 1.9 09/13/2023   MONOABS 0.6 09/13/2023  EOSABS 0.1 09/13/2023   BASOSABS 0.0 09/13/2023     Last metabolic panel Lab Results  Component Value Date   NA 140 09/13/2023   K 3.9 09/13/2023   CL 109 09/13/2023   CO2 22 09/13/2023   BUN 15 09/13/2023   CREATININE 0.87 09/13/2023   GLUCOSE 95 09/13/2023    GFRNONAA >60 09/13/2023   CALCIUM 8.2 (L) 09/13/2023   PHOS 3.3 09/13/2023   PROT 5.6 (L) 09/09/2023   ALBUMIN 2.4 (L) 09/13/2023   BILITOT 0.4 09/09/2023   ALKPHOS 65 09/09/2023   AST 13 (L) 09/09/2023   ALT 17 09/09/2023   ANIONGAP 9 09/13/2023    CBG (last 3)  Recent Labs    09/13/23 0548 09/13/23 1116 09/13/23 1633  GLUCAP 108* 118* 174*      Coagulation Profile: No results for input(s): "INR", "PROTIME" in the last 168 hours.   Radiology Studies: I have personally reviewed the imaging studies  No results found.    Time spent: 35 minutes.  Barnetta Chapel M.D. [facility] 09/13/2023, 5:22 PM  Available via Epic secure chat 7am-7pm After 7 pm, please refer to night coverage provider listed on amion.

## 2023-09-13 NOTE — Plan of Care (Signed)

## 2023-09-13 NOTE — Plan of Care (Signed)

## 2023-09-13 NOTE — Evaluation (Addendum)
 Physical Therapy Evaluation Patient Details Name: Henry Bryant MRN: [id number] DOB: [birth year] Today's Date: 09/13/2023  History of Present Illness  63 y.o. male presents to [facility] 09/06/23 from [facility] with rectal pain and purulent discharge. Hx of prior fall several days prior. Pt w/ sepsis 2/2 rectal abscess and emphysematous cystitis, s/p I&D at [facility]. Pt also has osteomyelitis of B first metatarsals with ulcers of first metatarsal head B. 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 PMHx: T2DM, chronic systolic CHF, hypertension, pulmonary hypertension, A-fib/flutter, recent bilateral toe amputation by podiatry about 4 weeks ago   Clinical Impression  Pt in bed upon arrival and agreeable to limited PT eval. PTA, pt would laterally scoot to a power WC for mobility. Pt was limited by pain and fatigue in today's session and declined sitting EOB or transferring. Pt was able to roll with CGA for assist and use of bed rail. Educated pt on importance of mobility with pt agreeing to transfer in following PT/OT sessions. Discussed exercises in supine and WB precautions with pt. Pt will have 24/7 physical assist available at home from daughter and is setting up home care services for 2-3 hours a day, 3-4 days a week. Pt prefers to go home and would not go to a skilled rehab facility if recommended. Pt currently with functional limitations due to the deficits listed below (see PT Problem List). Pt would benefit from acute skilled PT to address functional impairments. Recommending post-acute HHPT to work towards independence with mobility. Acute PT to follow.         If plan is discharge home, recommend the following: A lot of help with walking and/or transfers;A little help with bathing/dressing/bathroom;Assistance with cooking/housework;Assist for transportation;Help with stairs or ramp for entrance     Equipment Recommendations None recommended by PT      Functional Status Assessment Patient has had a recent decline in their functional status and demonstrates the ability to make significant improvements in function in a reasonable and predictable amount of time.     Precautions / Restrictions Precautions Precautions: Fall Recall of Precautions/Restrictions: Intact Precaution/Restrictions Comments: x2 wound vacs, catheter Required Braces or Orthoses: Other Brace Other Brace: B post op shoes (pt refused to accept post-op shoes from vendor, not in room) Restrictions Weight Bearing Restrictions Per Provider Order: Yes RLE Weight Bearing Per Provider Order:  (Heel WB) LLE Weight Bearing Per Provider Order:  (Heel WB) Other Position/Activity Restrictions: WB on heels only for transfers, no gait training      Mobility  Bed Mobility Overal bed mobility: Needs Assistance Bed Mobility: Rolling Rolling: Contact guard assist, Used rails    General bed mobility comments: pt able to roll to the left with CGA for safety and use of bed rail. Declined further mobility    Transfers    General transfer comment: pt declined         Pertinent Vitals/Pain Pain Assessment Pain Assessment: Faces Faces Pain Scale: Hurts whole lot Pain Location: rectum, B feet Pain Descriptors / Indicators: Discomfort, Aching Pain Intervention(s): Limited activity within patient's tolerance, Monitored during session, Premedicated before session, Repositioned    Home Living Family/patient expects to be discharged to:: Private residence Living Arrangements: Children (daughter) Available Help at Discharge: Family;Available 24 hours/day Type of Home: House Home Access: Ramped entrance       Home Layout: One level Home Equipment: Wheelchair - power;Grab bars - tub/shower;Grab bars - toilet;Shower Scientist, physiological (2  wheels);Cane - single point;Rollator (4 wheels) Additional Comments: will have home care 2-3 hours 3-4 times a week. Daughter is Charity fundraiser  and will be there 24/7 as needed    Prior Function Prior Level of Function : Independent/Modified Independent      Mobility Comments: laterally scooted to power WC ADLs Comments: ind     Extremity/Trunk Assessment   Upper Extremity Assessment Upper Extremity Assessment: Defer to OT evaluation    Lower Extremity Assessment Lower Extremity Assessment: RLE deficits/detail;LLE deficits/detail RLE Deficits / Details: First and second ray amptutation on R foot, at least 3/5 grossly RLE: Unable to fully assess due to pain RLE Sensation: decreased light touch (on dorsum, plantar surface, and digits) LLE Deficits / Details: amputation of first ray on L foot, at least 3/5 grossly LLE: Unable to fully assess due to pain LLE Sensation: decreased light touch (dorsum of foot, plantar surface, digits)    Cervical / Trunk Assessment Cervical / Trunk Assessment: Normal  Communication   Communication Communication: No apparent difficulties    Cognition Arousal: Alert Behavior During Therapy: WFL for tasks assessed/performed   PT - Cognitive impairments: No apparent impairments    Following commands: Intact       Cueing Cueing Techniques: Verbal cues     General Comments General comments (skin integrity, edema, etc.): Ace wrap on B feet to lower shins. Educated on importance of mobility. Discussed performing SLR and knee flexion exercises in supine     PT Assessment Patient needs continued PT services  PT Problem List Decreased strength;Decreased range of motion;Decreased balance;Decreased activity tolerance;Decreased mobility;Decreased skin integrity       PT Treatment Interventions DME instruction;Functional mobility training;Therapeutic activities;Therapeutic exercise;Balance training;Neuromuscular re-education;Patient/family education    PT Goals (Current goals can be found in the Care Plan section)  Acute Rehab PT Goals Patient Stated Goal: to go home PT Goal Formulation:  With patient Time For Goal Achievement: 09/27/23 Potential to Achieve Goals: Good    Frequency Min 3X/week        AM-PAC PT "6 Clicks" Mobility  Outcome Measure Help needed turning from your back to your side while in a flat bed without using bedrails?: A Little Help needed moving from lying on your back to sitting on the side of a flat bed without using bedrails?: A Little Help needed moving to and from a bed to a chair (including a wheelchair)?: A Lot Help needed standing up from a chair using your arms (e.g., wheelchair or bedside chair)?: Total Help needed to walk in hospital room?: Total Help needed climbing 3-5 steps with a railing? : Total 6 Click Score: 11    End of Session   Activity Tolerance: Patient limited by pain Patient left: in bed;with call bell/phone within reach Nurse Communication: Mobility status PT Visit Diagnosis: Other abnormalities of gait and mobility (R26.89);Muscle weakness (generalized) (M62.81)    Time: 6578-4696 PT Time Calculation (min) (ACUTE ONLY): 19 min   Charges:   PT Evaluation $PT Eval Low Complexity: 1 Low   PT General Charges $$ ACUTE PT VISIT: 1 Visit        Hilton Cork, PT, DPT Secure Chat Preferred  Rehab Office [phone]   Arturo Morton Brion Aliment 09/13/2023, 10:07 AM

## 2023-09-13 NOTE — Progress Notes (Signed)
 Patient ID: Henry Bryant, male   DOB: 08/19/1960, 63 y.o.   MRN: [id number] Patient is postoperative day 1 bilateral first ray amputations.  There is no drainage in the wound VAC canisters x 2.  There is not a good suction fit.  At time of discharge will have the wound VAC dressings removed and dry dressings applied.  Discussed with the patient he has single vessel runoff to both feet with minimal arterial inflow.  Patient is high risk of the wounds not healing and requiring additional surgery.

## 2023-09-14 DIAGNOSIS — K611 Rectal abscess: Secondary | ICD-10-CM | POA: Diagnosis not present

## 2023-09-14 LAB — GLUCOSE, CAPILLARY
Glucose-Capillary: 83 mg/dL (ref 70–99)
Glucose-Capillary: 90 mg/dL (ref 70–99)
Glucose-Capillary: 96 mg/dL (ref 70–99)
Glucose-Capillary: 274 mg/dL — ABNORMAL HIGH (ref 70–99)

## 2023-09-14 MED ORDER — MAGNESIUM SULFATE 4 GM/100ML IV SOLN
4.0000 g | Freq: Once | INTRAVENOUS | Status: AC
  Administered 2023-09-14: 4 g via INTRAVENOUS
  Filled 2023-09-14: qty 100

## 2023-09-14 NOTE — Plan of Care (Signed)
  Problem: Elimination: Goal: Will not experience complications related to bowel motility Outcome: Not Progressing Goal: Will not experience complications related to urinary retention Outcome: Not Progressing

## 2023-09-14 NOTE — Progress Notes (Signed)
 [facility]                                                                              Kolter Reaver, is a 63 y.o. male, DOB - [birth year], ZOX:[id number] Admit date - 09/06/2023    Outpatient Primary MD for the patient is Hasanaj, Myra Gianotti, MD  LOS - 8  days  No chief complaint on file.      Brief summary   63 y.o. male with medical history significant of type 2 diabetes mellitus, chronic systolic CHF, hypertension, pulmonary hypertension, A-fib/flutter, recent bilateral toe amputation by podiatry about 4 weeks ago, urinary retention and self-catheterization who comes into the hospital with rectal pain and a purulent discharge in that area.  He tells me he had a fall several days ago and fell on his buttocks, was feeling fine up until 1 to 2 days ago when he started noticing increased pain and just now his seeing discharge. He presented to [facility] emergency room.  Imaging there showed a large, loculated, rim-enhancing fluid collection within the left gluteus and left perirectal tissues measuring 9 x 6 x 7 0.2 x 11 cm in size, also showed emphysematous cystitis. Urinalysis concerning for UTI. He underwent an I&D in the ED, was given antibiotics. He also reports bilateral great toe osteomyelitis status post surgery few weeks back.   09/12/2023: Patient underwent surgery today: AMPUTATION FIRST RAY LEFT FOOT. AMPUTATION FIRST RAY RIGHT FOOT. AMPUTATION SECOND RAY RIGHT FOOT. APPLICATION KERECIS MICRO GRAFT TOTAL OF 38 CM TO COVER BOTH WOUNDS.  LOCAL TISSUE TRANSFER REARRANGEMENT FOR WOUND CLOSURE 10 X 4 CM X 2. APPLICATION OF 13 CM PREVENA WOUND VAC X 2.  09/14/2023: Patient seen.  No new complaints.  Postop day 2.  Orthopedic surgery input is highly appreciated.  Assessment & Plan    Principal Problem: Sepsis due to rectal abscess Klebsiella pneumonia bacteremia -Imaging at [facility] had shown large loculated collection in the left buttocks and perirectal  tissues, underwent I&D there, catheter was placed into the abscess cavity to help facilitate drainage. -CT abdomen 3/3 showed significant improvement in the left perirectal and left buttock region compared to prior exam.  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 -Seen by surgery, Dr. Dwain Sarna, recommended discharge on 10 days of antibiotics and follow-up in [facility] with [facility] surgeon -Continue IV Unasyn, ID following   -Blood cultures at [facility] positive for Klebsiella pneumonia, urine culture here on 3/1 positive for Klebsiella -Pain control increased per patient request, added bowel regimen 09/14/2023: Patient is currently on Augmentin and doxycycline.  Infectious disease input is highly appreciated.  Emphysematous cystitis, Klebsiella UTI -Seen by urology on 3/3, Dr. Lafonda Mosses, recommended continuing Foley catheter -Repeat CT abdomen showed improved emphysematous cystitis -Continue IV Unasyn 09/14/2023: Patient is currently on Augmentin and doxycycline.   Diabetic ulcer of toes, osteomyelitis bilateral first metatarsals -Prior toe amputation at [facility] in 08/2023.   -MRI of the toes showed prior amputation of the great toe with osteomyelitis of the distal half of the first metatarsal on the left and right, no abscess.  - Vascular surgery requested orthopedic opinion.   -Seen by Dr.  Lajoyce Corners, recommended first ray and second toe amputation bilaterally dressing changes to both knees -Surgery timing per orthopedics, Dr. Lajoyce Corners 09/14/2023: Postop day 2.  Orthopedic team is directing care.  Patient seems stable.     Peripheral arterial disease with history of revascularization -Followed by vascular surgery outpatient, last saw Dr. Hetty Blend, underwent lower extremity angiogram with revascularization in 06/2023  -Given poor healing and findings of osteomyelitis, vascular surgery recommended orthopedics consult   Insulin-dependent diabetes mellitus, uncontrolled with with hyperglycemia- -hemoglobin A1c 6.8 on  09/06/2023  CBG (last 3)  Recent Labs    09/13/23 2130 09/14/23 0532 09/14/23 1126  GLUCAP 140* 83 90   -Continue Lantus 30 units at bedtime, SSI moderate     Chronic systolic CHF -euvolemic,  -Continue Toprol-XL, low-dose Entresto, Crestor    Atrial flutter -Continue amiodarone, metoprolol -Eliquis currently on hold in anticipation of surgical intervention  Hypokalemia -K 3.9 today.  Estimated body mass index is 20.09 kg/m as calculated from the following:   Height as of this encounter: 5\' 10"  (1.778 m).   Weight as of this encounter: 63.5 kg.  Code Status: DNR DVT Prophylaxis:  SCD's Start: 09/12/23 1141   Level of Care: Level of care: Telemetry Medical Family Communication: Updated patient Disposition Plan:      Remains inpatient appropriate:      Procedures:  AMPUTATION FIRST RAY LEFT FOOT. AMPUTATION FIRST RAY RIGHT FOOT. AMPUTATION SECOND RAY RIGHT FOOT. APPLICATION KERECIS MICRO GRAFT TOTAL OF 38 CM TO COVER BOTH WOUNDS.  LOCAL TISSUE TRANSFER REARRANGEMENT FOR WOUND CLOSURE 10 X 4 CM X 2. APPLICATION OF 13 CM PREVENA WOUND VAC X 2.  Consultants:   General surgery Urology Vascular surgery Infectious ease  Antimicrobials:   Anti-infectives (From admission, onward)    Start     Dose/Rate Route Frequency Ordered Stop   09/13/23 1300  amoxicillin-clavulanate (AUGMENTIN) 875-125 MG per tablet 1 tablet        1 tablet Oral Every 12 hours 09/12/23 1134 09/16/23 0959   09/12/23 1230  ceFAZolin (ANCEF) IVPB 2g/100 mL premix  Status:  Discontinued        2 g 200 mL/hr over 30 Minutes Intravenous Every 8 hours 09/12/23 1140 09/12/23 1930   09/12/23 0945  vancomycin (VANCOCIN) IVPB 1000 mg/200 mL premix        1,000 mg 200 mL/hr over 60 Minutes Intravenous To Surgery 09/12/23 0933 09/13/23 0945   09/12/23 0935  vancomycin (VANCOCIN) 1-5 GM/200ML-% IVPB       Note to Pharmacy: Isabel Caprice, Destiny: cabinet override      09/12/23 0935 09/12/23 1041   09/12/23  0915  ceFAZolin (ANCEF) IVPB 2g/100 mL premix  Status:  Discontinued        2 g 200 mL/hr over 30 Minutes Intravenous On call to O.R. 09/12/23 0902 09/12/23 1125   09/09/23 1300  Ampicillin-Sulbactam (UNASYN) 3 g in sodium chloride 0.9 % 100 mL IVPB        3 g 200 mL/hr over 30 Minutes Intravenous Every 6 hours 09/09/23 1204 09/13/23 0721   09/09/23 1300  doxycycline (VIBRA-TABS) tablet 100 mg        100 mg Oral Every 12 hours 09/09/23 1204 09/15/23 2359   09/08/23 2000  linezolid (ZYVOX) IVPB 600 mg  Status:  Discontinued        600 mg 300 mL/hr over 60 Minutes Intravenous Every 12 hours 09/08/23 1117 09/09/23 1204   09/07/23 0800  vancomycin (VANCOREADY) IVPB 750 mg/150 mL  Status:  Discontinued        750 mg 150 mL/hr over 60 Minutes Intravenous Every 12 hours 09/06/23 1834 09/08/23 1117   09/06/23 1930  vancomycin (VANCOCIN) IVPB 1000 mg/200 mL premix        1,000 mg 200 mL/hr over 60 Minutes Intravenous STAT 09/06/23 1834 09/06/23 2300   09/06/23 1930  piperacillin-tazobactam (ZOSYN) IVPB 3.375 g  Status:  Discontinued        3.375 g 12.5 mL/hr over 240 Minutes Intravenous Every 8 hours 09/06/23 1834 09/09/23 1204          Medications  ALPRAZolam  0.5 mg Oral QHS   amiodarone  200 mg Oral Daily   amoxicillin-clavulanate  1 tablet Oral Q12H   vitamin C  1,000 mg Oral Daily   Chlorhexidine Gluconate Cloth  6 each Topical Daily   docusate sodium  100 mg Oral BID   doxycycline  100 mg Oral Q12H   enoxaparin (LOVENOX) injection  60 mg Subcutaneous Q12H   insulin aspart  0-15 Units Subcutaneous TID WC   insulin aspart  0-5 Units Subcutaneous QHS   insulin glargine  30 Units Subcutaneous QHS   leptospermum manuka honey  1 Application Topical Daily   metoprolol succinate  25 mg Oral Daily   mupirocin ointment   Nasal BID   nutrition supplement (JUVEN)  1 packet Oral BID BM   pantoprazole  40 mg Oral QHS   pantoprazole  40 mg Oral Daily   polyethylene glycol  17 g Oral Daily    rosuvastatin  40 mg Oral Daily   sacubitril-valsartan  1 tablet Oral BID   tamsulosin  0.4 mg Oral Daily   zinc sulfate (50mg  elemental zinc)  220 mg Oral Daily      Subjective:   Kadeem Hyle was seen and examined today.  Complaining of rectal pain not well-controlled with current pain medications, constipation+ no nausea vomiting, chest pain, shortness of breath, fevers.   Objective:   Vitals:   09/13/23 2320 09/14/23 0300 09/14/23 0800 09/14/23 1123  BP: (!) 118/54 (!) 101/53 (!) 107/39 (!) 116/44  Pulse: 71 74 70 72  Resp: 15 19 19 19   Temp: 97.8 F (36.6 C) 98.3 F (36.8 C)  98.2 F (36.8 C)  TempSrc: Oral Oral  Oral  SpO2: 99% 96% 99% 100%  Weight:      Height:        Intake/Output Summary (Last 24 hours) at 09/14/2023 1548 Last data filed at 09/14/2023 1000 Gross per 24 hour  Intake --  Output 2000 ml  Net -2000 ml     Wt Readings from Last 3 Encounters:  09/12/23 63.5 kg  07/07/23 72.6 kg  05/09/23 78 kg    Physical Exam General: Awake and alert.  Not in any distress.   Cardiovascular: S1 S2 clear.  Respiratory: Clear to auscultation. Gastrointestinal: Soft and nontender. Ext: Amputation of right first and second toes, and amputation of first left today, 09/12/2023. Neuro: Patient moves all extremities.    GU: Foley+    Data Reviewed:  I have personally reviewed following labs    CBC Lab Results  Component Value Date   WBC 13.0 (H) 09/13/2023   RBC 3.70 (L) 09/13/2023   HGB 10.6 (L) 09/13/2023   HCT 33.8 (L) 09/13/2023   MCV 91.4 09/13/2023   MCH 28.6 09/13/2023   PLT 443 (H) 09/13/2023   MCHC 31.4 09/13/2023   RDW 16.2 (H) 09/13/2023   LYMPHSABS 1.9 09/13/2023   MONOABS  0.6 09/13/2023   EOSABS 0.1 09/13/2023   BASOSABS 0.0 09/13/2023     Last metabolic panel Lab Results  Component Value Date   NA 140 09/13/2023   K 3.9 09/13/2023   CL 109 09/13/2023   CO2 22 09/13/2023   BUN 15 09/13/2023   CREATININE 0.87 09/13/2023    GLUCOSE 95 09/13/2023   GFRNONAA >60 09/13/2023   CALCIUM 8.2 (L) 09/13/2023   PHOS 3.3 09/13/2023   PROT 5.6 (L) 09/09/2023   ALBUMIN 2.4 (L) 09/13/2023   BILITOT 0.4 09/09/2023   ALKPHOS 65 09/09/2023   AST 13 (L) 09/09/2023   ALT 17 09/09/2023   ANIONGAP 9 09/13/2023    CBG (last 3)  Recent Labs    09/13/23 2130 09/14/23 0532 09/14/23 1126  GLUCAP 140* 83 90      Coagulation Profile: No results for input(s): "INR", "PROTIME" in the last 168 hours.   Radiology Studies: I have personally reviewed the imaging studies  No results found.    Time spent: 35 minutes.  Barnetta Chapel M.D. [facility] 09/14/2023, 3:48 PM  Available via Epic secure chat 7am-7pm After 7 pm, please refer to night coverage provider listed on amion.

## 2023-09-14 NOTE — Evaluation (Signed)
 Occupational Therapy Evaluation Patient Details Name: Henry Bryant MRN: [id number] DOB: 05-03-1961 Today's Date: 09/14/2023   History of Present Illness   63 y.o. male presents to [facility] 09/06/23 from [facility] with rectal pain and purulent discharge. Hx of prior fall several days prior. Pt w/ sepsis 2/2 rectal abscess and emphysematous cystitis, s/p I&D at [facility]. Pt also has osteomyelitis of B first metatarsals with ulcers of first metatarsal head B. 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 PMHx: T2DM, chronic systolic CHF, hypertension, pulmonary hypertension, A-fib/flutter, recent bilateral toe amputation by podiatry about 4 weeks ago     Clinical Impressions Prior to this admission, patient was living with his spouse (who is currently in skilled rehab) and using a RW for mobility. Patient endorses multiple falls, having to call EMS 5x in one week due to decreased balance. Patient had acquired a power wheelchair but has not used it yet. Currently, patient presenting with STM deficits, decreased strength and activity tolerance, and pain at bilateral feet, at his abscess site, and with the foley catheter. OT coming back x2 for evaluation, with patient agreeable to EOB. Patient requiring mod A to attempt to sit EOB, however buckling with pain at abscess site and refusing to attempt further. Patient is mod A for ADLs at this time. Patient is now agreeable to rehab at a lesser intensive setting (< 3 hours), with OT recommendation in agreement. OT will continue to follow acutely.      If plan is discharge home, recommend the following:   Two people to help with walking and/or transfers;A lot of help with bathing/dressing/bathroom;Direct supervision/assist for medications management;Direct supervision/assist for financial management;Assist for transportation;Help with stairs or ramp for entrance;Supervision due to cognitive status     Functional Status  Assessment   Patient has had a recent decline in their functional status and demonstrates the ability to make significant improvements in function in a reasonable and predictable amount of time.     Equipment Recommendations   Other (comment) (will continue to assess)     Recommendations for Other Services         Precautions/Restrictions   Precautions Precautions: Fall Recall of Precautions/Restrictions: Intact Precaution/Restrictions Comments: x2 wound vacs, catheter Required Braces or Orthoses: Other Brace Other Brace: B post op shoes (pt refused to accept post-op shoes from vendor, not in room) Restrictions Weight Bearing Restrictions Per Provider Order: Yes RLE Weight Bearing Per Provider Order:  (Heel WB) LLE Weight Bearing Per Provider Order:  (Heel WB) Other Position/Activity Restrictions: WB on heels only for transfers, no gait training     Mobility Bed Mobility Overal bed mobility: Needs Assistance Bed Mobility: Rolling, Sidelying to Sit Rolling: Contact guard assist, Used rails Sidelying to sit: Mod assist       General bed mobility comments: pt able to roll to the left with CGA for safety and use of bed rail. Patient able to bring BLEs off of bed, using OTs hand to pull up into sitting at Mod A, but then reporting increased pain and never fully sitting EOB    Transfers                   General transfer comment: pt declined      Balance  ADL either performed or assessed with clinical judgement   ADL Overall ADL's : Needs assistance/impaired Eating/Feeding: Set up;Sitting   Grooming: Set up;Sitting   Upper Body Bathing: Minimal assistance;Sitting   Lower Body Bathing: Maximal assistance;Sitting/lateral leans;Sit to/from stand   Upper Body Dressing : Minimal assistance;Sitting   Lower Body Dressing: Total assistance;Bed level   Toilet Transfer: Moderate assistance;+2 for  physical assistance;+2 for safety/equipment Toilet Transfer Details (indicate cue type and reason): unable to progress to EOB due to pain this session Toileting- Clothing Manipulation and Hygiene: Total assistance;Bed level Toileting - Clothing Manipulation Details (indicate cue type and reason): rolling, foley in place     Functional mobility during ADLs: Moderate assistance;+2 for physical assistance;+2 for safety/equipment;Cueing for safety;Cueing for sequencing General ADL Comments: Prior to this admission, patient was living with his spouse (who is currently  in skilled rehab) and using a RW for mobility. Patient endorses multiple falls, having to call EMS 5x in one week due to decreased balance. Patient had acquired a power wheelchair but has not used it yet. Currently, patient presenting with STM deficits, decreased strength and activity tolerance, and pain at bilateral feet, at his abscess site, and with the foley catheter. OT coming back x2 for evaluation, with patient agreeable to EOB. Patient requiring mod A to attempt to sit EOB, however buckling with pain at abscess site and refusing to attempt further. Patient is mod A for ADLs at this time. Patient is now agreeable to rehab at a lesser intensive setting (< 3 hours), with OT recommendation in agreement. OT will continue to follow acutely.     Vision   Vision Assessment?: No apparent visual deficits Additional Comments: will continue to assess     Perception Perception: Not tested       Praxis Praxis: Not tested       Pertinent Vitals/Pain Pain Assessment Pain Assessment: Faces Faces Pain Scale: Hurts whole lot Pain Location: rectum, B feet Pain Descriptors / Indicators: Discomfort, Aching Pain Intervention(s): Limited activity within patient's tolerance, Monitored during session, Repositioned     Extremity/Trunk Assessment Upper Extremity Assessment Upper Extremity Assessment: Generalized weakness   Lower Extremity  Assessment Lower Extremity Assessment: Defer to PT evaluation RLE Sensation:  (on dorsum, plantar surface, and digits) LLE Sensation:  (dorsum of foot, plantar surface, digits)   Cervical / Trunk Assessment Cervical / Trunk Assessment: Normal   Communication Communication Communication: No apparent difficulties   Cognition Arousal: Alert Behavior During Therapy: WFL for tasks assessed/performed Cognition: Cognition impaired     Awareness: Intellectual awareness intact Memory impairment (select all impairments): Short-term memory Attention impairment (select first level of impairment): Selective attention Executive functioning impairment (select all impairments): Sequencing, Reasoning, Problem solving OT - Cognition Comments: Patient with STM deficits, and decreased ability to motor plan and sequence. Told PT that he was using his power wheelchair, told OT that he hasnt used it yet                 Following commands: Intact       Cueing  General Comments   Cueing Techniques: Verbal cues      Exercises     Shoulder Instructions      Home Living Family/patient expects to be discharged to:: Private residence Living Arrangements: Children (daughter) Available Help at Discharge: Family;Available 24 hours/day Type of Home: House Home Access: Ramped entrance     Home Layout: One level     Bathroom Shower/Tub: Sponge bathes at baseline;Tub/shower unit   Bathroom  Toilet: Handicapped height Bathroom Accessibility: Yes   Home Equipment: Wheelchair - power;Grab bars - tub/shower;Grab bars - toilet;Shower seat;BSC/3in1;Rolling Environmental consultant (2 wheels);Cane - single point;Rollator (4 wheels)   Additional Comments: will have home care 2-3 hours 3-4 times a week. Daughter is Charity fundraiser and will be there 24/7 as needed      Prior Functioning/Environment Prior Level of Function : Independent/Modified Independent             Mobility Comments: walked with a walker, fell multiple  times a week, acquired a power wheelchair, never used yet ADLs Comments: all ADLs have been progressively becoming harder    OT Problem List: Decreased strength;Decreased range of motion;Decreased activity tolerance;Impaired balance (sitting and/or standing);Decreased coordination;Decreased cognition;Decreased safety awareness;Decreased knowledge of use of DME or AE;Decreased knowledge of precautions;Impaired sensation;Pain   OT Treatment/Interventions: Self-care/ADL training;Therapeutic exercise;Energy conservation;DME and/or AE instruction;Manual therapy;Therapeutic activities;Cognitive remediation/compensation;Patient/family education;Balance training      OT Goals(Current goals can be found in the care plan section)   Acute Rehab OT Goals Patient Stated Goal: to get better OT Goal Formulation: With patient Time For Goal Achievement: 09/28/23 Potential to Achieve Goals: Good ADL Goals Pt Will Perform Lower Body Bathing: sitting/lateral leans;sit to/from stand;with mod assist Pt Will Perform Lower Body Dressing: with mod assist;sitting/lateral leans;sit to/from stand Pt Will Transfer to Toilet: with mod assist;stand pivot transfer;bedside commode Pt Will Perform Toileting - Clothing Manipulation and hygiene: with mod assist;sitting/lateral leans;sit to/from stand Additional ADL Goal #1: Patient will be able to perform bed mobility at min A level as a precursor to OOB activities.   OT Frequency:  Min 2X/week    Co-evaluation              AM-PAC OT "6 Clicks" Daily Activity     Outcome Measure Help from another person eating meals?: A Little Help from another person taking care of personal grooming?: A Little Help from another person toileting, which includes using toliet, bedpan, or urinal?: A Lot Help from another person bathing (including washing, rinsing, drying)?: A Lot Help from another person to put on and taking off regular upper body clothing?: A Little Help from  another person to put on and taking off regular lower body clothing?: A Lot 6 Click Score: 15   End of Session Nurse Communication: Mobility status  Activity Tolerance: Patient limited by fatigue;Patient limited by pain Patient left: in bed;with call bell/phone within reach;with bed alarm set  OT Visit Diagnosis: Unsteadiness on feet (R26.81);Other abnormalities of gait and mobility (R26.89);Repeated falls (R29.6);Muscle weakness (generalized) (M62.81);Other symptoms and signs involving cognitive function;Pain                Time: 6045-4098 OT Time Calculation (min): 21 min Charges:  OT General Charges $OT Visit: 1 Visit OT Evaluation $OT Eval Moderate Complexity: 1 Mod  Pollyann Glen E. Jojo Pehl, OTR/L Acute Rehabilitation Services [phone]   Cherlyn Cushing 09/14/2023, 3:05 PM

## 2023-09-14 NOTE — Progress Notes (Signed)
 Patient refuses foley care. He has been educated both last night and tonight of the necessity and risk for infection.

## 2023-09-15 ENCOUNTER — Encounter (HOSPITAL_COMMUNITY): Payer: Self-pay | Admitting: Orthopedic Surgery

## 2023-09-15 DIAGNOSIS — K611 Rectal abscess: Secondary | ICD-10-CM | POA: Diagnosis not present

## 2023-09-15 LAB — GLUCOSE, CAPILLARY
Glucose-Capillary: 81 mg/dL (ref 70–99)
Glucose-Capillary: 103 mg/dL — ABNORMAL HIGH (ref 70–99)
Glucose-Capillary: 126 mg/dL — ABNORMAL HIGH (ref 70–99)
Glucose-Capillary: 131 mg/dL — ABNORMAL HIGH (ref 70–99)

## 2023-09-15 MED ORDER — OXYCODONE HCL 5 MG PO TABS
5.0000 mg | ORAL_TABLET | ORAL | Status: DC | PRN
  Administered 2023-09-15 – 2023-09-19 (×10): 10 mg via ORAL
  Administered 2023-09-20: 5 mg via ORAL
  Administered 2023-09-21 – 2023-09-22 (×4): 10 mg via ORAL
  Filled 2023-09-15 (×15): qty 2

## 2023-09-15 NOTE — Progress Notes (Signed)
 Henry Bryant                                                                              Righteous Claiborne, is a 63 y.o. male, DOB - Nov 14, 1960, GNF:[id number] Admit date - 09/06/2023    Outpatient Primary MD for the patient is Hasanaj, Myra Gianotti, MD  LOS - 9  days  No chief complaint on file.      Brief summary   63 y.o. male with medical history significant of type 2 diabetes mellitus, chronic systolic CHF, hypertension, pulmonary hypertension, A-fib/flutter, recent bilateral toe amputation by podiatry about 4 weeks ago, urinary retention and self-catheterization who comes into the hospital with rectal pain and a purulent discharge in that area.  He tells me he had a fall several days ago and fell on his buttocks, was feeling fine up until 1 to 2 days ago when he started noticing increased pain and just now his seeing discharge. He presented to [facility] emergency room.  Imaging there showed a large, loculated, rim-enhancing fluid collection within the left gluteus and left perirectal tissues measuring 9 x 6 x 7 0.2 x 11 cm in size, also showed emphysematous cystitis. Urinalysis concerning for UTI. He underwent an I&D in the ED, was given antibiotics. He also reports bilateral great toe osteomyelitis status post surgery few weeks back.   09/12/2023: Patient underwent surgery today: AMPUTATION FIRST RAY LEFT FOOT. AMPUTATION FIRST RAY RIGHT FOOT. AMPUTATION SECOND RAY RIGHT FOOT. APPLICATION KERECIS MICRO GRAFT TOTAL OF 38 CM TO COVER BOTH WOUNDS.  LOCAL TISSUE TRANSFER REARRANGEMENT FOR WOUND CLOSURE 10 X 4 CM X 2. APPLICATION OF 13 CM PREVENA WOUND VAC X 2.  09/15/2023: Patient seen.  No new complaints.  Postop day 3.  Orthopedic surgery input is highly appreciated.   Assessment & Plan    Principal Problem: Sepsis due to rectal abscess Klebsiella pneumonia bacteremia -Imaging at [facility] had shown large loculated collection in the left buttocks and perirectal  tissues, underwent I&D there, catheter was placed into the abscess cavity to help facilitate drainage. -CT abdomen 3/3 showed significant improvement in the left perirectal and left buttock region compared to prior exam.  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 -Seen by surgery, Dr. Dwain Sarna, recommended discharge on 10 days of antibiotics and follow-up in [facility] with [facility] surgeon -Continue IV Unasyn, ID following   -Blood cultures at [facility] positive for Klebsiella pneumonia, urine culture here on 3/1 positive for Klebsiella -Pain control increased per patient request, added bowel regimen 09/15/2023: Patient is currently on Augmentin and doxycycline.  Infectious disease input is highly appreciated.  Emphysematous cystitis, Klebsiella UTI -Seen by urology on 3/3, Dr. Lafonda Mosses, recommended continuing Foley catheter -Repeat CT abdomen showed improved emphysematous cystitis -Continue IV Unasyn 09/15/2023: Patient is currently on Augmentin and doxycycline.   Diabetic ulcer of toes, osteomyelitis bilateral first metatarsals -Prior toe amputation at [facility] -R in 08/2023.   -MRI of the toes showed prior amputation of the great toe with osteomyelitis of the distal half of the first metatarsal on the left and right, no abscess.  - Vascular surgery requested orthopedic opinion.   -Seen by  Dr. Lajoyce Corners, recommended first ray and second toe amputation bilaterally dressing changes to both knees -Surgery timing per orthopedics, Dr. Lajoyce Corners 09/15/2023: Postop day 3.  Orthopedic team is directing care.  Patient seems stable.     Peripheral arterial disease with history of revascularization -Followed by vascular surgery outpatient, last saw Dr. Hetty Blend, underwent lower extremity angiogram with revascularization in 06/2023  -Given poor healing and findings of osteomyelitis, vascular surgery recommended orthopedics consult   Insulin-dependent diabetes mellitus, uncontrolled with with hyperglycemia- -hemoglobin A1c 6.8  on 09/06/2023  CBG (last 3)  Recent Labs    09/15/23 0551 09/15/23 1215 09/15/23 1633  GLUCAP 81 103* 126*   -Continue Lantus 30 units at bedtime, SSI moderate     Chronic systolic CHF -euvolemic,  -Continue Toprol-XL, low-dose Entresto, Crestor    Atrial flutter -Continue amiodarone, metoprolol -Eliquis currently on hold in anticipation of surgical intervention  Hypokalemia: -Last potassium of 3.9.    Estimated body mass index is 20.09 kg/m as calculated from the following:   Height as of this encounter: 5\' 10"  (1.778 m).   Weight as of this encounter: 63.5 kg.  Code Status: DNR DVT Prophylaxis:  SCD's Start: 09/12/23 1141   Level of Care: Level of care: Telemetry Medical Family Communication: Updated patient Disposition Plan:      Remains inpatient appropriate:      Procedures:  AMPUTATION FIRST RAY LEFT FOOT. AMPUTATION FIRST RAY RIGHT FOOT. AMPUTATION SECOND RAY RIGHT FOOT. APPLICATION KERECIS MICRO GRAFT TOTAL OF 38 CM TO COVER BOTH WOUNDS.  LOCAL TISSUE TRANSFER REARRANGEMENT FOR WOUND CLOSURE 10 X 4 CM X 2. APPLICATION OF 13 CM PREVENA WOUND VAC X 2.  Consultants:   General surgery Urology Vascular surgery Infectious ease  Antimicrobials:   Anti-infectives (From admission, onward)    Start     Dose/Rate Route Frequency Ordered Stop   09/13/23 1300  amoxicillin-clavulanate (AUGMENTIN) 875-125 MG per tablet 1 tablet        1 tablet Oral Every 12 hours 09/12/23 1134 09/16/23 0959   09/12/23 1230  ceFAZolin (ANCEF) IVPB 2g/100 mL premix  Status:  Discontinued        2 g 200 mL/hr over 30 Minutes Intravenous Every 8 hours 09/12/23 1140 09/12/23 1930   09/12/23 0945  vancomycin (VANCOCIN) IVPB 1000 mg/200 mL premix        1,000 mg 200 mL/hr over 60 Minutes Intravenous To Surgery 09/12/23 0933 09/13/23 0945   09/12/23 0935  vancomycin (VANCOCIN) 1-5 GM/200ML-% IVPB       Note to Pharmacy: Isabel Caprice, Destiny: cabinet override      09/12/23 0935 09/12/23  1041   09/12/23 0915  ceFAZolin (ANCEF) IVPB 2g/100 mL premix  Status:  Discontinued        2 g 200 mL/hr over 30 Minutes Intravenous On call to O.R. 09/12/23 0902 09/12/23 1125   09/09/23 1300  Ampicillin-Sulbactam (UNASYN) 3 g in sodium chloride 0.9 % 100 mL IVPB        3 g 200 mL/hr over 30 Minutes Intravenous Every 6 hours 09/09/23 1204 09/13/23 0721   09/09/23 1300  doxycycline (VIBRA-TABS) tablet 100 mg        100 mg Oral Every 12 hours 09/09/23 1204 09/15/23 2359   09/08/23 2000  linezolid (ZYVOX) IVPB 600 mg  Status:  Discontinued        600 mg 300 mL/hr over 60 Minutes Intravenous Every 12 hours 09/08/23 1117 09/09/23 1204   09/07/23 0800  vancomycin (VANCOREADY) IVPB 750  mg/150 mL  Status:  Discontinued        750 mg 150 mL/hr over 60 Minutes Intravenous Every 12 hours 09/06/23 1834 09/08/23 1117   09/06/23 1930  vancomycin (VANCOCIN) IVPB 1000 mg/200 mL premix        1,000 mg 200 mL/hr over 60 Minutes Intravenous STAT 09/06/23 1834 09/06/23 2300   09/06/23 1930  piperacillin-tazobactam (ZOSYN) IVPB 3.375 g  Status:  Discontinued        3.375 g 12.5 mL/hr over 240 Minutes Intravenous Every 8 hours 09/06/23 1834 09/09/23 1204          Medications  ALPRAZolam  0.5 mg Oral QHS   amiodarone  200 mg Oral Daily   amoxicillin-clavulanate  1 tablet Oral Q12H   vitamin C  1,000 mg Oral Daily   Chlorhexidine Gluconate Cloth  6 each Topical Daily   docusate sodium  100 mg Oral BID   doxycycline  100 mg Oral Q12H   enoxaparin (LOVENOX) injection  60 mg Subcutaneous Q12H   insulin aspart  0-15 Units Subcutaneous TID WC   insulin aspart  0-5 Units Subcutaneous QHS   insulin glargine  30 Units Subcutaneous QHS   leptospermum manuka honey  1 Application Topical Daily   metoprolol succinate  25 mg Oral Daily   mupirocin ointment   Nasal BID   nutrition supplement (JUVEN)  1 packet Oral BID BM   pantoprazole  40 mg Oral QHS   pantoprazole  40 mg Oral Daily   polyethylene glycol   17 g Oral Daily   rosuvastatin  40 mg Oral Daily   sacubitril-valsartan  1 tablet Oral BID   tamsulosin  0.4 mg Oral Daily   zinc sulfate (50mg  elemental zinc)  220 mg Oral Daily      Subjective:   Easten Maceachern was seen and examined today.  Complaining of rectal pain not well-controlled with current pain medications, constipation+ no nausea vomiting, chest pain, shortness of breath, fevers.   Objective:   Vitals:   09/14/23 2338 09/15/23 0352 09/15/23 0913 09/15/23 1216  BP: (!) 90/30 (!) 104/46 (!) 101/33 (!) 88/50  Pulse: 65 66 65 61  Resp: 20 (!) 24 14 13   Temp: 98.9 F (37.2 C) 98 F (36.7 C) 97.8 F (36.6 C)   TempSrc: Oral Oral Oral   SpO2: 97% 99% 99% 100%  Weight:      Height:        Intake/Output Summary (Last 24 hours) at 09/15/2023 1714 Last data filed at 09/15/2023 0354 Gross per 24 hour  Intake 0 ml  Output 875 ml  Net -875 ml     Wt Readings from Last 3 Encounters:  09/12/23 63.5 kg  07/07/23 72.6 kg  05/09/23 78 kg    Physical Exam General: Awake and alert.  Not in any distress.   Cardiovascular: S1 S2 clear.  Respiratory: Clear to auscultation. Gastrointestinal: Soft and nontender. Ext: Amputation of right first and second toes, and amputation of first left today, 09/12/2023. Neuro: Patient moves all extremities.    GU: Foley+    Data Reviewed:  I have personally reviewed following labs    CBC Lab Results  Component Value Date   WBC 13.0 (H) 09/13/2023   RBC 3.70 (L) 09/13/2023   HGB 10.6 (L) 09/13/2023   HCT 33.8 (L) 09/13/2023   MCV 91.4 09/13/2023   MCH 28.6 09/13/2023   PLT 443 (H) 09/13/2023   MCHC 31.4 09/13/2023   RDW 16.2 (H) 09/13/2023  LYMPHSABS 1.9 09/13/2023   MONOABS 0.6 09/13/2023   EOSABS 0.1 09/13/2023   BASOSABS 0.0 09/13/2023     Last metabolic panel Lab Results  Component Value Date   NA 140 09/13/2023   K 3.9 09/13/2023   CL 109 09/13/2023   CO2 22 09/13/2023   BUN 15 09/13/2023   CREATININE 0.87  09/13/2023   GLUCOSE 95 09/13/2023   GFRNONAA >60 09/13/2023   CALCIUM 8.2 (L) 09/13/2023   PHOS 3.3 09/13/2023   PROT 5.6 (L) 09/09/2023   ALBUMIN 2.4 (L) 09/13/2023   BILITOT 0.4 09/09/2023   ALKPHOS 65 09/09/2023   AST 13 (L) 09/09/2023   ALT 17 09/09/2023   ANIONGAP 9 09/13/2023    CBG (last 3)  Recent Labs    09/15/23 0551 09/15/23 1215 09/15/23 1633  GLUCAP 81 103* 126*      Coagulation Profile: No results for input(s): "INR", "PROTIME" in the last 168 hours.   Radiology Studies: I have personally reviewed the imaging studies  No results found.    Time spent: 35 minutes.  Barnetta Chapel M.D. Henry Bryant 09/15/2023, 5:14 PM  Available via Epic secure chat 7am-7pm After 7 pm, please refer to night coverage provider listed on amion.

## 2023-09-15 NOTE — Progress Notes (Signed)
 PT Cancellation Note  Patient Details Name: Henry Bryant MRN: [id number] DOB: Nov 18, 1960   Cancelled Treatment:    Reason Eval/Treat Not Completed: (P) Other (comment), pt stating he was just able to get pain under control and feeling "loopy" for medication, asking this PTA to return in AM. Will check back as schedule allows to continue with PT POC.  Lenora Boys. PTA Acute Rehabilitation Services Office: [phone]    Catalina Antigua 09/15/2023, 3:52 PM

## 2023-09-15 NOTE — Plan of Care (Signed)
   Problem: Education: Goal: Knowledge of General Education information will improve Description: Including pain rating scale, medication(s)/side effects and non-pharmacologic comfort measures Outcome: Progressing   Problem: Clinical Measurements: Goal: Ability to maintain clinical measurements within normal limits will improve Outcome: Progressing Goal: Will remain free from infection Outcome: Progressing

## 2023-09-15 NOTE — Progress Notes (Signed)
 Patient complaining of 10/10 pain in his rectum/buttocks.  Was provided with 10mg  of Oxycodone at 1700.  At 17:30, patient was rechecked and he states that he is still having 10/10 pain.  Attempt was made to reposition patient but patient refused.  At 18:00, patient was rechecked and he states that he is still experiencing 10/10 pain.  MD made aware.

## 2023-09-16 DIAGNOSIS — K611 Rectal abscess: Secondary | ICD-10-CM | POA: Diagnosis not present

## 2023-09-16 LAB — GLUCOSE, CAPILLARY
Glucose-Capillary: 68 mg/dL — ABNORMAL LOW (ref 70–99)
Glucose-Capillary: 91 mg/dL (ref 70–99)
Glucose-Capillary: 156 mg/dL — ABNORMAL HIGH (ref 70–99)
Glucose-Capillary: 162 mg/dL — ABNORMAL HIGH (ref 70–99)
Glucose-Capillary: 135 mg/dL — ABNORMAL HIGH (ref 70–99)

## 2023-09-16 MED ORDER — POLYETHYLENE GLYCOL 3350 17 G PO PACK
17.0000 g | PACK | Freq: Every day | ORAL | Status: DC
  Administered 2023-09-17 – 2023-09-18 (×2): 17 g via ORAL
  Filled 2023-09-16 (×2): qty 1

## 2023-09-16 MED ORDER — BISACODYL 10 MG RE SUPP: 10.0000 mg | Freq: Every day | RECTAL | Status: DC | PRN

## 2023-09-16 MED ORDER — SENNOSIDES-DOCUSATE SODIUM 8.6-50 MG PO TABS
1.0000 | ORAL_TABLET | Freq: Two times a day (BID) | ORAL | Status: DC
  Administered 2023-09-16 – 2023-09-23 (×15): 1 via ORAL
  Filled 2023-09-16 (×15): qty 1

## 2023-09-16 NOTE — Progress Notes (Signed)
 [facility]                                                                              Henry Bryant, is a 63 y.o. male, DOB - Oct 21, 1960, [id number] Admit date - 09/06/2023    Outpatient Primary MD for the patient is Hasanaj, Myra Gianotti, MD  LOS - 10  days  No chief complaint on file.      Brief summary  Patient is a 63 year old male, past medical history significant for type 2 diabetes mellitus, chronic systolic CHF, hypertension, pulmonary hypertension, A-fib/flutter, recent bilateral toe amputation by podiatry about 4 weeks ago, urinary retention and self-catheterization.  Patient presented to [facility] with rectal pain and purulent discharge, for which patient underwent incision and drainage.  Apparently, imaging done at [facility] emergency room revealed a large, loculated, rim-enhancing fluid collection within the left gluteus and left perirectal tissues measuring 9 x 6 x 7 0.2 x 11 cm in size.  Surgery team at [facility] reviewed patient today, 09/16/2023 (see documentation).  CT scan done at [facility] also revealed emphysematous cystitis. Urinalysis was concerning for UTI.  Patient was transferred to this hospital for further management of bilateral great toe osteomyelitis.  Patient underwent surgery on 09/12/2023.  09/12/2023: Patient underwent surgery: AMPUTATION FIRST RAY LEFT FOOT. AMPUTATION FIRST RAY RIGHT FOOT. AMPUTATION SECOND RAY RIGHT FOOT. APPLICATION KERECIS MICRO GRAFT TOTAL OF 38 CM TO COVER BOTH WOUNDS.  LOCAL TISSUE TRANSFER REARRANGEMENT FOR WOUND CLOSURE 10 X 4 CM X 2. APPLICATION OF 13 CM PREVENA WOUND VAC X 2.  3/11//2025: Patient seen.  No new complaints.  Postop day 4.  Orthopedic surgery input is highly appreciated.  General surgery input is also appreciated (rectal abscess management)  Assessment & Plan  Sepsis due to rectal abscess Klebsiella pneumonia bacteremia -Imaging at [facility] had shown large loculated  collection in the left buttocks and perirectal tissues, underwent I&D there, catheter was placed into the abscess cavity to help facilitate drainage. -CT abdomen 09/08/2023 revealed significant improvement in the left perirectal and left buttock region compared to prior exam.  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 -Seen by surgery, Dr. Dwain Sarna, recommended discharge on 10 days of antibiotics and follow-up in [facility] with [facility] -Patient was on IV Unasyn, and eventually transition to oral Augmentin and doxycycline.  Patient has completed antibiotics.   -Blood cultures at [facility] positive for Klebsiella pneumonia, urine culture here on 3/1 positive for Klebsiella  Emphysematous cystitis, Klebsiella UTI -Seen by urology on 09/08/2023, Dr. Lafonda Mosses.  Urology team recommended continuing Foley catheter -Repeat CT abdomen showed improved emphysematous cystitis -Patient has completed course of antibiotics   Diabetic ulcer of toes, osteomyelitis bilateral first metatarsals -Prior toe amputation at [facility] -R in 08/2023.   -MRI of the toes showed prior amputation of the great toe with osteomyelitis of the distal half of the first metatarsal on the left and right, no abscess.  - Vascular surgery requested orthopedic opinion.   -Seen by Dr. Lajoyce Corners, recommended first ray and second toe amputation bilaterally, and dressing changes to both knees -Patient underwent surgery on 09/12/2023 (please see above).  Peripheral arterial disease with history of revascularization -Followed by vascular surgery outpatient, last saw Dr. Hetty Blend, underwent lower extremity angiogram with revascularization in 06/2023  -Given poor healing and findings of osteomyelitis, vascular surgery recommended orthopedics consult   Insulin-dependent diabetes mellitus, uncontrolled with with hyperglycemia- -hemoglobin A1c 6.8 on 09/06/2023  CBG (last 3)  Recent Labs    09/16/23 0604 09/16/23 0635 09/16/23 1119  GLUCAP 68* 91 156*    -Continue Lantus 30 units at bedtime, SSI moderate     Chronic systolic CHF -euvolemic,  -Continue Toprol-XL, low-dose Entresto, Crestor    Atrial flutter -Continue amiodarone, metoprolol -Eliquis currently on hold in anticipation of surgical intervention  Hypokalemia: -Last potassium of 3.9.    Estimated body mass index is 20.09 kg/m as calculated from the following:   Height as of this encounter: 5\' 10"  (1.778 m).   Weight as of this encounter: 63.5 kg.  Code Status: DNR DVT Prophylaxis:  SCD's Start: 09/12/23 1141   Level of Care: Level of care: Telemetry Medical Family Communication: Updated patient Disposition Plan:      Remains inpatient appropriate:      Procedures:  AMPUTATION FIRST RAY LEFT FOOT. AMPUTATION FIRST RAY RIGHT FOOT. AMPUTATION SECOND RAY RIGHT FOOT. APPLICATION KERECIS MICRO GRAFT TOTAL OF 38 CM TO COVER BOTH WOUNDS.  LOCAL TISSUE TRANSFER REARRANGEMENT FOR WOUND CLOSURE 10 X 4 CM X 2. APPLICATION OF 13 CM PREVENA WOUND VAC X 2.  Consultants:   General surgery Urology Vascular surgery Infectious ease  Antimicrobials:   Anti-infectives (From admission, onward)    Start     Dose/Rate Route Frequency Ordered Stop   09/13/23 1300  amoxicillin-clavulanate (AUGMENTIN) 875-125 MG per tablet 1 tablet        1 tablet Oral Every 12 hours 09/12/23 1134 09/15/23 2129   09/12/23 1230  ceFAZolin (ANCEF) IVPB 2g/100 mL premix  Status:  Discontinued        2 g 200 mL/hr over 30 Minutes Intravenous Every 8 hours 09/12/23 1140 09/12/23 1930   09/12/23 0945  vancomycin (VANCOCIN) IVPB 1000 mg/200 mL premix        1,000 mg 200 mL/hr over 60 Minutes Intravenous To Surgery 09/12/23 0933 09/13/23 0945   09/12/23 0935  vancomycin (VANCOCIN) 1-5 GM/200ML-% IVPB       Note to Pharmacy: Isabel Caprice, Destiny: cabinet override      09/12/23 0935 09/12/23 1041   09/12/23 0915  ceFAZolin (ANCEF) IVPB 2g/100 mL premix  Status:  Discontinued        2 g 200 mL/hr over  30 Minutes Intravenous On call to O.R. 09/12/23 0902 09/12/23 1125   09/09/23 1300  Ampicillin-Sulbactam (UNASYN) 3 g in sodium chloride 0.9 % 100 mL IVPB        3 g 200 mL/hr over 30 Minutes Intravenous Every 6 hours 09/09/23 1204 09/13/23 0721   09/09/23 1300  doxycycline (VIBRA-TABS) tablet 100 mg        100 mg Oral Every 12 hours 09/09/23 1204 09/15/23 2129   09/08/23 2000  linezolid (ZYVOX) IVPB 600 mg  Status:  Discontinued        600 mg 300 mL/hr over 60 Minutes Intravenous Every 12 hours 09/08/23 1117 09/09/23 1204   09/07/23 0800  vancomycin (VANCOREADY) IVPB 750 mg/150 mL  Status:  Discontinued        750 mg 150 mL/hr over 60 Minutes Intravenous Every 12 hours 09/06/23 1834 09/08/23 1117   09/06/23 1930  vancomycin (VANCOCIN) IVPB 1000 mg/200 mL  premix        1,000 mg 200 mL/hr over 60 Minutes Intravenous STAT 09/06/23 1834 09/06/23 2300   09/06/23 1930  piperacillin-tazobactam (ZOSYN) IVPB 3.375 g  Status:  Discontinued        3.375 g 12.5 mL/hr over 240 Minutes Intravenous Every 8 hours 09/06/23 1834 09/09/23 1204          Medications  ALPRAZolam  0.5 mg Oral QHS   amiodarone  200 mg Oral Daily   vitamin C  1,000 mg Oral Daily   Chlorhexidine Gluconate Cloth  6 each Topical Daily   docusate sodium  100 mg Oral BID   enoxaparin (LOVENOX) injection  60 mg Subcutaneous Q12H   insulin aspart  0-15 Units Subcutaneous TID WC   insulin aspart  0-5 Units Subcutaneous QHS   insulin glargine  30 Units Subcutaneous QHS   leptospermum manuka honey  1 Application Topical Daily   metoprolol succinate  25 mg Oral Daily   mupirocin ointment   Nasal BID   nutrition supplement (JUVEN)  1 packet Oral BID BM   pantoprazole  40 mg Oral QHS   pantoprazole  40 mg Oral Daily   polyethylene glycol  17 g Oral Daily   rosuvastatin  40 mg Oral Daily   sacubitril-valsartan  1 tablet Oral BID   senna-docusate  1 tablet Oral BID   tamsulosin  0.4 mg Oral Daily   zinc sulfate (50mg   elemental zinc)  220 mg Oral Daily      Subjective:  No new complaints.  Objective:   Vitals:   09/16/23 0615 09/16/23 0918 09/16/23 1055 09/16/23 1121  BP: (!) 104/25 (!) 107/50  (!) 141/46  Pulse: 68  73   Resp: 12 18  13   Temp: (!) 97.5 F (36.4 C) 97.9 F (36.6 C)  98.1 F (36.7 C)  TempSrc: Axillary Oral  Oral  SpO2: 99%     Weight:      Height:        Intake/Output Summary (Last 24 hours) at 09/16/2023 1232 Last data filed at 09/16/2023 0800 Gross per 24 hour  Intake 744.67 ml  Output 0 ml  Net 744.67 ml     Wt Readings from Last 3 Encounters:  09/12/23 63.5 kg  07/07/23 72.6 kg  05/09/23 78 kg    Physical Exam General: Awake and alert.  Not in any distress.   Cardiovascular: S1 S2 clear.  Respiratory: Clear to auscultation. Gastrointestinal: Soft and nontender. Ext: Amputation of right first and second toes, and amputation of first left today, 09/12/2023. Neuro: Patient moves all extremities.    GU: Foley+    Data Reviewed:  I have personally reviewed following labs    CBC Lab Results  Component Value Date   WBC 13.0 (H) 09/13/2023   RBC 3.70 (L) 09/13/2023   HGB 10.6 (L) 09/13/2023   HCT 33.8 (L) 09/13/2023   MCV 91.4 09/13/2023   MCH 28.6 09/13/2023   PLT 443 (H) 09/13/2023   MCHC 31.4 09/13/2023   RDW 16.2 (H) 09/13/2023   LYMPHSABS 1.9 09/13/2023   MONOABS 0.6 09/13/2023   EOSABS 0.1 09/13/2023   BASOSABS 0.0 09/13/2023     Last metabolic panel Lab Results  Component Value Date   NA 140 09/13/2023   K 3.9 09/13/2023   CL 109 09/13/2023   CO2 22 09/13/2023   BUN 15 09/13/2023   CREATININE 0.87 09/13/2023   GLUCOSE 95 09/13/2023   GFRNONAA >60 09/13/2023   CALCIUM  8.2 (L) 09/13/2023   PHOS 3.3 09/13/2023   PROT 5.6 (L) 09/09/2023   ALBUMIN 2.4 (L) 09/13/2023   BILITOT 0.4 09/09/2023   ALKPHOS 65 09/09/2023   AST 13 (L) 09/09/2023   ALT 17 09/09/2023   ANIONGAP 9 09/13/2023    CBG (last 3)  Recent Labs     09/16/23 0604 09/16/23 0635 09/16/23 1119  GLUCAP 68* 91 156*      Coagulation Profile: No results for input(s): "INR", "PROTIME" in the last 168 hours.   Radiology Studies: I have personally reviewed the imaging studies  No results found.    Time spent: 35 minutes.  Barnetta Chapel M.D. [facility] 09/16/2023, 12:32 PM  Available via Epic secure chat 7am-7pm After 7 pm, please refer to night coverage provider listed on amion.

## 2023-09-16 NOTE — Progress Notes (Signed)
 Physical Therapy Treatment Patient Details Name: Henry Bryant MRN: [id number] DOB: [birth year] Today's Date: 09/16/2023   History of Present Illness 63 y.o. male presents to [facility] 09/06/23 from [facility] with rectal pain and purulent discharge. Hx of prior fall several days prior. Pt w/ sepsis 2/2 rectal abscess and emphysematous cystitis, s/p I&D at [facility]. Pt also has osteomyelitis of B first metatarsals with ulcers of first metatarsal head B. 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 PMHx: T2DM, chronic systolic CHF, hypertension, pulmonary hypertension, A-fib/flutter, recent bilateral toe amputation by podiatry about 4 weeks ago    PT Comments  Patient hesitant to participate due to painful feet and buttock (due to abscess with drain), however easily convinced re: importance of activity. Able to progress to EOB sitting with mod assist; worked on coming to midline and sitting upright. Patient slowly able to laterally scoot to his right along EOB prior to return to supine. Discussed goals of therapy are to begin to put weight on his feet and assess ability to transfer OOB.     If plan is discharge home, recommend the following: A little help with bathing/dressing/bathroom;Assistance with cooking/housework;Assist for transportation;Help with stairs or ramp for entrance;Two people to help with walking and/or transfers   Can travel by private vehicle        Equipment Recommendations  None recommended by PT    Recommendations for Other Services       Precautions / Restrictions Precautions Precautions: Fall Recall of Precautions/Restrictions: Intact Precaution/Restrictions Comments: x2 wound vacs, catheter, drain from rectal abscess Required Braces or Orthoses: Other Brace Other Brace: B post op shoes (pt refused to accept post-op shoes from vendor, not in room) Restrictions Weight Bearing Restrictions Per Provider Order: Yes RLE Weight Bearing  Per Provider Order: Weight bearing as tolerated LLE Weight Bearing Per Provider Order: Weight bearing as tolerated Other Position/Activity Restrictions: WB on heels only for transfers, no gait training     Mobility  Bed Mobility Overal bed mobility: Needs Assistance Bed Mobility: Rolling, Sidelying to Sit, Sit to Sidelying Rolling: Contact guard assist, Used rails Sidelying to sit: Mod assist     Sit to sidelying: Min assist General bed mobility comments: pt able to roll to the left and right with CGA for safety and use of bed rail. Patient able to bring BLEs off of bed, using OTs hand to pull up into sitting at Mod A; assist to raise legs back up onto bed    Transfers Overall transfer level: Needs assistance Equipment used: None Transfers: Bed to chair/wheelchair/BSC            Lateral/Scoot Transfers: Contact guard assist General transfer comment: bed elevated to keep feet off the floor (pt preference); pt able to laterally scoot ~12" toward [facility] with incr time and effort; guarding assist for lines, drain, etc    Ambulation/Gait               General Gait Details: pt refused to attempt standing   Stairs             Wheelchair Mobility     Tilt Bed    Modified Rankin (Stroke Patients Only)       Balance Overall balance assessment: No apparent balance deficits (not formally assessed) (sitting)  Communication Communication Communication: No apparent difficulties  Cognition Arousal: Alert Behavior During Therapy: WFL for tasks assessed/performed   PT - Cognitive impairments: No apparent impairments                         Following commands: Intact      Cueing Cueing Techniques: Verbal cues  Exercises      General Comments        Pertinent Vitals/Pain Pain Assessment Pain Assessment: Faces Faces Pain Scale: Hurts even more Pain Location: rectum, B feet Pain  Descriptors / Indicators: Discomfort, Aching Pain Intervention(s): Limited activity within patient's tolerance, Monitored during session    Home Living                          Prior Function            PT Goals (current goals can now be found in the care plan section) Acute Rehab PT Goals Patient Stated Goal: to go home Time For Goal Achievement: 09/27/23 Potential to Achieve Goals: Good Progress towards PT goals: Progressing toward goals    Frequency    Min 2X/week      PT Plan      Co-evaluation              AM-PAC PT "6 Clicks" Mobility   Outcome Measure  Help needed turning from your back to your side while in a flat bed without using bedrails?: A Little Help needed moving from lying on your back to sitting on the side of a flat bed without using bedrails?: A Little Help needed moving to and from a bed to a chair (including a wheelchair)?: Total Help needed standing up from a chair using your arms (e.g., wheelchair or bedside chair)?: Total Help needed to walk in hospital room?: Total Help needed climbing 3-5 steps with a railing? : Total 6 Click Score: 10    End of Session   Activity Tolerance: Patient limited by pain;Patient limited by fatigue Patient left: in bed;with call bell/phone within reach;with bed alarm set Nurse Communication: Mobility status PT Visit Diagnosis: Other abnormalities of gait and mobility (R26.89);Muscle weakness (generalized) (M62.81)     Time: 1610-9604 PT Time Calculation (min) (ACUTE ONLY): 21 min  Charges:    $Therapeutic Activity: 8-22 mins PT General Charges $$ ACUTE PT VISIT: 1 Visit                      Jerolyn Center, PT Acute Rehabilitation Services  Office [phone]    Zena Amos 09/16/2023, 12:10 PM

## 2023-09-16 NOTE — Plan of Care (Signed)
   Problem: Education: Goal: Knowledge of General Education information will improve Description Including pain rating scale, medication(s)/side effects and non-pharmacologic comfort measures Outcome: Progressing

## 2023-09-16 NOTE — Progress Notes (Signed)
 Progress Note  4 Days Post-Op  Subjective: Pt reports feeling tired after PT. Some rectal pain, has not had a BM since 3/2.   Objective: Vital signs in last 24 hours: Temp:  [97.5 F (36.4 C)-98.5 F (36.9 C)] 98.1 F (36.7 C) (03/11 1121) Pulse Rate:  [68-73] 73 (03/11 1055) Resp:  [12-18] 13 (03/11 1121) BP: (104-141)/(25-50) 141/46 (03/11 1121) SpO2:  [99 %] 99 % (03/11 0615) Last BM Date : 09/12/23  Intake/Output from previous day: 03/10 0701 - 03/11 0700 In: 744.7 [I.V.:744.7] Out: 0  Intake/Output this shift: No intake/output data recorded.  PE: General: pleasant, WD, chronically ill appearing male who is laying in bed in NAD Lungs: Respiratory effort nonlabored Abd: soft, NT, ND GU: foley in left gluteal opening with purulent drainage in bag but no drainage on probing of cavity (removed without complication), no external cellulitis MS: VAC to BLE Psych: A&Ox3 with an appropriate affect.   Lab Results:  No results for input(s): "WBC", "HGB", "HCT", "PLT" in the last 72 hours. BMET No results for input(s): "NA", "K", "CL", "CO2", "GLUCOSE", "BUN", "CREATININE", "CALCIUM" in the last 72 hours. PT/INR No results for input(s): "LABPROT", "INR" in the last 72 hours. CMP     Component Value Date/Time   NA 140 09/13/2023 0326   K 3.9 09/13/2023 0326   CL 109 09/13/2023 0326   CO2 22 09/13/2023 0326   GLUCOSE 95 09/13/2023 0326   BUN 15 09/13/2023 0326   CREATININE 0.87 09/13/2023 0326   CALCIUM 8.2 (L) 09/13/2023 0326   PROT 5.6 (L) 09/09/2023 0456   ALBUMIN 2.4 (L) 09/13/2023 0326   AST 13 (L) 09/09/2023 0456   ALT 17 09/09/2023 0456   ALKPHOS 65 09/09/2023 0456   BILITOT 0.4 09/09/2023 0456   GFRNONAA >60 09/13/2023 0326   Lipase  No results found for: "LIPASE"     Studies/Results: No results found.  Anti-infectives: Anti-infectives (From admission, onward)    Start     Dose/Rate Route Frequency Ordered Stop   09/13/23 1300   amoxicillin-clavulanate (AUGMENTIN) 875-125 MG per tablet 1 tablet        1 tablet Oral Every 12 hours 09/12/23 1134 09/15/23 2129   09/12/23 1230  ceFAZolin (ANCEF) IVPB 2g/100 mL premix  Status:  Discontinued        2 g 200 mL/hr over 30 Minutes Intravenous Every 8 hours 09/12/23 1140 09/12/23 1930   09/12/23 0945  vancomycin (VANCOCIN) IVPB 1000 mg/200 mL premix        1,000 mg 200 mL/hr over 60 Minutes Intravenous To Surgery 09/12/23 0933 09/13/23 0945   09/12/23 0935  vancomycin (VANCOCIN) 1-5 GM/200ML-% IVPB       Note to Pharmacy: Isabel Caprice, Destiny: cabinet override      09/12/23 0935 09/12/23 1041   09/12/23 0915  ceFAZolin (ANCEF) IVPB 2g/100 mL premix  Status:  Discontinued        2 g 200 mL/hr over 30 Minutes Intravenous On call to O.R. 09/12/23 0902 09/12/23 1125   09/09/23 1300  Ampicillin-Sulbactam (UNASYN) 3 g in sodium chloride 0.9 % 100 mL IVPB        3 g 200 mL/hr over 30 Minutes Intravenous Every 6 hours 09/09/23 1204 09/13/23 0721   09/09/23 1300  doxycycline (VIBRA-TABS) tablet 100 mg        100 mg Oral Every 12 hours 09/09/23 1204 09/15/23 2129   09/08/23 2000  linezolid (ZYVOX) IVPB 600 mg  Status:  Discontinued  600 mg 300 mL/hr over 60 Minutes Intravenous Every 12 hours 09/08/23 1117 09/09/23 1204   09/07/23 0800  vancomycin (VANCOREADY) IVPB 750 mg/150 mL  Status:  Discontinued        750 mg 150 mL/hr over 60 Minutes Intravenous Every 12 hours 09/06/23 1834 09/08/23 1117   09/06/23 1930  vancomycin (VANCOCIN) IVPB 1000 mg/200 mL premix        1,000 mg 200 mL/hr over 60 Minutes Intravenous STAT 09/06/23 1834 09/06/23 2300   09/06/23 1930  piperacillin-tazobactam (ZOSYN) IVPB 3.375 g  Status:  Discontinued        3.375 g 12.5 mL/hr over 240 Minutes Intravenous Every 8 hours 09/06/23 1834 09/09/23 1204        Assessment/Plan  Perirectal/gluteal abscess - S/P I&D at [facility] - no purulence noted on probing of cavity today, no external  cellulitis - fluid in drainage bag present for several days per patient and chart - drain removed at bedside - recommend sitz/perineal care after BMs to keep area clean  - completed abx per ID - follow up with surgeon in [place] or PCP in 3-4 weeks for wound check - if recurrent cellulitis develops or concern for recurrent infection - would recommend repeat CT pelvis to evaluate - no other recommendations from a surgical standpoint, general surgery will sign off  Constipation - recommend bowel regimen which was ordered by primary team today, OK to have a suppository    FEN: HH/CM diet VTE: LMWH 60 mg BID ID: completed abx per ID   - Per TRH/urology -  Emphysematous cystitis - foley per urology/TRH, cysto was negative on 3/3 IDDM diabetic foot ulcers and hx of recent amputation 4 weeks ago - Dr. Lajoyce Corners following and s/p amputation of toes bilaterally  PAD chronic systolic CHF HTN pulmonary HTN A.Fib/flutter   LOS: 10 days   I reviewed hospitalist notes, last 24 h vitals and pain scores, last 48 h intake and output, last 24 h labs and trends, and last 24 h imaging results.  This care required moderate level of medical decision making.    Juliet Rude, [facility] 09/16/2023, 1:19 PM Please see Amion for pager number during day hours 7:00am-4:30pm

## 2023-09-16 NOTE — Plan of Care (Signed)
  Problem: Health Behavior/Discharge Planning: Goal: Ability to manage health-related needs will improve Outcome: Not Progressing   Problem: Activity: Goal: Risk for activity intolerance will decrease Outcome: Not Progressing

## 2023-09-16 NOTE — Progress Notes (Signed)
Patient refused foley care. 

## 2023-09-16 NOTE — TOC Initial Note (Signed)
 Transition of Care [facility]) - Initial/Assessment Note    Patient Details  Name: Henry Bryant MRN: [id number] Date of Birth: [birth year]  [facility]) CM/SW Contact:    Eduard Roux, LCSW Phone Number: 09/16/2023, 4:27 PM  Clinical Narrative:                  CSW met with patient at bedside. CSW  introduce self and explained role. Patient confirmed he is agreeable to short term rehab at [facility]. His preference is [facility] or [facility]. He was agreeable to send to other facilities as back up.He reports his wife is currently at [facility]. CSW explained the SNF process. All questions answered.   Patient payor source limits his options CSW sent message to [facility], follow up on availability, they do not have bed available.   TOC will provide bed offers once available.  Antony Blackbird, MSW, LCSW Clinical Social Worker     Expected Discharge Plan: Skilled Nursing Facility Barriers to Discharge: Continued Medical Work up   Patient Goals and CMS Choice Patient states their goals for this hospitalization and ongoing recovery are:: Home CMS Medicare.gov Compare Post Acute Care list provided to:: Patient Choice offered to / list presented to : Patient [place] ownership interest in [facility].provided to:: Patient    Expected Discharge Plan and Services In-house Referral: Clinical Social Work     Living arrangements for the past 2 months: Single Family Home                                      Prior Living Arrangements/Services Living arrangements for the past 2 months: Single Family Home Lives with:: Self, Spouse Patient language and need for interpreter reviewed:: No        Need for Family Participation in Patient Care: Yes (Comment) Care giver support system in place?: Yes (comment)   Criminal Activity/Legal Involvement Pertinent to Current Situation/Hospitalization: No - Comment as needed  Activities of Daily Living   ADL  Screening (condition at time of admission) Independently performs ADLs?: Yes (appropriate for developmental age) Is the patient deaf or have difficulty hearing?: No Does the patient have difficulty seeing, even when wearing glasses/contacts?: No Does the patient have difficulty concentrating, remembering, or making decisions?: No  Permission Sought/Granted      Share Information with NAME: Banyan Goodchild  Permission granted to share info w AGENCY: SNFs  Permission granted to share info w Relationship: spouse  Permission granted to share info w Contact Information: .[phone]  Emotional Assessment Appearance:: Appears stated age Attitude/Demeanor/Rapport: Engaged Affect (typically observed): Accepting, Appropriate Orientation: : Oriented to Self, Oriented to Place, Oriented to  Time, Oriented to Situation Alcohol / Substance Use: Not Applicable Psych Involvement: No (comment)  Admission diagnosis:  Bladder-neck obstruction [N32.0] Hematuria [R31.9] Rectal abscess [K61.1] Patient Active Problem List   Diagnosis Date Noted   Bacteremia due to Klebsiella pneumoniae 09/10/2023   Sepsis due to Klebsiella (HCC) 09/10/2023   Uncontrolled type 2 diabetes mellitus with hyperglycemia, without long-term current use of insulin (HCC) 09/10/2023   DNR (do not resuscitate) 09/10/2023   Subacute osteomyelitis of foot (HCC) 09/10/2023   Peripheral artery disease (HCC) 09/10/2023   Emphysematous cystitis 09/09/2023   Rectal abscess 09/06/2023   Ambulatory dysfunction 07/24/2023   Dyslipidemia 07/17/2023   BPH with urinary obstruction 07/17/2023   Recurrent UTI 07/17/2023   Renal lesion  07/17/2023   Diabetic ulcer of right great toe (HCC) 06/26/2023   Systolic CHF (HCC) 06/13/2023   Age-related physical debility 09/23/2022   Diabetes (HCC) 09/23/2022   Diabetic ulcer of left heel associated with type 2 diabetes mellitus (HCC) 03/31/2020   Chronic systolic CHF (congestive heart failure) (HCC)  - 20-25% by echo 06-2023 at [facility] 07/28/2019   Atypical atrial flutter (HCC) 07/19/2019   Cardiomyopathy (HCC) 12/02/2018   Coronary artery disease 12/02/2018   Dysphonia 11/18/2016   Essential hypertension 05/16/2015   Cigarette smoker 05/03/2015   Dyspnea 05/02/2015   Pulmonary hypertension due to left heart disease (HCC) 07/05/2012   Heart disease 07/05/2012   COPD GOLD II with reversibility  02/13/2012   LV dysfunction 12/14/2011   Chronic pulmonary heart disease (HCC) 12/14/2011   PCP:  Toma Deiters, MD Pharmacy:   [facility] [place] - [address] [place] [address] Phone: [phone] Fax: [phone]     Social Drivers of Health (SDOH) Social History: SDOH Screenings   Food Insecurity: No Food Insecurity (09/06/2023)  Recent Concern: Food Insecurity - Food Insecurity Present (08/11/2023)   Received from [facility]  Housing: Low Risk  (09/06/2023)  Transportation Needs: No Transportation Needs (09/06/2023)  Utilities: Not At Risk (09/06/2023)  Financial Resource Strain: Low Risk  (08/11/2023)   Received from [facility]  Physical Activity: Inactive (08/11/2023)   Received from [facility]  Social Connections: Socially Integrated (08/11/2023)   Received from [facility]  Stress: No Stress Concern Present (08/11/2023)   Received from [facility]  Tobacco Use: Medium Risk (09/12/2023)  Health Literacy: Medium Risk (08/11/2023)   Received from [facility]   SDOH Interventions:     Readmission Risk Interventions     No data to display

## 2023-09-17 DIAGNOSIS — K611 Rectal abscess: Secondary | ICD-10-CM | POA: Diagnosis not present

## 2023-09-17 LAB — GLUCOSE, CAPILLARY
Glucose-Capillary: 115 mg/dL — ABNORMAL HIGH (ref 70–99)
Glucose-Capillary: 105 mg/dL — ABNORMAL HIGH (ref 70–99)
Glucose-Capillary: 166 mg/dL — ABNORMAL HIGH (ref 70–99)
Glucose-Capillary: 154 mg/dL — ABNORMAL HIGH (ref 70–99)

## 2023-09-17 LAB — CBC
HCT: 25.7 % — ABNORMAL LOW (ref 39.0–52.0)
Hemoglobin: 8.2 g/dL — ABNORMAL LOW (ref 13.0–17.0)
MCH: 28.2 pg (ref 26.0–34.0)
MCHC: 31.9 g/dL (ref 30.0–36.0)
MCV: 88.3 fL (ref 80.0–100.0)
Platelets: 417 10*3/uL — ABNORMAL HIGH (ref 150–400)
RBC: 2.91 MIL/uL — ABNORMAL LOW (ref 4.22–5.81)
RDW: 15.7 % — ABNORMAL HIGH (ref 11.5–15.5)
WBC: 8.8 10*3/uL (ref 4.0–10.5)
nRBC: 0 % (ref 0.0–0.2)

## 2023-09-17 MED ORDER — APIXABAN 5 MG PO TABS
5.0000 mg | ORAL_TABLET | Freq: Two times a day (BID) | ORAL | Status: DC
  Administered 2023-09-17 – 2023-09-23 (×12): 5 mg via ORAL
  Filled 2023-09-17 (×12): qty 1

## 2023-09-17 NOTE — Plan of Care (Signed)
 Problem: Education: Goal: Knowledge of General Education information will improve Description: Including pain rating scale, medication(s)/side effects and non-pharmacologic comfort measures Outcome: Progressing   Problem: Health Behavior/Discharge Planning: Goal: Ability to manage health-related needs will improve Outcome: Progressing   Problem: Clinical Measurements: Goal: Ability to maintain clinical measurements within normal limits will improve Outcome: Progressing Goal: Will remain free from infection Outcome: Progressing Goal: Diagnostic test results will improve Outcome: Progressing Goal: Respiratory complications will improve Outcome: Progressing Goal: Cardiovascular complication will be avoided Outcome: Progressing   Problem: Activity: Goal: Risk for activity intolerance will decrease Outcome: Progressing   Problem: Nutrition: Goal: Adequate nutrition will be maintained Outcome: Progressing   Problem: Coping: Goal: Level of anxiety will decrease Outcome: Progressing   Problem: Elimination: Goal: Will not experience complications related to bowel motility Outcome: Progressing Goal: Will not experience complications related to urinary retention Outcome: Progressing   Problem: Pain Managment: Goal: General experience of comfort will improve and/or be controlled Outcome: Progressing   Problem: Safety: Goal: Ability to remain free from injury will improve Outcome: Progressing   Problem: Skin Integrity: Goal: Risk for impaired skin integrity will decrease Outcome: Progressing   Problem: Education: Goal: Ability to describe self-care measures that may prevent or decrease complications (Diabetes Survival Skills Education) will improve Outcome: Progressing Goal: Individualized Educational Video(s) Outcome: Progressing   Problem: Coping: Goal: Ability to adjust to condition or change in health will improve Outcome: Progressing   Problem: Fluid  Volume: Goal: Ability to maintain a balanced intake and output will improve Outcome: Progressing   Problem: Health Behavior/Discharge Planning: Goal: Ability to identify and utilize available resources and services will improve Outcome: Progressing Goal: Ability to manage health-related needs will improve Outcome: Progressing   Problem: Metabolic: Goal: Ability to maintain appropriate glucose levels will improve Outcome: Progressing   Problem: Nutritional: Goal: Maintenance of adequate nutrition will improve Outcome: Progressing Goal: Progress toward achieving an optimal weight will improve Outcome: Progressing   Problem: Skin Integrity: Goal: Risk for impaired skin integrity will decrease Outcome: Progressing   Problem: Tissue Perfusion: Goal: Adequacy of tissue perfusion will improve Outcome: Progressing   Problem: Education: Goal: Knowledge of General Education information will improve Description: Including pain rating scale, medication(s)/side effects and non-pharmacologic comfort measures Outcome: Progressing   Problem: Health Behavior/Discharge Planning: Goal: Ability to manage health-related needs will improve Outcome: Progressing   Problem: Clinical Measurements: Goal: Ability to maintain clinical measurements within normal limits will improve Outcome: Progressing Goal: Will remain free from infection Outcome: Progressing Goal: Diagnostic test results will improve Outcome: Progressing Goal: Respiratory complications will improve Outcome: Progressing Goal: Cardiovascular complication will be avoided Outcome: Progressing   Problem: Activity: Goal: Risk for activity intolerance will decrease Outcome: Progressing   Problem: Nutrition: Goal: Adequate nutrition will be maintained Outcome: Progressing   Problem: Coping: Goal: Level of anxiety will decrease Outcome: Progressing   Problem: Elimination: Goal: Will not experience complications related to  bowel motility Outcome: Progressing Goal: Will not experience complications related to urinary retention Outcome: Progressing   Problem: Pain Managment: Goal: General experience of comfort will improve and/or be controlled Outcome: Progressing   Problem: Safety: Goal: Ability to remain free from injury will improve Outcome: Progressing   Problem: Skin Integrity: Goal: Risk for impaired skin integrity will decrease Outcome: Progressing   Problem: Education: Goal: Knowledge of the prescribed therapeutic regimen will improve Outcome: Progressing Goal: Ability to verbalize activity precautions or restrictions will improve Outcome: Progressing Goal: Understanding of discharge needs will improve Outcome: Progressing  Problem: Activity: Goal: Ability to perform//tolerate increased activity and mobilize with assistive devices will improve Outcome: Progressing

## 2023-09-17 NOTE — TOC Progression Note (Signed)
 Transition of Care [facility]) - Progression Note    Patient Details  Name: Henry Bryant MRN: [id number] Date of Birth: 18-Mar-1961  [facility]) CM/SW Contact  Carley Hammed, LCSW Phone Number: 09/17/2023, 1:03 PM  Clinical Narrative:    CSW reviewed chart and spoke with pt to discuss options. Pt noting he will not go to [place] and wants to wait for a bed at [facility]. Pt asks why he can't wait to turn 63 in two weeks and have Medicare cover it. CSW unsure of pt's upcoming coverage.  CSW spoke with Revonda Standard, she notes that [facility] not offer, and now [facility] would be withdrawing. CSW sent out some additional referrals. No bed options at this time.    Expected Discharge Plan: Skilled Nursing Facility Barriers to Discharge: Continued Medical Work up  Expected Discharge Plan and Services In-house Referral: Clinical Social Work     Living arrangements for the past 2 months: Single Family Home                                       Social Determinants of Health (SDOH) Interventions SDOH Screenings   Food Insecurity: No Food Insecurity (09/06/2023)  Recent Concern: Food Insecurity - Food Insecurity Present (08/11/2023)   Received [facility]  Housing: Low Risk  (09/06/2023)  Transportation Needs: No Transportation Needs (09/06/2023)  Utilities: Not At Risk (09/06/2023)  Financial Resource Strain: Low Risk  (08/11/2023)   Received from [facility]  Physical Activity: Inactive (08/11/2023)   Received from [facility]  Social Connections: Socially Integrated (08/11/2023)   Received from [facility]  Stress: No Stress Concern Present (08/11/2023)   [facility]  Tobacco Use: Medium Risk (09/12/2023)  Health Literacy: Medium Risk (08/11/2023)   Received from [facility]    Readmission Risk Interventions     No data to display

## 2023-09-17 NOTE — Plan of Care (Signed)
  Problem: Pain Managment: Goal: General experience of comfort will improve and/or be controlled Outcome: Progressing   Problem: Safety: Goal: Ability to remain free from injury will improve Outcome: Progressing   Problem: Skin Integrity: Goal: Risk for impaired skin integrity will decrease Outcome: Progressing

## 2023-09-17 NOTE — Progress Notes (Signed)
 Progress Note   Patient: Henry Bryant:[id number] DOB: July 23, 1960 DOA: 09/06/2023     11 DOS: the patient was seen and examined on 09/17/2023   Brief hospital course: 62yo with h/o DM, chronic HRrEF, HTN, pulmonary HTN, afib, recent B toe amputation, and urinary retention with self-catheterization who presented on 3/1 with rectal pain and purulent discharge.  He was found to have a rectal abscess with Klebsiella pneumoniae bacteremia (also UTI) s/p I&D (OSH); surgery has consulted and he has completed antibiotic therapy.  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  Wound vacs are in place.  He is medically stable and awaiting SNF placement.  Assessment and Plan:   *Patient is medically stable but does not have any current SNF rehab bed offers.  Will need ongoing hospitalization pending safe discharge.   Sepsis due to rectal abscess Emphysematous cystitis, Klebsiella UTI Klebsiella pneumoniae bacteremia -Imaging at [facility] had shown large loculated collection in the left buttocks and perirectal tissues, underwent I&D there, catheter was placed into the abscess cavity to help facilitate drainage. -CT abdomen 09/08/2023 revealed significant improvement in the left perirectal and left buttock region compared to prior exam.  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 -Seen by surgery, Dr. Dwain Sarna, recommended discharge on 10 days of antibiotics and follow-up in [facility] with [facility] surgeon -Patient was on IV Unasyn, and eventually transition to oral Augmentin and doxycycline.  Patient has completed antibiotics.   -Blood cultures at [facility] positive for Klebsiella pneumonia, urine culture here on 3/1 positive for Klebsiella -Seen by urology on 09/08/2023, Dr. Lafonda Mosses.  Urology team recommended continuing Foley catheter -Ongoing rectal pain noted   Diabetic ulcer of toes, osteomyelitis bilateral first metatarsals -Prior toe  amputation at [facility] in 08/2023.   -MRI of the toes showed prior amputation of the great toe with osteomyelitis of the distal half of the first metatarsal on the left and right, no abscess.  - Vascular surgery requested orthopedic opinion.   -Seen by Dr. Lajoyce Corners, recommended first ray and second toe amputation bilaterally, and dressing changes to both knees -Patient underwent surgery on 09/12/2023 -He denies pain in the feet at all at this time -B wound vacs remain in place   Peripheral arterial disease with history of revascularization -Followed by vascular surgery outpatient, last saw Dr. Hetty Blend, underwent lower extremity angiogram with revascularization in 06/2023  -Given poor healing and findings of osteomyelitis, vascular surgery recommended orthopedics consult   Insulin-dependent diabetes mellitus, uncontrolled with with hyperglycemia -hemoglobin A1c 6.8 on 09/06/2023, good control -Continue Lantus 30 units at bedtime -Hold Jardiance -SSI moderate    Chronic systolic CHF -euvolemic -Continue Toprol-XL, low-dose Entresto, Crestor  -Hold spironolactone   Atrial flutter -Rate controlled with amiodarone, metoprolol -Resume Eliquis  Anxiety -Continue alprazolam  Urinary retention -Has been doing self-cath q5h at home -Currently has a foley, which urology recommends leaving in -Continue tamsulosin    Consultants: Surgery Urology Vascular surgery ID  Procedures: Amputations of B 1st rays and 2nd ray of R foot, 3/7  Antibiotics: Unasyn -> Augmentin x 7 days Cefazolin x 1 dose Doxycycline x 7 days Zosyn x 3 days Vancomycin x 3 days    30 Day Unplanned Readmission Risk Score    Flowsheet Row Admission (Current) from 09/06/2023 in [facility]  SURGICAL  30 Day Unplanned Readmission Risk Score (%) 13.94 Filed at 09/17/2023 0801       This  score is the patient's risk of an unplanned readmission within 30 days of being discharged (0 -100%). The  score is based on dignosis, age, lab data, medications, orders, and past utilization.   Low:  0-14.9   Medium: 15-21.9   High: 22-29.9   Extreme: 30 and above           Subjective: Ongoing rectal pain.  Feet are not bothering him.   Objective: Vitals:   09/17/23 1204 09/17/23 1612  BP: (!) 111/52 (!) 103/52  Pulse: 67 67  Resp: 18 18  Temp: 98.7 F (37.1 C) 97.6 F (36.4 C)  SpO2: 98% 100%    Intake/Output Summary (Last 24 hours) at 09/17/2023 1806 Last data filed at 09/17/2023 1700 Gross per 24 hour  Intake 613.83 ml  Output 650 ml  Net -36.17 ml   Filed Weights   09/06/23 1751 09/12/23 0859  Weight: 63.8 kg 63.5 kg    Exam:  General:  Appears calm and comfortable and is in NAD Eyes:  EOMI, normal lids, iris ENT:  grossly normal hearing, lips & tongue, mmm Neck:  no LAD, masses or thyromegaly Cardiovascular:  RRR, no m/r/g. No LE edema.  Respiratory:   CTA bilaterally with no wheezes/rales/rhonchi.  Normal respiratory effort. Abdomen:  soft, NT, ND Skin:  s/p B toe/ray amputations with B wound vacs in place Musculoskeletal:  grossly normal tone BUE/BLE, good ROM, as above Psychiatric:  blunted mood and affect, speech fluent and appropriate, AOx3 Neurologic:  CN 2-12 grossly intact, moves all extremities in coordinated fashion  Data Reviewed: I have reviewed the patient's lab results since admission.  Pertinent labs for today include:   WBC 8.8 Hgb 8.2 Platelets 417     Family Communication: None present  Disposition: Status is: Inpatient Remains inpatient appropriate because: awaiting placement     Time spent: 50 minutes  Unresulted Labs (From admission, onward)     Start     Ordered   09/18/23 0500  CBC with Differential/Platelet  Tomorrow morning,   R       Question:  Specimen collection method  Answer:  Lab=Lab collect   09/17/23 1802   09/18/23 0500  Basic metabolic panel  Tomorrow morning,   R       Question:  Specimen collection  method  Answer:  Lab=Lab collect   09/17/23 1802             Author: Jonah Blue, MD 09/17/2023 6:06 PM  For on call review [email or web address].

## 2023-09-17 NOTE — Progress Notes (Signed)
 Occupational Therapy Treatment Patient Details Name: Henry Bryant MRN: [id number] DOB: [birth year] Today's Date: 09/17/2023   History of present illness 63 y.o. male presents to [facility] 09[facility]06[facility]23 from [facility] with rectal pain and purulent discharge. Hx of prior fall several days prior. Pt w/ sepsis 2/2 rectal abscess and emphysematous cystitis, s/p I&D at [facility]. Pt also has osteomyelitis of B first metatarsals with ulcers of first metatarsal head B. 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 PMHx: T2DM, chronic systolic CHF, hypertension, pulmonary hypertension, A-fib/flutter, recent bilateral toe amputation by podiatry about 4 weeks ago   OT comments  Patient supine in bed, reports just receiving pain medications.  Agreealbe to bed level activities.  Pt able to demonstrates rolling and repositioning in bed with supervision, discussed pressure relief and positional changes for comfort. ADLs at bed level with setup to total assist. Declines OOB or EOB this session.  Will follow acutely. Continue to recommend <3hrs/day inpatient setting at dc.       If plan is discharge home, recommend the following:  Two people to help with walking and/or transfers;A lot of help with bathing/dressing/bathroom;Direct supervision/assist for medications management;Direct supervision/assist for financial management;Assist for transportation;Help with stairs or ramp for entrance;Supervision due to cognitive status   Equipment Recommendations  Other (comment) (defer)    Recommendations for Other Services      Precautions / Restrictions Precautions Precautions: Fall Recall of Precautions/Restrictions: Intact Precaution/Restrictions Comments: x2 wound vacs, catheter, drain from rectal abscess Required Braces or Orthoses: Other Brace Other Brace: B post op shoes (pt refused to accept post-op shoes from vendor, not in room) Restrictions Weight Bearing Restrictions Per  Provider Order: Yes RLE Weight Bearing Per Provider Order: Weight bearing as tolerated LLE Weight Bearing Per Provider Order: Weight bearing as tolerated Other Position/Activity Restrictions: WB on heels only for transfers, no gait training       Mobility Bed Mobility Overal bed mobility: Needs Assistance Bed Mobility: Rolling Rolling: Supervision         General bed mobility comments: rolling to L and R with supervision, able to repositoin in bed without assist    Transfers                   General transfer comment: deferred d/t pain and pt declined     Balance                                           ADL either performed or assessed with clinical judgement   ADL Overall ADL's : Needs assistance/impaired     Grooming: Set up;Sitting               Lower Body Dressing: Total assistance;Bed level     Toilet Transfer Details (indicate cue type and reason): deferred d/t pain         Functional mobility during ADLs: Supervision/safety General ADL Comments: limited to bed level due to pain. Discussed pressure relief and changing positioning, pt able to reposition in bed without assistance.    Extremity/Trunk Assessment              Vision       Perception     Praxis     Communication Communication Communication: No apparent difficulties   Cognition Arousal: Alert Behavior During Therapy: WFL for tasks assessed/performed  OT - Cognition Comments: not formally assessed                 Following commands: Intact        Cueing   Cueing Techniques: Verbal cues  Exercises      Shoulder Instructions       General Comments B wound vacs to feet    Pertinent Vitals/ Pain       Pain Assessment Pain Assessment: Faces Faces Pain Scale: Hurts even more Pain Location: rectum, B feet Pain Descriptors / Indicators: Discomfort, Aching Pain Intervention(s): Limited activity within patient's  tolerance, Monitored during session, Repositioned  Home Living                                          Prior Functioning/Environment              Frequency  Min 2X/week        Progress Toward Goals  OT Goals(current goals can now be found in the care plan section)  Progress towards OT goals: Progressing toward goals (slowly)  Acute Rehab OT Goals Patient Stated Goal: get better OT Goal Formulation: With patient Time For Goal Achievement: 09/28/23 Potential to Achieve Goals: Good  Plan      Co-evaluation                 AM-PAC OT "6 Clicks" Daily Activity     Outcome Measure   Help from another person eating meals?: A Little Help from another person taking care of personal grooming?: A Little Help from another person toileting, which includes using toliet, bedpan, or urinal?: A Lot Help from another person bathing (including washing, rinsing, drying)?: A Lot Help from another person to put on and taking off regular upper body clothing?: A Little Help from another person to put on and taking off regular lower body clothing?: A Lot 6 Click Score: 15    End of Session    OT Visit Diagnosis: Unsteadiness on feet (R26.81);Other abnormalities of gait and mobility (R26.89);Repeated falls (R29.6);Muscle weakness (generalized) (M62.81);Other symptoms and signs involving cognitive function;Pain   Activity Tolerance Patient limited by pain   Patient Left in bed;with call bell/phone within reach;with bed alarm set   Nurse Communication Mobility status        Time: 6045-4098 OT Time Calculation (min): 16 min  Charges: OT General Charges $OT Visit: 1 Visit OT Treatments $Self Care/Home Management : 8-22 mins  Barry Brunner, OT Acute Rehabilitation Services Office [phone] Secure Chat Preferred    Henry Bryant 09/17/2023, 2:00 PM

## 2023-09-17 NOTE — Plan of Care (Signed)
  Problem: Pain Managment: Goal: General experience of comfort will improve and/or be controlled Outcome: Progressing   Problem: Safety: Goal: Ability to remain free from injury will improve Outcome: Progressing   Problem: Clinical Measurements: Goal: Ability to maintain clinical measurements within normal limits will improve Outcome: Progressing   Problem: Clinical Measurements: Goal: Will remain free from infection Outcome: Progressing

## 2023-09-17 NOTE — Progress Notes (Signed)
 Patient upset because he wants juice and graham crackers.  Advised of diet.  Patient stated he has never been on a diet and does not intend on starting.

## 2023-09-18 ENCOUNTER — Inpatient Hospital Stay (HOSPITAL_COMMUNITY)

## 2023-09-18 DIAGNOSIS — K611 Rectal abscess: Secondary | ICD-10-CM | POA: Diagnosis not present

## 2023-09-18 LAB — CBC WITH DIFFERENTIAL/PLATELET
Abs Immature Granulocytes: 0.03 10*3/uL (ref 0.00–0.07)
Basophils Absolute: 0 10*3/uL (ref 0.0–0.1)
Basophils Relative: 0 %
Eosinophils Absolute: 0.1 10*3/uL (ref 0.0–0.5)
Eosinophils Relative: 1 %
HCT: 26.3 % — ABNORMAL LOW (ref 39.0–52.0)
Hemoglobin: 8.3 g/dL — ABNORMAL LOW (ref 13.0–17.0)
Immature Granulocytes: 0 %
Lymphocytes Relative: 19 %
Lymphs Abs: 1.6 10*3/uL (ref 0.7–4.0)
MCH: 27.7 pg (ref 26.0–34.0)
MCHC: 31.6 g/dL (ref 30.0–36.0)
MCV: 87.7 fL (ref 80.0–100.0)
Monocytes Absolute: 0.6 10*3/uL (ref 0.1–1.0)
Monocytes Relative: 7 %
Neutro Abs: 6.2 10*3/uL (ref 1.7–7.7)
Neutrophils Relative %: 73 %
Platelets: 405 10*3/uL — ABNORMAL HIGH (ref 150–400)
RBC: 3 MIL/uL — ABNORMAL LOW (ref 4.22–5.81)
RDW: 15.6 % — ABNORMAL HIGH (ref 11.5–15.5)
WBC: 8.5 10*3/uL (ref 4.0–10.5)
nRBC: 0 % (ref 0.0–0.2)

## 2023-09-18 LAB — BASIC METABOLIC PANEL
Anion gap: 12 (ref 5–15)
BUN: 11 mg/dL (ref 8–23)
CO2: 21 mmol/L — ABNORMAL LOW (ref 22–32)
Calcium: 8.2 mg/dL — ABNORMAL LOW (ref 8.9–10.3)
Chloride: 104 mmol/L (ref 98–111)
Creatinine, Ser: 0.89 mg/dL (ref 0.61–1.24)
GFR, Estimated: >60 mL/min (ref >60)
Glucose, Bld: 134 mg/dL — ABNORMAL HIGH (ref 70–99)
Potassium: 3.6 mmol/L (ref 3.5–5.1)
Sodium: 137 mmol/L (ref 135–145)

## 2023-09-18 LAB — GLUCOSE, CAPILLARY
Glucose-Capillary: 164 mg/dL — ABNORMAL HIGH (ref 70–99)
Glucose-Capillary: 144 mg/dL — ABNORMAL HIGH (ref 70–99)
Glucose-Capillary: 148 mg/dL — ABNORMAL HIGH (ref 70–99)
Glucose-Capillary: 200 mg/dL — ABNORMAL HIGH (ref 70–99)

## 2023-09-18 MED ORDER — POLYETHYLENE GLYCOL 3350 17 G PO PACK
17.0000 g | PACK | Freq: Two times a day (BID) | ORAL | Status: DC
  Administered 2023-09-18 – 2023-09-21 (×6): 17 g via ORAL
  Filled 2023-09-18 (×9): qty 1

## 2023-09-18 NOTE — Plan of Care (Signed)
  Problem: Pain Managment: Goal: General experience of comfort will improve and/or be controlled 09/18/2023 0007 by Sammuel Cooper, RN Outcome: Progressing 09/17/2023 2329 by Sammuel Cooper, RN Outcome: Progressing   Problem: Safety: Goal: Ability to remain free from injury will improve 09/18/2023 0007 by Sammuel Cooper, RN Outcome: Progressing 09/17/2023 2329 by Sammuel Cooper, RN Outcome: Progressing   Problem: Skin Integrity: Goal: Risk for impaired skin integrity will decrease Outcome: Progressing

## 2023-09-18 NOTE — Plan of Care (Signed)
  Problem: Nutrition: Goal: Adequate nutrition will be maintained Outcome: Progressing   Problem: Elimination: Goal: Will not experience complications related to urinary retention Outcome: Progressing   Problem: Pain Managment: Goal: General experience of comfort will improve and/or be controlled Outcome: Not Progressing   Problem: Education: Goal: Knowledge of General Education information will improve Description: Including pain rating scale, medication(s)/side effects and non-pharmacologic comfort measures Outcome: Progressing   Problem: Clinical Measurements: Goal: Respiratory complications will improve Outcome: Progressing Goal: Cardiovascular complication will be avoided Outcome: Progressing

## 2023-09-18 NOTE — Progress Notes (Signed)
 Progress Note  6 Days Post-Op  Subjective: Pt reports ongoing pain in backside, unsure whether pain is more in gluteal area vs rectal. He passed 2 small hard balls of stool yesterday. He does not feel like there has been significantly more drainage from gluteal wound since drain removal.   Objective: Vital signs in last 24 hours: Temp:  [97.6 F (36.4 C)-98.7 F (37.1 C)] 97.8 F (36.6 C) (03/13 0750) Pulse Rate:  [65-73] 71 (03/13 0954) Resp:  [16-18] 17 (03/13 0750) BP: (101-136)/(43-58) 101/44 (03/13 0954) SpO2:  [98 %-100 %] 100 % (03/13 0750) Last BM Date : 09/16/23  Intake/Output from previous day: 03/12 0701 - 03/13 0700 In: 953.8 [P.O.:480; I.V.:473.8] Out: 550 [Urine:550] Intake/Output this shift: Total I/O In: 240 [P.O.:240] Out: 500 [Urine:500]  PE: General: pleasant, WD, chronically ill appearing male who is laying in bed in NAD Lungs: Respiratory effort nonlabored Abd: soft, NT, ND GU: left gluteal wound open with minimal purulent drainage, mild erythema just around wound, cavity extends ~5cm  MS: VAC to BLE Psych: A&Ox3 with an appropriate affect.   Lab Results:  Recent Labs    09/17/23 0617 09/18/23 0703  WBC 8.8 8.5  HGB 8.2* 8.3*  HCT 25.7* 26.3*  PLT 417* 405*   BMET Recent Labs    09/18/23 0703  NA 137  K 3.6  CL 104  CO2 21*  GLUCOSE 134*  BUN 11  CREATININE 0.89  CALCIUM 8.2*   PT/INR No results for input(s): "LABPROT", "INR" in the last 72 hours. CMP     Component Value Date/Time   NA 137 09/18/2023 0703   K 3.6 09/18/2023 0703   CL 104 09/18/2023 0703   CO2 21 (L) 09/18/2023 0703   GLUCOSE 134 (H) 09/18/2023 0703   BUN 11 09/18/2023 0703   CREATININE 0.89 09/18/2023 0703   CALCIUM 8.2 (L) 09/18/2023 0703   PROT 5.6 (L) 09/09/2023 0456   ALBUMIN 2.4 (L) 09/13/2023 0326   AST 13 (L) 09/09/2023 0456   ALT 17 09/09/2023 0456   ALKPHOS 65 09/09/2023 0456   BILITOT 0.4 09/09/2023 0456   GFRNONAA >60 09/18/2023 0703    Lipase  No results found for: "LIPASE"     Studies/Results: No results found.  Anti-infectives: Anti-infectives (From admission, onward)    Start     Dose/Rate Route Frequency Ordered Stop   09/13/23 1300  amoxicillin-clavulanate (AUGMENTIN) 875-125 MG per tablet 1 tablet        1 tablet Oral Every 12 hours 09/12/23 1134 09/15/23 2129   09/12/23 1230  ceFAZolin (ANCEF) IVPB 2g/100 mL premix  Status:  Discontinued        2 g 200 mL/hr over 30 Minutes Intravenous Every 8 hours 09/12/23 1140 09/12/23 1930   09/12/23 0945  vancomycin (VANCOCIN) IVPB 1000 mg/200 mL premix        1,000 mg 200 mL/hr over 60 Minutes Intravenous To Surgery 09/12/23 0933 09/13/23 0945   09/12/23 0935  vancomycin (VANCOCIN) 1-5 GM/200ML-% IVPB       Note to Pharmacy: Isabel Caprice, Destiny: cabinet override      09/12/23 0935 09/12/23 1041   09/12/23 0915  ceFAZolin (ANCEF) IVPB 2g/100 mL premix  Status:  Discontinued        2 g 200 mL/hr over 30 Minutes Intravenous On call to O.R. 09/12/23 0902 09/12/23 1125   09/09/23 1300  Ampicillin-Sulbactam (UNASYN) 3 g in sodium chloride 0.9 % 100 mL IVPB  3 g 200 mL/hr over 30 Minutes Intravenous Every 6 hours 09/09/23 1204 09/13/23 0721   09/09/23 1300  doxycycline (VIBRA-TABS) tablet 100 mg        100 mg Oral Every 12 hours 09/09/23 1204 09/15/23 2129   09/08/23 2000  linezolid (ZYVOX) IVPB 600 mg  Status:  Discontinued        600 mg 300 mL/hr over 60 Minutes Intravenous Every 12 hours 09/08/23 1117 09/09/23 1204   09/07/23 0800  vancomycin (VANCOREADY) IVPB 750 mg/150 mL  Status:  Discontinued        750 mg 150 mL/hr over 60 Minutes Intravenous Every 12 hours 09/06/23 1834 09/08/23 1117   09/06/23 1930  vancomycin (VANCOCIN) IVPB 1000 mg/200 mL premix        1,000 mg 200 mL/hr over 60 Minutes Intravenous STAT 09/06/23 1834 09/06/23 2300   09/06/23 1930  piperacillin-tazobactam (ZOSYN) IVPB 3.375 g  Status:  Discontinued        3.375 g 12.5 mL/hr over 240  Minutes Intravenous Every 8 hours 09/06/23 1834 09/09/23 1204        Assessment/Plan  Perirectal/gluteal abscess - S/P I&D at [facility] - drain removed 3/11 - mild erythema right around opening and minimal purulent drainage but cavity open and able to drain  - recommend sitz/perineal care after BMs to keep area clean  - completed abx per ID  Constipation - having more bowel function but reports hard stools still  - check KUB this AM to evaluate stool burden which might explain ongoing rectal pain    FEN: HH/CM diet VTE: LMWH 60 mg BID ID: completed abx per ID   - Per TRH/urology -  Emphysematous cystitis - foley per urology/TRH, cysto was negative on 3/3 IDDM diabetic foot ulcers and hx of recent amputation 4 weeks ago - Dr. Lajoyce Corners following and s/p amputation of toes bilaterally  PAD chronic systolic CHF HTN pulmonary HTN A.Fib/flutter   LOS: 12 days   I reviewed hospitalist notes, last 24 h vitals and pain scores, last 48 h intake and output, last 24 h labs and trends, and last 24 h imaging results.  This care required moderate level of medical decision making.    Juliet Rude, [facility] 09/18/2023, 11:20 AM Please see Amion for pager number during day hours 7:00am-4:30pm

## 2023-09-18 NOTE — Progress Notes (Signed)
 Progress Note   Patient: Henry Bryant WUJ:[id number] DOB: [birth year] DOA: 09/06/2023     12 DOS: the patient was seen and examined on 09/18/2023   Brief hospital course: 63yo with h/o DM, chronic HRrEF, HTN, pulmonary HTN, afib, recent B toe amputation, and urinary retention with self-catheterization who presented on 3/1 with rectal pain and purulent discharge.  He was found to have a rectal abscess with Klebsiella pneumoniae bacteremia (also UTI) s/p I&D (OSH); surgery has consulted and he has completed antibiotic therapy.  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  Wound vacs are in place.  He is medically stable and awaiting SNF placement.  Assessment and Plan:  Sepsis due to rectal abscess Emphysematous cystitis, Klebsiella UTI Klebsiella pneumoniae bacteremia -Imaging at [facility] had shown large loculated collection in the left buttocks and perirectal tissues, underwent I&D there, catheter was placed into the abscess cavity to help facilitate drainage. -CT abdomen 09/08/2023 revealed significant improvement in the left perirectal and left buttock region compared to prior exam.  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 -Seen by surgery, Dr. Dwain Sarna, recommended discharge on 10 days of antibiotics and follow-up in [facility] -Patient was on IV Unasyn -> Augmentin and doxycycline.  Patient has completed antibiotics.   -Blood cultures at [facility] positive for Klebsiella pneumonia, urine culture here on 3/1 positive for Klebsiella -Seen by urology on 09/08/2023, Dr. Lafonda Mosses.  Urology team recommended continuing Foley catheter -Ongoing rectal pain noted -Surgery reconsulted, unremarkable evaluation -needs better bowel regimen   Diabetic ulcer of toes, osteomyelitis bilateral first metatarsals -Prior toe amputation at [facility]R in 08/2023.   -MRI of the toes showed prior amputation of the great toe with osteomyelitis  of the distal half of the first metatarsal on the left and right, no abscess.  -Vascular surgery requested orthopedic opinion.   -Seen by Dr. Lajoyce Corners, recommended first ray and second toe amputation bilaterally, and dressing changes to both knees -Patient underwent surgery on 09/12/2023 -B wound vacs remain in place -R foot with increasing drainage and new pain overnight -Orthopedics consulted for reassessment   Peripheral arterial disease with history of revascularization -Followed by vascular surgery outpatient, last saw Dr. Hetty Blend, underwent lower extremity angiogram with revascularization in 06/2023  -Given poor healing and findings of osteomyelitis, vascular surgery recommended orthopedics consult   Insulin-dependent diabetes mellitus, uncontrolled with with hyperglycemia -hemoglobin A1c 6.8 on 09/06/2023, good control -Continue Lantus 30 units at bedtime -Hold Jardiance -SSI moderate  -Liberalized diet at request of pharmacy   Chronic systolic CHF -euvolemic -Continue Toprol-XL, low-dose Entresto, Crestor  -Hold spironolactone   Atrial flutter -Rate controlled with amiodarone, metoprolol -Resume Eliquis   Anxiety -Continue alprazolam   Urinary retention -Has been doing self-cath q5h at home -Currently has a foley, which urology recommends leaving in -Continue tamsulosin       Consultants: Surgery Urology Vascular surgery ID   Procedures: Amputations of B 1st rays and 2nd ray of R foot, 3/7   Antibiotics: Unasyn -> Augmentin x 7 days Cefazolin x 1 dose Doxycycline x 7 days Zosyn x 3 days Vancomycin x 3 days    30 Day Unplanned Readmission Risk Score    Flowsheet Row Admission (Current) from 09/06/2023 in [facility]  SURGICAL  30 Day Unplanned Readmission Risk Score (%) 16.29 Filed at 09/18/2023 0801       This score is the patient's risk of an  unplanned readmission within 30 days of being discharged (0 -100%). The score is based on  dignosis, age, lab data, medications, orders, and past utilization.   Low:  0-14.9   Medium: 15-21.9   High: 22-29.9   Extreme: 30 and above           Subjective: Ongoing rectal pain, which is unusual this far out - will ask surgery to see again.  Has severe R foot pain last night with increased wound vac drainage - will ask ortho to see.  Wants rehab at the [facility] in [place], if possible - he reports that they previously accepted him.   Objective: Vitals:   09/18/23 0517 09/18/23 0750  BP: (!) 114/43 (!) 107/58  Pulse: 65 71  Resp: 16 17  Temp: 98 F (36.7 C) 97.8 F (36.6 C)  SpO2: 98% 100%    Intake/Output Summary (Last 24 hours) at 09/18/2023 0835 Last data filed at 09/18/2023 0300 Gross per 24 hour  Intake 833.83 ml  Output 550 ml  Net 283.83 ml   Filed Weights   09/06/23 1751 09/12/23 0859  Weight: 63.8 kg 63.5 kg    Exam:  General:  Appears calm and comfortable and is in NAD Eyes:  EOMI, normal lids, iris ENT:  grossly normal hearing, lips & tongue, mmm Neck:  no LAD, masses or thyromegaly Cardiovascular:  RRR, no m/r/g. No LE edema.  Respiratory:   CTA bilaterally with no wheezes/rales/rhonchi.  Normal respiratory effort. Abdomen:  soft, NT, ND Skin:  s/p B toe/ray amputations with B wound vacs in place, sanguinous drainage greater on R Musculoskeletal:  grossly normal tone BUE/BLE, good ROM, as above Psychiatric:  blunted mood and affect, speech fluent and appropriate, AOx3 Neurologic:  CN 2-12 grossly intact, moves all extremities in coordinated fashion  Data Reviewed: I have reviewed the patient's lab results since admission.  Pertinent labs for today include:   CO2 21 Glucose 134 WBC 8.5 Hgb 8.3     Family Communication: None present  Disposition: Status is: Inpatient Remains inpatient appropriate because: awaiting placement     Time spent: 50 minutes  Unresulted Labs (From admission, onward)    None        Author: Jonah Blue, MD 09/18/2023 8:35 AM  For on call review [email or web address].

## 2023-09-18 NOTE — Progress Notes (Signed)
 Patient ID: Henry Bryant, male   DOB: [birth year], 63 y.o.   MRN: [id number]   LOS: 12 days   Subjective: Had burning pain of the right foot last night but resolved now.   Objective: Vital signs in last 24 hours: Temp:  [97.6 F (36.4 C)-98.7 F (37.1 C)] 97.8 F (36.6 C) (03/13 0750) Pulse Rate:  [65-73] 71 (03/13 0954) Resp:  [16-18] 17 (03/13 0750) BP: (101-136)/(43-58) 101/44 (03/13 0954) SpO2:  [98 %-100 %] 100 % (03/13 0750) Last BM Date : 09/16/23   Laboratory  CBC Recent Labs    09/17/23 0617 09/18/23 0703  WBC 8.8 8.5  HGB 8.2* 8.3*  HCT 25.7* 26.3*  PLT 417* 405*   BMET Recent Labs    09/18/23 0703  NA 137  K 3.6  CL 104  CO2 21*  GLUCOSE 134*  BUN 11  CREATININE 0.89  CALCIUM 8.2*     Physical Exam General appearance: alert and no distress Feet -- VAC's removed. Right foot with some dehiscence but no discharge or odor. Left foot C/D/I.   Assessment/Plan: S/p bilateral amputation -- Dry dressings for now. Will need to keep eye on right foot.    Freeman Caldron, PA-C Orthopedic Surgery [phone] 09/18/2023

## 2023-09-18 NOTE — Plan of Care (Signed)
  Problem: Pain Managment: Goal: General experience of comfort will improve and/or be controlled Outcome: Progressing   Problem: Safety: Goal: Ability to remain free from injury will improve Outcome: Progressing   Problem: Skin Integrity: Goal: Risk for impaired skin integrity will decrease Outcome: Progressing   Problem: Education: Goal: Knowledge of General Education information will improve Description: Including pain rating scale, medication(s)/side effects and non-pharmacologic comfort measures Outcome: Progressing

## 2023-09-19 DIAGNOSIS — K611 Rectal abscess: Secondary | ICD-10-CM | POA: Diagnosis not present

## 2023-09-19 LAB — GLUCOSE, CAPILLARY
Glucose-Capillary: 172 mg/dL — ABNORMAL HIGH (ref 70–99)
Glucose-Capillary: 174 mg/dL — ABNORMAL HIGH (ref 70–99)
Glucose-Capillary: 206 mg/dL — ABNORMAL HIGH (ref 70–99)
Glucose-Capillary: 182 mg/dL — ABNORMAL HIGH (ref 70–99)

## 2023-09-19 MED ORDER — MAGNESIUM HYDROXIDE 400 MG/5ML PO SUSP
15.0000 mL | Freq: Once | ORAL | Status: AC
Start: 2023-09-19 — End: 2023-09-19
  Administered 2023-09-19: 15 mL via ORAL
  Filled 2023-09-19: qty 30

## 2023-09-19 MED ORDER — FLEET ENEMA RE ENEM
1.0000 | ENEMA | Freq: Once | RECTAL | Status: AC
  Administered 2023-09-19: 1 via RECTAL
  Filled 2023-09-19: qty 1

## 2023-09-19 NOTE — NC FL2 (Signed)
 [place] MEDICAID FL2 LEVEL OF CARE FORM     IDENTIFICATION  Patient Name: Henry Bryant Birthdate: Nov 17, 1960 Sex: male Admission Date (Current Location): 09/06/2023  [facility] and [place] Number:  Producer, television/film/video and Address:  [facility], [address] [place], [place] [address]      Provider Number: [id number]  Attending Physician Name and Address:  Jonah Blue, MD  Relative Name and Phone Number:  Rowen Wilmer; Spouse; [phone]    Current Level of Care: Hospital Recommended Level of Care: Skilled Nursing Facility Prior Approval Number:    Date Approved/Denied:   PASRR Number: [id number]  Discharge Plan: SNF    Current Diagnoses: Patient Active Problem List   Diagnosis Date Noted   Bacteremia due to Klebsiella pneumoniae 09/10/2023   Sepsis due to Klebsiella (HCC) 09/10/2023   Uncontrolled type 2 diabetes mellitus with hyperglycemia, without long-term current use of insulin (HCC) 09/10/2023   DNR (do not resuscitate) 09/10/2023   Subacute osteomyelitis of foot (HCC) 09/10/2023   Peripheral artery disease (HCC) 09/10/2023   Emphysematous cystitis 09/09/2023   Rectal abscess 09/06/2023   Ambulatory dysfunction 07/24/2023   Dyslipidemia 07/17/2023   BPH with urinary obstruction 07/17/2023   Recurrent UTI 07/17/2023   Renal lesion 07/17/2023   Diabetic ulcer of right great toe (HCC) 06/26/2023   Systolic CHF (HCC) 06/13/2023   Age-related physical debility 09/23/2022   Diabetes (HCC) 09/23/2022   Diabetic ulcer of left heel associated with type 2 diabetes mellitus (HCC) 03/31/2020   Chronic systolic CHF (congestive heart failure) (HCC) - 20-25% by echo 06-2023 at [facility] 07/28/2019   Atypical atrial flutter (HCC) 07/19/2019   Cardiomyopathy (HCC) 12/02/2018   Coronary artery disease 12/02/2018   Dysphonia 11/18/2016   Essential hypertension 05/16/2015   Cigarette smoker 05/03/2015   Dyspnea 05/02/2015   Pulmonary  hypertension due to left heart disease (HCC) 07/05/2012   Heart disease 07/05/2012   COPD GOLD II with reversibility  02/13/2012   LV dysfunction 12/14/2011   Chronic pulmonary heart disease (HCC) 12/14/2011    Orientation RESPIRATION BLADDER Height & Weight     Self, Time, Situation, Place  Normal (Room Air) Continent, Indwelling catheter Weight: 140 lb (63.5 kg) Height:  5\' 10"  (177.8 cm)  BEHAVIORAL SYMPTOMS/MOOD NEUROLOGICAL BOWEL NUTRITION STATUS      Continent Diet (Please see dc summary)  AMBULATORY STATUS COMMUNICATION OF NEEDS Skin   Extensive Assist Verbally Surgical wounds, Other (Comment) (Wound / Incision (Open or Dehisced) 09/06/23 R and L Knee Anteriors; Wound / Incision (Open or Dehisced) 09/06/23 Amputation Toe R and L Anteriors; Wound / Incision (Open or Dehisced) 09/06/23 Buttocks Left; Incision (Closed) 09/12/23 Foot)                       Personal Care Assistance Level of Assistance  Bathing, Feeding, Dressing Bathing Assistance: Limited assistance Feeding assistance: Limited assistance Dressing Assistance: Limited assistance     Functional Limitations Info  Sight Sight Info: Impaired (R and L)        SPECIAL CARE FACTORS FREQUENCY  PT (By licensed PT), OT (By licensed OT)     PT Frequency: 5x OT Frequency: 5x            Contractures Contractures Info: Not present    Additional Factors Info  Code Status, Allergies, Insulin Sliding Scale, Isolation Precautions Code Status Info: DNR- Pre-Arrest Interventions Desired Allergies Info: Avelox (moxifloxacin Hcl In Nacl); Codeine; Meclizine; Potassium  Insulin Sliding Scale Info: Please see dc summary Isolation Precautions Info: Contact precautions (VRE, MRSA)     Current Medications (09/19/2023):  This is the current hospital active medication list Current Facility-Administered Medications  Medication Dose Route Frequency Provider Last Rate Last Admin   0.9 %  sodium chloride infusion    Intravenous Continuous Nadara Mustard, MD 10 mL/hr at 09/13/23 0109 Infusion Verify at 09/13/23 0109   acetaminophen (TYLENOL) tablet 650 mg  650 mg Oral Q6H PRN Nadara Mustard, MD   650 mg at 09/07/23 1543   Or   acetaminophen (TYLENOL) suppository 650 mg  650 mg Rectal Q6H PRN Nadara Mustard, MD       ALPRAZolam Prudy Feeler) tablet 0.5 mg  0.5 mg Oral QHS Nadara Mustard, MD   0.5 mg at 09/18/23 2206   amiodarone (PACERONE) tablet 200 mg  200 mg Oral Daily Nadara Mustard, MD   200 mg at 09/19/23 0900   apixaban (ELIQUIS) tablet 5 mg  5 mg Oral BID Jonah Blue, MD   5 mg at 09/19/23 0900   ascorbic acid (VITAMIN C) tablet 1,000 mg  1,000 mg Oral Daily Nadara Mustard, MD   1,000 mg at 09/19/23 0900   bisacodyl (DULCOLAX) suppository 10 mg  10 mg Rectal Daily PRN Juliet Rude, PA-C       Chlorhexidine Gluconate Cloth 2 % PADS 6 each  6 each Topical Daily Nadara Mustard, MD   6 each at 09/19/23 0901   docusate sodium (COLACE) capsule 100 mg  100 mg Oral BID Nadara Mustard, MD   100 mg at 09/19/23 0900   fentaNYL (SUBLIMAZE) injection 25 mcg  25 mcg Intravenous Q4H PRN Nadara Mustard, MD   25 mcg at 09/18/23 2211   insulin aspart (novoLOG) injection 0-15 Units  0-15 Units Subcutaneous TID WC Nadara Mustard, MD   3 Units at 09/19/23 0901   insulin aspart (novoLOG) injection 0-5 Units  0-5 Units Subcutaneous QHS Nadara Mustard, MD   3 Units at 09/14/23 2153   insulin glargine (LANTUS) injection 30 Units  30 Units Subcutaneous QHS Nadara Mustard, MD   30 Units at 09/18/23 2215   leptospermum manuka honey (MEDIHONEY) paste 1 Application  1 Application Topical Daily Nadara Mustard, MD   1 Application at 09/19/23 0902   magnesium hydroxide (MILK OF MAGNESIA) suspension 15 mL  15 mL Oral Once Fritzi Mandes, MD       metoprolol succinate (TOPROL-XL) 24 hr tablet 25 mg  25 mg Oral Daily Nadara Mustard, MD   25 mg at 09/19/23 0900   mupirocin ointment (BACTROBAN) 2 %   Nasal BID Nadara Mustard, MD   Given  at 09/19/23 0901   nutrition supplement (JUVEN) (JUVEN) powder packet 1 packet  1 packet Oral BID BM Nadara Mustard, MD   1 packet at 09/19/23 0900   ondansetron (ZOFRAN) tablet 4 mg  4 mg Oral Q6H PRN Nadara Mustard, MD       Or   ondansetron [facility]) injection 4 mg  4 mg Intravenous Q6H PRN Nadara Mustard, MD       oxyCODONE (Oxy IR/ROXICODONE) immediate release tablet 5-10 mg  5-10 mg Oral Q4H PRN Berton Mount I, MD   10 mg at 09/18/23 1737   pantoprazole (PROTONIX) EC tablet 40 mg  40 mg Oral QHS Nadara Mustard, MD   40 mg at 09/18/23 2206  pantoprazole (PROTONIX) EC tablet 40 mg  40 mg Oral Daily Nadara Mustard, MD   40 mg at 09/19/23 0900   polyethylene glycol (MIRALAX / GLYCOLAX) packet 17 g  17 g Oral BID Sophronia Simas L, MD   17 g at 09/19/23 0900   rosuvastatin (CRESTOR) tablet 40 mg  40 mg Oral Daily Nadara Mustard, MD   40 mg at 09/19/23 0901   sacubitril-valsartan (ENTRESTO) 24-26 mg per tablet  1 tablet Oral BID Nadara Mustard, MD   1 tablet at 09/19/23 0901   senna-docusate (Senokot-S) tablet 1 tablet  1 tablet Oral BID Berton Mount I, MD   1 tablet at 09/19/23 0900   sodium phosphate (FLEET) enema 1 enema  1 enema Rectal Once Fritzi Mandes, MD       tamsulosin [facility]) capsule 0.4 mg  0.4 mg Oral Daily Nadara Mustard, MD   0.4 mg at 09/19/23 1478   zinc sulfate (50mg  elemental zinc) capsule 220 mg  220 mg Oral Daily Nadara Mustard, MD   220 mg at 09/19/23 0900     Discharge Medications: Please see discharge summary for a list of discharge medications.  Relevant Imaging Results:  Relevant Lab Results:   Additional Information SS# [id number]  Marliss Coots, LCSW

## 2023-09-19 NOTE — Progress Notes (Signed)
 Progress Note   Patient: Henry Bryant ZOX:[id number] DOB: December 28, 1960 DOA: 09/06/2023     13 DOS: the patient was seen and examined on 09/19/2023   Brief hospital course: 62yo with h/o DM, chronic HRrEF, HTN, pulmonary HTN, afib, recent B toe amputation, and urinary retention with self-catheterization who presented on 3/1 with rectal pain and purulent discharge.  He was found to have a rectal abscess with Klebsiella pneumoniae bacteremia (also UTI) s/p I&D (OSH); surgery has consulted and he has completed antibiotic therapy.  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  Wound vacs are in place.  He is medically stable and awaiting SNF placement.  Assessment and Plan:  Patient is medically stable for discharge.  He has [place] Medicaid and has been declined for all facilities in [facility]  He will likely need a facility in [place] once he is in agreement and has insurance approval.   Sepsis due to rectal abscess Emphysematous cystitis, Klebsiella UTI Klebsiella pneumoniae bacteremia -Imaging at [facility] had shown large loculated collection in the left buttocks and perirectal tissues, underwent I&D there, catheter was placed into the abscess cavity to help facilitate drainage. -CT abdomen 09/08/2023 revealed significant improvement in the left perirectal and left buttock region compared to prior exam.  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 -Seen by surgery, Dr. Dwain Sarna, recommended discharge on 10 days of antibiotics and follow-up in [facility] with [facility] -Patient was on IV Unasyn -> Augmentin and doxycycline.  Patient has completed antibiotics.   -Blood cultures at [facility] positive for Klebsiella pneumonia, urine culture here on 3/1 positive for Klebsiella -Seen by urology on 09/08/2023, Dr. Lafonda Mosses.  Urology team recommended continuing Foley catheter -Ongoing rectal pain noted -Surgery reconsulted, unremarkable  evaluation, likely worsened by constipation -needs better bowel regimen -Surgery has signed off at this time   Diabetic ulcer of toes, osteomyelitis bilateral first metatarsals -Prior toe amputation at [facility] in 08/2023.   -MRI of the toes showed prior amputation of the great toe with osteomyelitis of the distal half of the first metatarsal on the left and right, no abscess.  -Vascular surgery requested orthopedic opinion.   -Seen by Dr. Lajoyce Corners, recommended first ray and second toe amputation bilaterally, and dressing changes to both knees -Patient underwent surgery on 09/12/2023 -B wound vacs removed on 3/13 due to R foot with increasing drainage and new pain overnight -Orthopedics consulted for reassessment -Some wound dehiscence noted on the R and will need to be closely monitored   Peripheral arterial disease with history of revascularization -Followed by vascular surgery outpatient, last saw Dr. Hetty Blend, underwent lower extremity angiogram with revascularization in 06/2023  -Given poor healing and findings of osteomyelitis, vascular surgery recommended orthopedics consult on admission and he underwent amputation   Insulin-dependent diabetes mellitus, uncontrolled with with hyperglycemia -hemoglobin A1c 6.8 on 09/06/2023, good control -Continue Lantus 30 units at bedtime -Hold Jardiance -SSI moderate  -Liberalized diet at request of pharmacy   Chronic systolic CHF -euvolemic -Continue Toprol-XL, low-dose Entresto, Crestor  -Hold spironolactone   Atrial flutter -Rate controlled with amiodarone, metoprolol -Resume Eliquis   Anxiety -Continue alprazolam   Urinary retention -Has been doing self-cath q5h at home -Currently has a foley, which urology recommends leaving in -Continue tamsulosin       Consultants: Surgery Urology Vascular surgery ID   Procedures: Amputations of B 1st rays and 2nd ray of R foot, 3/7   Antibiotics: Unasyn ->  Augmentin x 7 days Cefazolin x 1  dose Doxycycline x 7 days Zosyn x 3 days Vancomycin x 3 days  30 Day Unplanned Readmission Risk Score    Flowsheet Row Admission (Current) from 09/06/2023 in [facility]  SURGICAL  30 Day Unplanned Readmission Risk Score (%) 16.53 Filed at 09/19/2023 0801       This score is the patient's risk of an unplanned readmission within 30 days of being discharged (0 -100%). The score is based on dignosis, age, lab data, medications, orders, and past utilization.   Low:  0-14.9   Medium: 15-21.9   High: 22-29.9   Extreme: 30 and above           Subjective: Still having rectal pain.  R foot pain has resolved at this time.  Not enthusiastic about SNF in [place].   Objective: Vitals:   09/19/23 0724 09/19/23 1552  BP: (!) 107/44 (!) 107/40  Pulse: 77 67  Resp: 16 16  Temp: 98.4 F (36.9 C) 97.7 F (36.5 C)  SpO2: 100% 99%   No intake or output data in the 24 hours ending 09/19/23 1618  Filed Weights   09/06/23 1751 09/12/23 0859  Weight: 63.8 kg 63.5 kg    Exam:  General:  Appears calm and comfortable and is in NAD Eyes:  EOMI, normal lids, iris ENT:  grossly normal hearing, lips & tongue, mmm Neck:  no LAD, masses or thyromegaly Cardiovascular:  RRR, no m/r/g. No LE edema.  Respiratory:   CTA bilaterally with no wheezes/rales/rhonchi.  Normal respiratory effort. Abdomen:  soft, NT, ND Skin:  s/p B toe/ray amputations with B wound vacs in place, sanguinous drainage greater on R Musculoskeletal:  grossly normal tone BUE/BLE, good ROM, as above Psychiatric:  blunted mood and affect, speech fluent and appropriate, AOx3 Neurologic:  CN 2-12 grossly intact, moves all extremities in coordinated fashion  Data Reviewed: I have reviewed the patient's lab results since admission.  Pertinent labs for today include:   None     Family Communication: None present; I spoke with his wife by telephone  Disposition: Status is: Inpatient Remains  inpatient appropriate because: unsafe disposition     Time spent: 50 minutes  Unresulted Labs (From admission, onward)     Start     Ordered   09/20/23 0500  CBC with Differential/Platelet  Tomorrow morning,   R       Question:  Specimen collection method  Answer:  Lab=Lab collect   09/19/23 0829   09/20/23 0500  Basic metabolic panel  Tomorrow morning,   R       Question:  Specimen collection method  Answer:  Lab=Lab collect   09/19/23 1610             Author: Jonah Blue, MD 09/19/2023 4:18 PM  For on call review [email or web address].

## 2023-09-19 NOTE — TOC Progression Note (Addendum)
 Transition of Care [facility]) - Progression Note    Patient Details  Name: Henry Bryant MRN: [id number] Date of Birth: 16-Jun-1961  Transition of [facility]) CM/SW Contact  Marliss Coots, LCSW Phone Number: 09/19/2023, 9:11 AM  Clinical Narrative:     9:11 AM CSW faxed out referrals to other SNFs in [place] in efforts to obtain bed offers.  1:07 PM CSW spoke with patient regarding SNFs and disability. Patient confirmed he receives approximately $900 a month (SSI) and expressed interest in SNFs near [place] areas.  2:18 PM CSW relayed to patient that closest SNF options to [facility] are in [place] (all [facility] [facility] SNFs denied). Patient said he would speak with wife and follow up.   2:25 PM Patient called CSW who stated that he would decide whether to discharge to SNF in [facility].  Expected Discharge Plan: Skilled Nursing Facility Barriers to Discharge: Continued Medical Work up  Expected Discharge Plan and Services In-house Referral: Clinical Social Work     Living arrangements for the past 2 months: Single Family Home                                       Social Determinants of Health (SDOH) Interventions SDOH Screenings   Food Insecurity: No Food Insecurity (09/06/2023)  Recent Concern: Food Insecurity - Food Insecurity Present (08/11/2023)   Received from [facility]  Housing: Low Risk  (09/06/2023)  Transportation Needs: No Transportation Needs (09/06/2023)  Utilities: Not At Risk (09/06/2023)  Financial Resource Strain: Low Risk  (08/11/2023)   [facility] from [facility]  Physical Activity: Inactive (08/11/2023)   Received from [facility]  Social Connections: Socially Integrated (08/11/2023)   Received from [facility]  Stress: No Stress Concern Present (08/11/2023)   Received from [facility]  Tobacco Use: Medium Risk (09/12/2023)  Health Literacy: Medium Risk (08/11/2023)   Received from [facility]    Readmission  Risk Interventions     No data to display

## 2023-09-19 NOTE — Plan of Care (Signed)
   Problem: Education: Goal: Knowledge of General Education information will improve Description Including pain rating scale, medication(s)/side effects and non-pharmacologic comfort measures Outcome: Progressing

## 2023-09-19 NOTE — Progress Notes (Signed)
 Progress Note  7 Days Post-Op  Subjective: No acute changes. Has not had a BM in several days.  Objective: Vital signs in last 24 hours: Temp:  [98 F (36.7 C)-98.4 F (36.9 C)] 98.4 F (36.9 C) (03/14 0724) Pulse Rate:  [71-77] 77 (03/14 0724) Resp:  [15-16] 16 (03/14 0724) BP: (100-107)/(40-56) 107/44 (03/14 0724) SpO2:  [98 %-100 %] 100 % (03/14 0724) Last BM Date : 09/16/23  Intake/Output from previous day: 03/13 0701 - 03/14 0700 In: 240 [P.O.:240] Out: 700 [Urine:700] Intake/Output this shift: No intake/output data recorded.  PE: General: resting in bed, NAD Lungs: Respiratory effort nonlabored on room air GU: left gluteal wound is closing with no surrounding erythema or induration, no fluctuance, no tenderness to palpation. No external hemorrhoids. MS: VAC to BLE Psych: A&Ox3 with an appropriate affect.   Lab Results:  Recent Labs    09/17/23 0617 09/18/23 0703  WBC 8.8 8.5  HGB 8.2* 8.3*  HCT 25.7* 26.3*  PLT 417* 405*   BMET Recent Labs    09/18/23 0703  NA 137  K 3.6  CL 104  CO2 21*  GLUCOSE 134*  BUN 11  CREATININE 0.89  CALCIUM 8.2*   PT/INR No results for input(s): "LABPROT", "INR" in the last 72 hours. CMP     Component Value Date/Time   NA 137 09/18/2023 0703   K 3.6 09/18/2023 0703   CL 104 09/18/2023 0703   CO2 21 (L) 09/18/2023 0703   GLUCOSE 134 (H) 09/18/2023 0703   BUN 11 09/18/2023 0703   CREATININE 0.89 09/18/2023 0703   CALCIUM 8.2 (L) 09/18/2023 0703   PROT 5.6 (L) 09/09/2023 0456   ALBUMIN 2.4 (L) 09/13/2023 0326   AST 13 (L) 09/09/2023 0456   ALT 17 09/09/2023 0456   ALKPHOS 65 09/09/2023 0456   BILITOT 0.4 09/09/2023 0456   GFRNONAA >60 09/18/2023 0703   Lipase  No results found for: "LIPASE"     Studies/Results: DG Abd Portable 1V Result Date: 09/18/2023 CLINICAL DATA:  Fecal impaction EXAM: PORTABLE ABDOMEN - 1 VIEW COMPARISON:  X-ray 08/29/2023.  CT 09/08/2023. FINDINGS: Moderate colonic stool  along the left side. The large bowel is nondilated. There are numerous air-filled loops of small bowel mid abdomen. A few loops measure up to 3 cm, borderline dilated. Of note the extreme caudal aspect of the pelvis is clipped off the edge of the film. IMPRESSION: Borderline dilated air-filled loops of small bowel throughout the midabdomen. The large bowel has a normal caliber with some scattered moderate stool. Electronically Signed   By: Karen Kays M.D.   On: 09/18/2023 16:04    Assessment/Plan  Perirectal/gluteal abscess - S/P I&D at [facility] with drain placement into abscess cavity. Drain has been removed. - No exam findings to suggest a recurrent abscess. Wound is healing well. WBC remains normal and patient is afebrile. - recommend sitz/perineal care after BMs to keep area clean  - completed abx per ID - follow up with surgeon in [place] or PCP in 3-4 weeks for wound check - Patient needs an aggressive bowel regimen to treat and prevent constipation. He is agreeable to an enema today, which I have ordered. Will also give a dose of milk of magnesia. Continue BID miralax and senna/colace. - Surgery will sign off, please call with any further questions or concerns.    LOS: 13 days   I reviewed hospitalist notes, last 24 h vitals and pain scores, last 48 h intake and  output, last 24 h labs and trends, and last 24 h imaging results.  This care required straight-forward level of medical decision making.    Fritzi Mandes, MD [facility] 09/19/2023, 9:06 AM Please see Amion for pager number during day hours 7:00am-4:30pm

## 2023-09-19 NOTE — Progress Notes (Addendum)
 Physical Therapy Treatment Patient Details Name: Henry Bryant MRN: [id number] DOB: Oct 15, 1960 Today's Date: 09/19/2023   History of Present Illness 63 y.o. male presents to [facility] 09/06/23 from [facility] with rectal pain and purulent discharge. Hx of prior fall several days prior. Pt w/ sepsis 2/2 rectal abscess and emphysematous cystitis, s/p I&D at [facility]. Pt also has osteomyelitis of B first metatarsals with ulcers of first metatarsal head B. 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 PMHx: T2DM, chronic systolic CHF, hypertension, pulmonary hypertension, A-fib/flutter, recent bilateral toe amputation by podiatry about 4 weeks ago    PT Comments  Pt received in supine, agreeable to therapy session at bed-level, pt expressed frustration about post-acute rehab options and his insurance coverage. Pt redirected and receptive to discussion on transfer technique options with lateral scoot to drop arm BSC or chair and benefits of OOB mobility/increased mobility as able. Pt persistently refusing to attempt EOB/OOB mobility due to frequent loose stools after enema. RN/NT notified of safe technique to transfer him to drop arm chair/BSC if he agrees to get up later. Pt would benefit from air bed and Prevalon boots to protect his skin given wounds. DME below pending progress acutely, discussed with supervising PT Hilton Cork and medical plan of care. Patient will benefit from continued inpatient follow up therapy, <3 hours/day     If plan is discharge home, recommend the following: A little help with bathing/dressing/bathroom;Assistance with cooking/housework;Assist for transportation;Help with stairs or ramp for entrance;Two people to help with walking and/or transfers   Can travel by private vehicle     No  Equipment Recommendations  None recommended by PT (consider air bed, drop arm BSC and mechanical lift pending progress (if home directly from hospital))     Recommendations for Other Services       Precautions / Restrictions Precautions Precautions: Fall Recall of Precautions/Restrictions: Intact Precaution/Restrictions Comments: Contact; x2 wound vacs, catheter, drain from rectal abscess Required Braces or Orthoses: Other Brace Other Brace: B post op shoes (pt refused to accept post-op shoes from vendor, not in room; asked for MD to order Prevalon boots for him) Restrictions Weight Bearing Restrictions Per Provider Order: Yes RLE Weight Bearing Per Provider Order: Weight bearing as tolerated LLE Weight Bearing Per Provider Order: Weight bearing as tolerated Other Position/Activity Restrictions: WB on heels only for transfers, no gait training     Mobility  Bed Mobility Overal bed mobility: Needs Assistance Bed Mobility: Rolling Rolling: Supervision         General bed mobility comments: rolling to L and R with supervision, pillow placed for pressure relief behind his L hip; pt refused EOB    Transfers Overall transfer level: Needs assistance                 General transfer comment: pt refusing to attempt; drop arm BSC and chairs brought to his room by PTA and pt given visual/verbal demo on safe technique for transfer when he is agreeable. PTA discusses that nursing staff can help him get OOB to chair/drop arm BSC when he needs to toilet as well, needs to wear something on his feet for safety given wounds (Prevalon boots ordered for him to wear in bed by MD after session).    Ambulation/Gait               General Gait Details: pt heel WB only for transfers, gait N/A   Stairs  Wheelchair Mobility     Tilt Bed    Modified Rankin (Stroke Patients Only)       Balance Overall balance assessment:  (pt refused EOB/OOB)                                          Communication Communication Communication: No apparent difficulties  Cognition Arousal: Alert Behavior During  Therapy: WFL for tasks assessed/performed   PT - Cognitive impairments: No family/caregiver present to determine baseline, Safety/Judgement                       PT - Cognition Comments: Pt appears to have poor insight into need to participate more regularly in therapy sessions; he reports his daughter can help both he and his spouse with what they need at home. Daughter not present to confirm this. PTA reinforces needing to mobilize more in acute setting to build strength prior to DC home or post-acute rehab setting. Pt perseverating on insurance challenges. Following commands: Intact      Cueing Cueing Techniques: Verbal cues  Exercises Other Exercises Other Exercises: discussed BLE ROMAT in supine and seated LE ROM, pt would benefit from HEP handout next session to reinforce, plan to include chair push-ups and pressure relief info.    General Comments General comments (skin integrity, edema, etc.): discussion on pressure relief frequency/technique      Pertinent Vitals/Pain Pain Assessment Pain Assessment: Faces Faces Pain Scale: Hurts even more Pain Location: rectum, B feet Pain Descriptors / Indicators: Discomfort, Aching, Guarding Pain Intervention(s): Monitored during session, Limited activity within patient's tolerance, Repositioned (pt refusing OOB despite encouragement)    Home Living                          Prior Function            PT Goals (current goals can now be found in the care plan section) Acute Rehab PT Goals Patient Stated Goal: to go home PT Goal Formulation: With patient Time For Goal Achievement: 09/27/23 Progress towards PT goals: Not progressing toward goals - comment (pt refusing OOB frequently)    Frequency    Min 3X/week      PT Plan      Co-evaluation              AM-PAC PT "6 Clicks" Mobility   Outcome Measure  Help needed turning from your back to your side while in a flat bed without using bedrails?:  None Help needed moving from lying on your back to sitting on the side of a flat bed without using bedrails?: A Little Help needed moving to and from a bed to a chair (including a wheelchair)?: Total Help needed standing up from a chair using your arms (e.g., wheelchair or bedside chair)?: Total Help needed to walk in hospital room?: Total Help needed climbing 3-5 steps with a railing? : Total 6 Click Score: 11    End of Session   Activity Tolerance: Other (comment);Treatment limited secondary to medical complications (Comment) (self-limiting behaviors and pt c/o recent bowel urgency/frequent loose stools after enema; offered to work on lateral scoot to drop arm BSC but pt refusing) Patient left: in bed;with call bell/phone within reach;with bed alarm set Nurse Communication: Mobility status;Precautions;Weight bearing status;Other (comment) (he needs prevalon boots and air mattress delivered; drop arm  chair and BSC brought to his room he can scoot OOB to drop arm BSC) PT Visit Diagnosis: Other abnormalities of gait and mobility (R26.89);Muscle weakness (generalized) (M62.81)     Time: 2841-3244 PT Time Calculation (min) (ACUTE ONLY): 15 min  Charges:    $Therapeutic Activity: 8-22 mins PT General Charges $$ ACUTE PT VISIT: 1 Visit                     Eliese Kerwood P., PTA Acute Rehabilitation Services Secure Chat Preferred 9a-5:30pm Office: [phone]    Dorathy Kinsman [facility] 09/19/2023, 5:16 PM

## 2023-09-20 DIAGNOSIS — K611 Rectal abscess: Secondary | ICD-10-CM | POA: Diagnosis not present

## 2023-09-20 LAB — URINALYSIS, ROUTINE W REFLEX MICROSCOPIC
Bilirubin Urine: NEGATIVE
Glucose, UA: NEGATIVE mg/dL
Hgb urine dipstick: NEGATIVE
Ketones, ur: NEGATIVE mg/dL
Nitrite: NEGATIVE
Protein, ur: 30 mg/dL — AB
Specific Gravity, Urine: 1.014 (ref 1.005–1.030)
pH: 8 (ref 5.0–8.0)

## 2023-09-20 LAB — CBC WITH DIFFERENTIAL/PLATELET
Abs Immature Granulocytes: 0.03 10*3/uL (ref 0.00–0.07)
Basophils Absolute: 0 10*3/uL (ref 0.0–0.1)
Basophils Relative: 0 %
Eosinophils Absolute: 0.1 10*3/uL (ref 0.0–0.5)
Eosinophils Relative: 1 %
HCT: 26.4 % — ABNORMAL LOW (ref 39.0–52.0)
Hemoglobin: 8.5 g/dL — ABNORMAL LOW (ref 13.0–17.0)
Immature Granulocytes: 0 %
Lymphocytes Relative: 23 %
Lymphs Abs: 1.8 10*3/uL (ref 0.7–4.0)
MCH: 28.1 pg (ref 26.0–34.0)
MCHC: 32.2 g/dL (ref 30.0–36.0)
MCV: 87.1 fL (ref 80.0–100.0)
Monocytes Absolute: 0.4 10*3/uL (ref 0.1–1.0)
Monocytes Relative: 5 %
Neutro Abs: 5.6 10*3/uL (ref 1.7–7.7)
Neutrophils Relative %: 71 %
Platelets: 443 10*3/uL — ABNORMAL HIGH (ref 150–400)
RBC: 3.03 MIL/uL — ABNORMAL LOW (ref 4.22–5.81)
RDW: 15.7 % — ABNORMAL HIGH (ref 11.5–15.5)
WBC: 8 10*3/uL (ref 4.0–10.5)
nRBC: 0 % (ref 0.0–0.2)

## 2023-09-20 LAB — BASIC METABOLIC PANEL
Anion gap: 11 (ref 5–15)
BUN: 12 mg/dL (ref 8–23)
CO2: 21 mmol/L — ABNORMAL LOW (ref 22–32)
Calcium: 8.2 mg/dL — ABNORMAL LOW (ref 8.9–10.3)
Chloride: 105 mmol/L (ref 98–111)
Creatinine, Ser: 0.88 mg/dL (ref 0.61–1.24)
GFR, Estimated: >60 mL/min (ref >60)
Glucose, Bld: 150 mg/dL — ABNORMAL HIGH (ref 70–99)
Potassium: 3.9 mmol/L (ref 3.5–5.1)
Sodium: 137 mmol/L (ref 135–145)

## 2023-09-20 LAB — GLUCOSE, CAPILLARY
Glucose-Capillary: 141 mg/dL — ABNORMAL HIGH (ref 70–99)
Glucose-Capillary: 175 mg/dL — ABNORMAL HIGH (ref 70–99)
Glucose-Capillary: 231 mg/dL — ABNORMAL HIGH (ref 70–99)
Glucose-Capillary: 169 mg/dL — ABNORMAL HIGH (ref 70–99)

## 2023-09-20 NOTE — Progress Notes (Signed)
 Saw patient this evening on the floor. There was a small amount of bloody drainage on bilateral dressings.  Once the dressings were taken down, the wound edges were well-approximated with nylon sutures in place.  The wound on the right foot at the proximal aspect has not fully healed.  There is no expressible drainage or active drainage.  On the left side, the distal aspect had not completely healed and there was no active or expressible drainage.  His feet were rewrapped with gauze, ABDs and Ace wrap.  Will give the wounds more time to heal.  Since there is not any active or expressible drainage on exam and the edges appear well-approximated, do not recommend further operative intervention at this time.  Can continue with dressing changes as needed.  London Sheer, MD Orthopedic Surgeon

## 2023-09-20 NOTE — Progress Notes (Signed)
 PT Cancellation Note  Patient Details Name: MATHEW POSTIGLIONE MRN: [id number] DOB: Nov 28, 1960   Cancelled Treatment:    Reason Eval/Treat Not Completed: Pain limiting ability to participate.  Pt reports they moved him to an airbed and it is increasing his pain and he cannot participate today.     Olivia Canter 09/20/2023, 2:35 PM

## 2023-09-20 NOTE — Plan of Care (Signed)
  Problem: Clinical Measurements: Goal: Ability to maintain clinical measurements within normal limits will improve Outcome: Progressing Goal: Will remain free from infection Outcome: Progressing   Problem: Activity: Goal: Risk for activity intolerance will decrease Outcome: Progressing   Problem: Nutrition: Goal: Adequate nutrition will be maintained Outcome: Progressing   Problem: Pain Managment: Goal: General experience of comfort will improve and/or be controlled Outcome: Progressing   Problem: Safety: Goal: Ability to remain free from injury will improve Outcome: Progressing   Problem: Skin Integrity: Goal: Risk for impaired skin integrity will decrease Outcome: Progressing   Problem: Clinical Measurements: Goal: Ability to maintain clinical measurements within normal limits will improve Outcome: Progressing Goal: Will remain free from infection Outcome: Progressing

## 2023-09-20 NOTE — Plan of Care (Signed)
  Problem: Education: Goal: Knowledge of General Education information will improve Description: Including pain rating scale, medication(s)/side effects and non-pharmacologic comfort measures Outcome: Progressing   Problem: Clinical Measurements: Goal: Ability to maintain clinical measurements within normal limits will improve Outcome: Progressing Goal: Diagnostic test results will improve Outcome: Progressing Goal: Respiratory complications will improve Outcome: Progressing Goal: Cardiovascular complication will be avoided Outcome: Progressing   Problem: Activity: Goal: Risk for activity intolerance will decrease Outcome: Progressing   Problem: Nutrition: Goal: Adequate nutrition will be maintained Outcome: Progressing   Problem: Elimination: Goal: Will not experience complications related to bowel motility Outcome: Progressing Goal: Will not experience complications related to urinary retention Outcome: Progressing   Problem: Safety: Goal: Ability to remain free from injury will improve Outcome: Progressing   Problem: Coping: Goal: Ability to adjust to condition or change in health will improve Outcome: Progressing   Problem: Fluid Volume: Goal: Ability to maintain a balanced intake and output will improve Outcome: Progressing   Problem: Health Behavior/Discharge Planning: Goal: Ability to manage health-related needs will improve Outcome: Progressing   Problem: Metabolic: Goal: Ability to maintain appropriate glucose levels will improve Outcome: Progressing   Problem: Nutritional: Goal: Maintenance of adequate nutrition will improve Outcome: Progressing   Problem: Skin Integrity: Goal: Risk for impaired skin integrity will decrease Outcome: Progressing   Problem: Tissue Perfusion: Goal: Adequacy of tissue perfusion will improve Outcome: Progressing   Problem: Education: Goal: Knowledge of General Education information will improve Description:  Including pain rating scale, medication(s)/side effects and non-pharmacologic comfort measures Outcome: Progressing   Problem: Clinical Measurements: Goal: Ability to maintain clinical measurements within normal limits will improve Outcome: Progressing Goal: Diagnostic test results will improve Outcome: Progressing Goal: Respiratory complications will improve Outcome: Progressing Goal: Cardiovascular complication will be avoided Outcome: Progressing   Problem: Activity: Goal: Risk for activity intolerance will decrease Outcome: Progressing   Problem: Nutrition: Goal: Adequate nutrition will be maintained Outcome: Progressing   Problem: Elimination: Goal: Will not experience complications related to bowel motility Outcome: Progressing Goal: Will not experience complications related to urinary retention Outcome: Progressing   Problem: Pain Managment: Goal: General experience of comfort will improve and/or be controlled Outcome: Progressing   Problem: Safety: Goal: Ability to remain free from injury will improve Outcome: Progressing   Problem: Education: Goal: Ability to verbalize activity precautions or restrictions will improve Outcome: Progressing Goal: Understanding of discharge needs will improve Outcome: Progressing   Problem: Clinical Measurements: Goal: Postoperative complications will be avoided or minimized Outcome: Progressing   Problem: Self-Care: Goal: Ability to meet self-care needs will improve Outcome: Progressing   Problem: Pain Management: Goal: Pain level will decrease with appropriate interventions Outcome: Progressing

## 2023-09-20 NOTE — Progress Notes (Signed)
 Progress Note   Patient: Henry Bryant ZOX:[id number] DOB: 1960/09/22 DOA: 09/06/2023     14 DOS: the patient was seen and examined on 09/20/2023   Brief hospital course: 63yo with h/o DM, chronic HRrEF, HTN, pulmonary HTN, afib, recent B toe amputation, and urinary retention with self-catheterization who presented on 3/1 with rectal pain and purulent discharge.  He was found to have a rectal abscess with Klebsiella pneumoniae bacteremia (also UTI) s/p I&D (OSH); surgery has consulted and he has completed antibiotic therapy.  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  Wound vacs are in place.  He is medically stable and awaiting SNF placement.  Assessment and Plan:  Patient is medically stable for discharge.  He has [place] Medicaid and has been declined for all facilities in [facility].  He will likely need a facility in [place] and is in agreement, awaiting acceptance and insurance approval.     Sepsis due to rectal abscess Emphysematous cystitis, Klebsiella UTI Klebsiella pneumoniae bacteremia -Imaging at [facility] had shown large loculated collection in the left buttocks and perirectal tissues, underwent I&D there, catheter was placed into the abscess cavity to help facilitate drainage. -CT abdomen 09/08/2023 revealed significant improvement in the left perirectal and left buttock region compared to prior exam.  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 -Seen by surgery, Dr. Dwain Sarna, recommended discharge on 10 days of antibiotics and follow-up in [facility] with [facility] surgeon -Patient was on IV Unasyn -> Augmentin and doxycycline.  Patient has completed antibiotics.   -Blood cultures at [facility] positive for Klebsiella pneumonia, urine culture here on 3/1 positive for Klebsiella -Seen by urology on 09/08/2023, Dr. Lafonda Mosses.  Urology team recommended continuing Foley catheter -Ongoing rectal pain noted -Surgery reconsulted,  unremarkable evaluation, likely worsened by constipation -needs better bowel regimen -Surgery has signed off at this time   Diabetic ulcer of toes, osteomyelitis bilateral first metatarsals -Prior toe amputation at [facility] -R in 08/2023.   -MRI of the toes showed prior amputation of the great toe with osteomyelitis of the distal half of the first metatarsal on the left and right, no abscess.  -Vascular surgery requested orthopedic opinion.   -Seen by Dr. Lajoyce Corners, recommended first ray and second toe amputation bilaterally, and dressing changes to both knees -Patient underwent surgery on 09/12/2023 -B wound vacs removed on 3/13 due to R foot with increasing drainage and new pain overnight -Orthopedics consulted for reassessment -Some wound dehiscence noted on the R and will now also on the L -Dr. Christell Constant is covering for Dr. Lajoyce Corners this weekend and will come by to assess this evening   Peripheral arterial disease with history of revascularization -Followed by vascular surgery outpatient, last saw Dr. Hetty Blend, underwent lower extremity angiogram with revascularization in 06/2023  -Given poor healing and findings of osteomyelitis, vascular surgery recommended orthopedics consult on admission and he underwent amputation   Insulin-dependent diabetes mellitus, uncontrolled with with hyperglycemia -hemoglobin A1c 6.8 on 09/06/2023, good control -Continue Lantus 30 units at bedtime -Hold Jardiance -SSI moderate  -Liberalized diet at request of pharmacy   Chronic systolic CHF -euvolemic -Continue Toprol-XL, low-dose Entresto, Crestor  -Hold spironolactone   Atrial flutter -Rate controlled with amiodarone, metoprolol -Resume Eliquis   Anxiety -Continue alprazolam   Urinary retention -Has been doing self-cath q5h at home -Currently has a foley, which urology recommends leaving in -Continue tamsulosin -Reports significant burning, will order UA and plan to increase tamsulosin dose if  UA is WNL        Consultants: Surgery Urology Vascular surgery ID   Procedures: Amputations of B 1st rays and 2nd ray of R foot, 3/7   Antibiotics: Unasyn -> Augmentin x 7 days Cefazolin x 1 dose Doxycycline x 7 days Zosyn x 3 days Vancomycin x 3 days   30 Day Unplanned Readmission Risk Score    Flowsheet Row Admission (Current) from 09/06/2023 in [facility]  SURGICAL  30 Day Unplanned Readmission Risk Score (%) 16.56 Filed at 09/20/2023 0400       This score is the patient's risk of an unplanned readmission within 30 days of being discharged (0 -100%). The score is based on dignosis, age, lab data, medications, orders, and past utilization.   Low:  0-14.9   Medium: 15-21.9   High: 22-29.9   Extreme: 30 and above           Subjective: Urinary burning.  Bed changed yesterday based on PT suggestion and patient hates it and wants his regular bed back.  He is now willing to go to SNF in [place] (TOC updated).   Objective: Vitals:   09/20/23 0558 09/20/23 0740  BP: (!) 115/59 113/65  Pulse: 68 67  Resp: 18 17  Temp: 98.2 F (36.8 C) 98 F (36.7 C)  SpO2: 99% 99%    Intake/Output Summary (Last 24 hours) at 09/20/2023 0743 Last data filed at 09/20/2023 0559 Gross per 24 hour  Intake 240 ml  Output 1550 ml  Net -1310 ml   Filed Weights   09/06/23 1751 09/12/23 0859  Weight: 63.8 kg 63.5 kg    Exam:  General:  Appears calm and comfortable and is in NAD Eyes:  EOMI, normal lids, iris ENT:  grossly normal hearing, lips & tongue, mmm Neck:  no LAD, masses or thyromegaly Cardiovascular:  RRR, no m/r/g. No LE edema.  Respiratory:   CTA bilaterally with no wheezes/rales/rhonchi.  Normal respiratory effort. Abdomen:  soft, NT, ND Skin:  s/p B toe/ray amputations with sanguinous drainage on B bandages at this time Musculoskeletal:  grossly normal tone BUE/BLE, good ROM, s/p B toe amputations Psychiatric:  blunted mood and affect, speech fluent and appropriate,  AOx3 Neurologic:  CN 2-12 grossly intact, moves all extremities in coordinated fashion  Data Reviewed: I have reviewed the patient's lab results since admission.  Pertinent labs for today include:  CO2 21, stable Glucose 150 WBC 8 Hgb 8.5, stable Platelets 443     Family Communication: None present  Disposition: Status is: Inpatient Remains inpatient appropriate because: awaiting placement     Time spent: 35 minutes  Unresulted Labs (From admission, onward)     Start     Ordered   09/20/23 0500  CBC with Differential/Platelet  Tomorrow morning,   R       Question:  Specimen collection method  Answer:  Lab=Lab collect   09/19/23 0829   09/20/23 0500  Basic metabolic panel  Tomorrow morning,   R       Question:  Specimen collection method  Answer:  Lab=Lab collect   09/19/23 2440             Author: Jonah Blue, MD 09/20/2023 7:43 AM  For on call review [email or web address].

## 2023-09-21 DIAGNOSIS — K611 Rectal abscess: Secondary | ICD-10-CM | POA: Diagnosis not present

## 2023-09-21 LAB — CBC WITH DIFFERENTIAL/PLATELET
Abs Immature Granulocytes: 0.01 10*3/uL (ref 0.00–0.07)
Basophils Absolute: 0 10*3/uL (ref 0.0–0.1)
Basophils Relative: 0 %
Eosinophils Absolute: 0.1 10*3/uL (ref 0.0–0.5)
Eosinophils Relative: 1 %
HCT: 25.8 % — ABNORMAL LOW (ref 39.0–52.0)
Hemoglobin: 8.3 g/dL — ABNORMAL LOW (ref 13.0–17.0)
Immature Granulocytes: 0 %
Lymphocytes Relative: 26 %
Lymphs Abs: 1.8 10*3/uL (ref 0.7–4.0)
MCH: 27.9 pg (ref 26.0–34.0)
MCHC: 32.2 g/dL (ref 30.0–36.0)
MCV: 86.6 fL (ref 80.0–100.0)
Monocytes Absolute: 0.5 10*3/uL (ref 0.1–1.0)
Monocytes Relative: 8 %
Neutro Abs: 4.5 10*3/uL (ref 1.7–7.7)
Neutrophils Relative %: 65 %
Platelets: 455 10*3/uL — ABNORMAL HIGH (ref 150–400)
RBC: 2.98 MIL/uL — ABNORMAL LOW (ref 4.22–5.81)
RDW: 15.6 % — ABNORMAL HIGH (ref 11.5–15.5)
WBC: 7 10*3/uL (ref 4.0–10.5)
nRBC: 0 % (ref 0.0–0.2)

## 2023-09-21 LAB — URINE CULTURE: Culture: NO GROWTH

## 2023-09-21 LAB — BASIC METABOLIC PANEL
Anion gap: 12 (ref 5–15)
BUN: 15 mg/dL (ref 8–23)
CO2: 22 mmol/L (ref 22–32)
Calcium: 8.3 mg/dL — ABNORMAL LOW (ref 8.9–10.3)
Chloride: 103 mmol/L (ref 98–111)
Creatinine, Ser: 0.94 mg/dL (ref 0.61–1.24)
GFR, Estimated: >60 mL/min (ref >60)
Glucose, Bld: 171 mg/dL — ABNORMAL HIGH (ref 70–99)
Potassium: 4 mmol/L (ref 3.5–5.1)
Sodium: 137 mmol/L (ref 135–145)

## 2023-09-21 LAB — GLUCOSE, CAPILLARY
Glucose-Capillary: 151 mg/dL — ABNORMAL HIGH (ref 70–99)
Glucose-Capillary: 138 mg/dL — ABNORMAL HIGH (ref 70–99)
Glucose-Capillary: 218 mg/dL — ABNORMAL HIGH (ref 70–99)
Glucose-Capillary: 130 mg/dL — ABNORMAL HIGH (ref 70–99)

## 2023-09-21 MED ORDER — BISACODYL 5 MG PO TBEC: 5.0000 mg | DELAYED_RELEASE_TABLET | Freq: Every day | ORAL | Status: DC | PRN

## 2023-09-21 MED ORDER — SODIUM CHLORIDE 0.9% FLUSH: 3.0000 mL | INTRAVENOUS | Status: DC | PRN

## 2023-09-21 MED ORDER — TAMSULOSIN HCL 0.4 MG PO CAPS
0.8000 mg | ORAL_CAPSULE | Freq: Every day | ORAL | Status: DC
  Administered 2023-09-22 – 2023-09-23 (×2): 0.8 mg via ORAL
  Filled 2023-09-21 (×2): qty 2

## 2023-09-21 MED ORDER — SODIUM CHLORIDE 0.9% FLUSH
3.0000 mL | Freq: Two times a day (BID) | INTRAVENOUS | Status: DC
  Administered 2023-09-21: 3 mL via INTRAVENOUS
  Administered 2023-09-21: 10 mL via INTRAVENOUS
  Administered 2023-09-22 (×2): 3 mL via INTRAVENOUS
  Administered 2023-09-23: 10 mL via INTRAVENOUS

## 2023-09-21 NOTE — Plan of Care (Signed)
 Problem: Education: Goal: Knowledge of General Education information will improve Description: Including pain rating scale, medication(s)/side effects and non-pharmacologic comfort measures Outcome: Progressing   Problem: Health Behavior/Discharge Planning: Goal: Ability to manage health-related needs will improve Outcome: Progressing   Problem: Clinical Measurements: Goal: Ability to maintain clinical measurements within normal limits will improve Outcome: Progressing Goal: Will remain free from infection Outcome: Progressing Goal: Diagnostic test results will improve Outcome: Progressing Goal: Respiratory complications will improve Outcome: Progressing Goal: Cardiovascular complication will be avoided Outcome: Progressing   Problem: Activity: Goal: Risk for activity intolerance will decrease Outcome: Progressing   Problem: Nutrition: Goal: Adequate nutrition will be maintained Outcome: Progressing   Problem: Coping: Goal: Level of anxiety will decrease Outcome: Progressing   Problem: Elimination: Goal: Will not experience complications related to bowel motility Outcome: Progressing Goal: Will not experience complications related to urinary retention Outcome: Progressing   Problem: Pain Managment: Goal: General experience of comfort will improve and/or be controlled Outcome: Progressing   Problem: Safety: Goal: Ability to remain free from injury will improve Outcome: Progressing   Problem: Skin Integrity: Goal: Risk for impaired skin integrity will decrease Outcome: Progressing   Problem: Education: Goal: Ability to describe self-care measures that may prevent or decrease complications (Diabetes Survival Skills Education) will improve Outcome: Progressing Goal: Individualized Educational Video(s) Outcome: Progressing   Problem: Coping: Goal: Ability to adjust to condition or change in health will improve Outcome: Progressing   Problem: Fluid  Volume: Goal: Ability to maintain a balanced intake and output will improve Outcome: Progressing   Problem: Health Behavior/Discharge Planning: Goal: Ability to identify and utilize available resources and services will improve Outcome: Progressing Goal: Ability to manage health-related needs will improve Outcome: Progressing   Problem: Metabolic: Goal: Ability to maintain appropriate glucose levels will improve Outcome: Progressing   Problem: Nutritional: Goal: Maintenance of adequate nutrition will improve Outcome: Progressing Goal: Progress toward achieving an optimal weight will improve Outcome: Progressing   Problem: Skin Integrity: Goal: Risk for impaired skin integrity will decrease Outcome: Progressing   Problem: Tissue Perfusion: Goal: Adequacy of tissue perfusion will improve Outcome: Progressing   Problem: Education: Goal: Knowledge of General Education information will improve Description: Including pain rating scale, medication(s)/side effects and non-pharmacologic comfort measures Outcome: Progressing   Problem: Health Behavior/Discharge Planning: Goal: Ability to manage health-related needs will improve Outcome: Progressing   Problem: Clinical Measurements: Goal: Ability to maintain clinical measurements within normal limits will improve Outcome: Progressing Goal: Will remain free from infection Outcome: Progressing Goal: Diagnostic test results will improve Outcome: Progressing Goal: Respiratory complications will improve Outcome: Progressing Goal: Cardiovascular complication will be avoided Outcome: Progressing   Problem: Activity: Goal: Risk for activity intolerance will decrease Outcome: Progressing   Problem: Nutrition: Goal: Adequate nutrition will be maintained Outcome: Progressing   Problem: Coping: Goal: Level of anxiety will decrease Outcome: Progressing   Problem: Elimination: Goal: Will not experience complications related to  bowel motility Outcome: Progressing Goal: Will not experience complications related to urinary retention Outcome: Progressing   Problem: Pain Managment: Goal: General experience of comfort will improve and/or be controlled Outcome: Progressing   Problem: Safety: Goal: Ability to remain free from injury will improve Outcome: Progressing   Problem: Skin Integrity: Goal: Risk for impaired skin integrity will decrease Outcome: Progressing   Problem: Education: Goal: Knowledge of the prescribed therapeutic regimen will improve Outcome: Progressing Goal: Ability to verbalize activity precautions or restrictions will improve Outcome: Progressing Goal: Understanding of discharge needs will improve Outcome: Progressing  Problem: Activity: Goal: Ability to perform//tolerate increased activity and mobilize with assistive devices will improve Outcome: Progressing

## 2023-09-21 NOTE — Progress Notes (Signed)
 Progress Note   Patient: Henry Bryant NGE:[id number] DOB: 1960-09-01 DOA: 09/06/2023     15 DOS: the patient was seen and examined on 09/21/2023   Brief hospital course: 62yo with h/o DM, chronic HRrEF, HTN, pulmonary HTN, afib, recent B toe amputation, and urinary retention with self-catheterization who presented on 3/1 with rectal pain and purulent discharge.  He was found to have a rectal abscess with Klebsiella pneumoniae bacteremia (also UTI) s/p I&D (OSH); surgery has consulted and he has completed antibiotic therapy.  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  Wound vacs are in place.  He is medically stable and awaiting SNF placement.  Assessment and Plan:  Patient is medically stable for discharge.  He has [place] Medicaid and has been declined for all facilities in [facility].  He will likely need a facility in [place] and is in agreement, awaiting acceptance and insurance approval.     Sepsis due to rectal abscess Emphysematous cystitis, Klebsiella UTI Klebsiella pneumoniae bacteremia -Imaging at [facility] had shown large loculated collection in the left buttocks and perirectal tissues, underwent I&D there, catheter was placed into the abscess cavity to help facilitate drainage. -CT abdomen 09/08/2023 revealed significant improvement in the left perirectal and left buttock region compared to prior exam.  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 -Seen by surgery, Dr. Dwain Sarna, recommended discharge on 10 days of antibiotics and follow-up in [facility] with [facility] surgeon -Patient was on IV Unasyn -> Augmentin and doxycycline.  Patient has completed antibiotics.   -Blood cultures at [facility] positive for Klebsiella pneumonia, urine culture here on 3/1 positive for Klebsiella -Seen by urology on 09/08/2023, Dr. Lafonda Mosses.  Urology team recommended continuing Foley catheter -Ongoing rectal pain noted -Surgery reconsulted,  unremarkable evaluation, likely worsened by constipation -needs better bowel regimen -Surgery has signed off at this time   Diabetic ulcer of toes, osteomyelitis bilateral first metatarsals -Prior toe amputation at [facility] -R in 08/2023.   -MRI of the toes showed prior amputation of the great toe with osteomyelitis of the distal half of the first metatarsal on the left and right, no abscess.  -Vascular surgery requested orthopedic opinion.   -Seen by Dr. Lajoyce Corners, recommended first ray and second toe amputation bilaterally, and dressing changes to both knees -Patient underwent surgery on 09/12/2023 -B wound vacs removed on 3/13 due to R foot with increasing drainage and new pain overnight -Orthopedics consulted for reassessment -Some wound dehiscence noted on the R and will now also on the L -Dr. Christell Constant is covering for Dr. Lajoyce Corners this weekend and assessed feet last night, stable and unlikely to need further intervention   Peripheral arterial disease with history of revascularization -Followed by vascular surgery outpatient, last saw Dr. Hetty Blend, underwent lower extremity angiogram with revascularization in 06/2023  -Given poor healing and findings of osteomyelitis, vascular surgery recommended orthopedics consult on admission and he underwent amputation   Insulin-dependent diabetes mellitus, uncontrolled with with hyperglycemia -hemoglobin A1c 6.8 on 09/06/2023, good control -Continue Lantus 30 units at bedtime -Hold Jardiance -SSI moderate  -Liberalized diet at request of pharmacy   Chronic systolic CHF -euvolemic -Continue Toprol-XL, low-dose Entresto, Crestor  -Hold spironolactone   Atrial flutter -Rate controlled with amiodarone, metoprolol -Resume Eliquis   Anxiety -Continue alprazolam   Urinary retention -Has been doing self-cath q5h at home -Currently has a foley, which urology recommends leaving in -Continue tamsulosin -Reports significant burning, UA unremarkable and so will  increase tamsulosin dose       Consultants: Surgery Urology Vascular surgery ID   Procedures: Amputations of B 1st rays and 2nd ray of R foot, 3/7   Antibiotics: Unasyn -> Augmentin x 7 days Cefazolin x 1 dose Doxycycline x 7 days Zosyn x 3 days Vancomycin x 3 days   30 Day Unplanned Readmission Risk Score    Flowsheet Row Admission (Current) from 09/06/2023 in [facility]  SURGICAL  30 Day Unplanned Readmission Risk Score (%) 16.84 Filed at 09/21/2023 0801       This score is the patient's risk of an unplanned readmission within 30 days of being discharged (0 -100%). The score is based on dignosis, age, lab data, medications, orders, and past utilization.   Low:  0-14.9   Medium: 15-21.9   High: 22-29.9   Extreme: 30 and above           Subjective: Had a small stool yesterday, still feels constipated.  Happier with his bed.  Situational depression, denies need to medication or psychiatric support.   Objective: Vitals:   09/21/23 1233 09/21/23 1234  BP: (!) 87/47 (!) 91/49  Pulse: 66 66  Resp: 17 17  Temp: 97.6 F (36.4 C)   SpO2: 100% 100%    Intake/Output Summary (Last 24 hours) at 09/21/2023 1342 Last data filed at 09/21/2023 0900 Gross per 24 hour  Intake 480 ml  Output 1000 ml  Net -520 ml   Filed Weights   09/06/23 1751 09/12/23 0859  Weight: 63.8 kg 63.5 kg    Exam:  General:  Appears calm and comfortable and is in NAD Eyes:  EOMI, normal lids, iris ENT:  grossly normal hearing, lips & tongue, mmm Neck:  no LAD, masses or thyromegaly Cardiovascular:  RRR, no m/r/g. No LE edema.  Respiratory:   CTA bilaterally with no wheezes/rales/rhonchi.  Normal respiratory effort. Abdomen:  soft, NT, ND Skin:  s/p B toe/ray amputations with clean dressings/wraps at this time Musculoskeletal:  grossly normal tone BUE/BLE, good ROM, s/p B toe amputations Psychiatric:  blunted mood and affect, speech fluent and appropriate,  AOx3 Neurologic:  CN 2-12 grossly intact, moves all extremities in coordinated fashion   Data Reviewed: I have reviewed the patient's lab results since admission.  Pertinent labs for today include:   Glucose 171 Stable CBC UA unremarkable (trace LE, 30 protein, rare bacteria), culture pending     Family Communication: None present; declined having me call his wife today  Disposition: Status is: Inpatient Remains inpatient appropriate because: needs placement     Time spent: 50 minutes  Unresulted Labs (From admission, onward)     Start     Ordered   09/20/23 1603  Remove and replace urinary cath (placed > 5 days) then obtain urine culture from new indwelling urinary catheter.  (Urine Culture)  Once,   R       Question:  Indication  Answer:  Dysuria   09/20/23 1602             Author: Jonah Blue, MD 09/21/2023 1:42 PM  For on call review [email or web address].

## 2023-09-21 NOTE — Progress Notes (Signed)
 Physical Therapy Treatment Patient Details Name: Henry Bryant MRN: [id number] DOB: 1960-10-08 Today's Date: 09/21/2023   History of Present Illness 63 y.o. male presents to [facility] 09/06/23 from [facility] with rectal pain and purulent discharge. Hx of prior fall several days prior. Pt w/ sepsis 2/2 rectal abscess and emphysematous cystitis, s/p I&D at [facility]. Pt also has osteomyelitis of B first metatarsals with ulcers of first metatarsal head B. 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 PMHx: T2DM, chronic systolic CHF, hypertension, pulmonary hypertension, A-fib/flutter, recent bilateral toe amputation by podiatry about 4 weeks ago    PT Comments  The pt was agreeable to session with focus on EOB/OOB mobility. He reports willingness to attempt OOB mobility despite continued pain. Pt able to complete rolling in bed with supervision and transition to sitting EOB x3 in session with minA. Pt limited in time spent sitting EOB due to dizziness, BP taken when pt in sitting was elevated rather than soft (see below). He was unable to progress to standing or transfers due to this dizziness, and reports improvement when returned to supine. Will continue to benefit from skilled PT to progress activity tolerance, strength, and balance. Pt encouraged to continue with LE exercises and sit in chair position in bed more frequently. Of note, pt soiled of BM upon arrival and states he was unaware. May benefit from more frequent checks to prevent further wounds or skin breakdown given presence of open wounds on his buttocks, NT aware.   VITALS:  - sitting EOB - BP: 143/122 (130); HR: 104bpm - supine - BP: 117/59 (75); HR: 65bpm - sitting in bed with HOB at 45 deg - BP: 87/47 (60); HR: 68bpm - sitting in bed with HOB at 40 deg - 91/49 (62); HR: 66bpm   If plan is discharge home, recommend the following: A little help with bathing/dressing/bathroom;Assistance with  cooking/housework;Assist for transportation;Help with stairs or ramp for entrance;Two people to help with walking and/or transfers   Can travel by private vehicle     No  Equipment Recommendations  None recommended by PT (consider air bed and mechanical lift pending progress (if home directly from hospital))    Recommendations for Other Services       Precautions / Restrictions Precautions Precautions: Fall Recall of Precautions/Restrictions: Intact Precaution/Restrictions Comments: Contact; foley catheter Required Braces or Orthoses: Other Brace Other Brace: B post op shoes (pt refused to accept post-op shoes from vendor, not in room; asked for MD to order Prevalon boots for him) Restrictions Weight Bearing Restrictions Per Provider Order: Yes RLE Weight Bearing Per Provider Order: Weight bearing as tolerated LLE Weight Bearing Per Provider Order: Weight bearing as tolerated Other Position/Activity Restrictions: WB on heels only for transfers, no gait training     Mobility  Bed Mobility Overal bed mobility: Needs Assistance Bed Mobility: Rolling, Sidelying to Sit, Sit to Sidelying Rolling: Supervision Sidelying to sit: Min assist, Used rails     Sit to sidelying: Min assist, Used rails General bed mobility comments: rolling to L and R with supervision, minA to elevate to sitting EOB x 3 in session. pt reports dizziness in sitting    Transfers Overall transfer level: Needs assistance                 General transfer comment: limited by onset of dizziness in sitting, pt reports feeling like he is going to pass out. initial BP taken wiht dinamap was elevated  rather than soft    Ambulation/Gait               General Gait Details: pt heel WB only for transfers, gait N/A      Balance Overall balance assessment: Needs assistance Sitting-balance support: Feet supported, Bilateral upper extremity supported Sitting balance-Leahy Scale: Poor Sitting balance -  Comments: holding EOB with BUE support, no lean or LOB                                    Communication Communication Communication: No apparent difficulties  Cognition Arousal: Alert Behavior During Therapy: WFL for tasks assessed/performed   PT - Cognitive impairments: No family/caregiver present to determine baseline, Safety/Judgement                       PT - Cognition Comments: Pt appears to have poor insight into need to participate more regularly in therapy sessions; he reports his daughter can help both he and his spouse with what they need at home. Daughter not present to confirm this. PTA reinforces needing to mobilize more in acute setting to build strength prior to DC home or post-acute rehab setting. Pt perseverating on insurance challenges. Following commands: Intact      Cueing Cueing Techniques: Verbal cues  Exercises      General Comments        Pertinent Vitals/Pain Pain Assessment Pain Assessment: 0-10 Pain Score: 7  Faces Pain Scale: Hurts even more Pain Location: rectum, B feet Pain Descriptors / Indicators: Discomfort, Aching, Guarding Pain Intervention(s): Limited activity within patient's tolerance, Monitored during session, Repositioned     PT Goals (current goals can now be found in the care plan section) Acute Rehab PT Goals Patient Stated Goal: to go home PT Goal Formulation: With patient Time For Goal Achievement: 09/27/23 Potential to Achieve Goals: Good Progress towards PT goals: Progressing toward goals    Frequency    Min 3X/week       AM-PAC PT "6 Clicks" Mobility   Outcome Measure  Help needed turning from your back to your side while in a flat bed without using bedrails?: None Help needed moving from lying on your back to sitting on the side of a flat bed without using bedrails?: A Little Help needed moving to and from a bed to a chair (including a wheelchair)?: Total Help needed standing up from a  chair using your arms (e.g., wheelchair or bedside chair)?: Total Help needed to walk in hospital room?: Total Help needed climbing 3-5 steps with a railing? : Total 6 Click Score: 11    End of Session Equipment Utilized During Treatment: Gait belt Activity Tolerance: Other (comment) (dizziness) Patient left: in bed;with call bell/phone within reach;with bed alarm set (chair position in bed with HOB at 45 deg) Nurse Communication: Mobility status PT Visit Diagnosis: Other abnormalities of gait and mobility (R26.89);Muscle weakness (generalized) (M62.81)     Time: 1610-9604 PT Time Calculation (min) (ACUTE ONLY): 30 min  Charges:    $Therapeutic Exercise: 8-22 mins $Therapeutic Activity: 8-22 mins PT General Charges $$ ACUTE PT VISIT: 1 Visit                     Vickki Muff, PT, DPT   Acute Rehabilitation Department Office [phone] Secure Chat Communication Preferred   Henry Bryant 09/21/2023, 3:36 PM

## 2023-09-21 NOTE — Progress Notes (Signed)
 PT Cancellation Note  Patient Details Name: Henry Bryant MRN: [id number] DOB: [birth year]   Cancelled Treatment:    Reason Eval/Treat Not Completed: Other (comment) pt declined due to constipation this morning, asked PT to return around lunchtime. Will return later in day to attempt OOB transfers.   Vickki Muff, PT, DPT   Acute Rehabilitation Department Office [phone] Secure Chat Communication Preferred   Ronnie Derby 09/21/2023, 9:49 AM

## 2023-09-22 DIAGNOSIS — K611 Rectal abscess: Secondary | ICD-10-CM | POA: Diagnosis not present

## 2023-09-22 LAB — GLUCOSE, CAPILLARY
Glucose-Capillary: 136 mg/dL — ABNORMAL HIGH (ref 70–99)
Glucose-Capillary: 98 mg/dL (ref 70–99)
Glucose-Capillary: 205 mg/dL — ABNORMAL HIGH (ref 70–99)
Glucose-Capillary: 161 mg/dL — ABNORMAL HIGH (ref 70–99)

## 2023-09-22 MED ORDER — OXYCODONE HCL 5 MG PO TABS
5.0000 mg | ORAL_TABLET | Freq: Four times a day (QID) | ORAL | Status: DC | PRN
  Administered 2023-09-22 – 2023-09-23 (×3): 5 mg via ORAL
  Filled 2023-09-22 (×3): qty 1

## 2023-09-22 MED ORDER — MAGNESIUM HYDROXIDE 400 MG/5ML PO SUSP: 30.0000 mL | Freq: Every day | ORAL | Status: DC | PRN

## 2023-09-22 NOTE — Progress Notes (Signed)
 Physical Therapy Treatment Patient Details Name: Henry Bryant MRN: [id number] DOB: 09-03-1960 Today's Date: 09/22/2023   History of Present Illness 63 y.o. male presents to [facility] 09/06/23 from [facility] with rectal pain and purulent discharge. Hx of prior fall several days prior. Pt w/ sepsis 2/2 rectal abscess and emphysematous cystitis, s/p I&D at [facility]. Pt also has osteomyelitis of B first metatarsals with ulcers of first metatarsal head B. 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 PMHx: T2DM, chronic systolic CHF, hypertension, pulmonary hypertension, A-fib/flutter, recent bilateral toe amputation by podiatry about 4 weeks ago    PT Comments  Pt received in supine, agreeable to therapy session with encouragement, pt c/o fatigue and bottom pain, limited today due to soft BP and c/o lightheadedness vs dizziness while sitting EOB. Pt did tolerate EOB >5 mins with up to CGA for support but defers lateral scoot transfer training due to lightheadedness/fatigue after sitting EOB for BP assessment and taking meds while RN in room. Reinforced importance of pressure relief, purpose of post-op shoe if OOB to drop arm chair (ortho tech called and plan to deliver shoes again), prevalon boots as tolerated in supine during the day (can try on/off schedule every couple hours if pt agreeable but he defers during session), and benefits of more frequent mobility to maintain strength. Patient will benefit from continued inpatient follow up therapy, <3 hours/day, currently he is not able to perform transfers on his own or even attempt OOB with +2 assist due to symptoms, not yet safe to go home with assist only from spouse (spouse also relies on caregiver).   If plan is discharge home, recommend the following: A little help with bathing/dressing/bathroom;Assistance with cooking/housework;Assist for transportation;Help with stairs or ramp for entrance;Two people to help with  walking and/or transfers   Can travel by private vehicle     No  Equipment Recommendations  None recommended by PT (consider air bed and mechanical lift pending progress (if home directly from hospital))    Recommendations for Other Services       Precautions / Restrictions Precautions Precautions: Fall Recall of Precautions/Restrictions: Intact Precaution/Restrictions Comments: Contact; foley catheter Required Braces or Orthoses: Other Brace Other Brace: B post op shoes (not in room, PTA explained purpose of post-op shoes and pt agreeable to accept them when they arrive) Restrictions Weight Bearing Restrictions Per Provider Order: Yes RLE Weight Bearing Per Provider Order: Weight bearing as tolerated LLE Weight Bearing Per Provider Order: Weight bearing as tolerated Other Position/Activity Restrictions: WB on heels only for transfers, no gait training     Mobility  Bed Mobility Overal bed mobility: Needs Assistance Bed Mobility: Rolling, Sidelying to Sit, Sit to Sidelying Rolling: Supervision, Used rails Sidelying to sit: Min assist, Used rails, HOB elevated     Sit to sidelying: Min assist, Used rails General bed mobility comments: rolling to L and R with supervision w/rails, minA to elevate to sitting to R EOB. pt reports dizziness in sitting but tolerates >5 mins while RN bringing medicine to him and BP checked x3 while upright.    Transfers Overall transfer level: Needs assistance                 General transfer comment: limited by onset of dizziness in sitting, pt reports feeling like he is going to pass out (BP soft but stable over ~5 mins while sitting EOB, checked x3). pt encouraged to try lateral scooting before returning  to supine but he defers.    Ambulation/Gait               General Gait Details: pt heel WB only for transfers, gait N/A and no post-op shoes in room   Stairs             Wheelchair Mobility     Tilt Bed     Modified Rankin (Stroke Patients Only)       Balance Overall balance assessment: Needs assistance Sitting-balance support: Feet supported, Bilateral upper extremity supported Sitting balance-Leahy Scale: Poor Sitting balance - Comments: holding EOB with BUE support and Supervision to CGA; CGA when only supported by single UE due to pt c/o dizziness       Standing balance comment: defer                            Communication Communication Communication: No apparent difficulties  Cognition Arousal: Alert Behavior During Therapy: WFL for tasks assessed/performed   PT - Cognitive impairments: No family/caregiver present to determine baseline, Safety/Judgement                       PT - Cognition Comments: Pt c/o fatigue but agreeable to participate with encouragement. Following commands: Intact      Cueing Cueing Techniques: Verbal cues  Exercises Other Exercises Other Exercises: seated BLE AROM: LAQ (unable to achieve TKE on either leg, slighlty improved extension on LLE compared with RLE) Other Exercises: RLE hamstring stretch x30 sec sitting EOB with PTA holding RLE extended out    General Comments General comments (skin integrity, edema, etc.): Discussion on reasoning for use of Prevalon boots, pressure relief frequency/technique, need to float heels when supine, orthostatic BP assessment purpose, need to increase fluid intake (pt reports water gives him heartburn- always has for his whole life according to him).      Pertinent Vitals/Pain Pain Assessment Pain Assessment: 0-10 Pain Score: 10-Worst pain ever Faces Pain Scale: Hurts even more Pain Location: rectum> bil feet Pain Descriptors / Indicators: Discomfort, Aching, Guarding, Grimacing, Numbness, Sharp Pain Intervention(s): Limited activity within patient's tolerance, Monitored during session, Repositioned, Patient requesting pain meds-RN notified, RN gave pain meds during session     Home Living                          Prior Function            PT Goals (current goals can now be found in the care plan section) Acute Rehab PT Goals Patient Stated Goal: to go home PT Goal Formulation: With patient Time For Goal Achievement: 09/27/23 Progress towards PT goals: Progressing toward goals    Frequency    Min 3X/week      PT Plan      Co-evaluation PT/OT/SLP Co-Evaluation/Treatment: Yes Reason for Co-Treatment: Complexity of the patient's impairments (multi-system involvement);For patient/therapist safety;To address functional/ADL transfers PT goals addressed during session: Balance;Strengthening/ROM;Mobility/safety with mobility        AM-PAC PT "6 Clicks" Mobility   Outcome Measure  Help needed turning from your back to your side while in a flat bed without using bedrails?: A Little (w/o rails) Help needed moving from lying on your back to sitting on the side of a flat bed without using bedrails?: A Little Help needed moving to and from a bed to a chair (including a wheelchair)?: Total Help needed standing up from  a chair using your arms (e.g., wheelchair or bedside chair)?: Total Help needed to walk in hospital room?: Total Help needed climbing 3-5 steps with a railing? : Total 6 Click Score: 10    End of Session   Activity Tolerance: Other (comment);Patient limited by fatigue (dizziness and soft BP sitting EOB and in supine) Patient left: in bed;with call bell/phone within reach;with bed alarm set (chair position in bed, heels floated) Nurse Communication: Mobility status;Need for lift equipment;Other (comment) (lateral scoot once bil post-op shoes arrive, currently needs mechanical lift if he gets up to chair) PT Visit Diagnosis: Other abnormalities of gait and mobility (R26.89);Muscle weakness (generalized) (M62.81)     Time: 1610-9604 PT Time Calculation (min) (ACUTE ONLY): 26 min  Charges:    $Therapeutic Exercise: 8-22  mins PT General Charges $$ ACUTE PT VISIT: 1 Visit                     Shelbe Haglund P., PTA Acute Rehabilitation Services Secure Chat Preferred 9a-5:30pm Office: [phone]    Dorathy Kinsman [facility] 09/22/2023, 1:22 PM

## 2023-09-22 NOTE — Progress Notes (Signed)
 Occupational Therapy Treatment Patient Details Name: Henry Bryant MRN: [id number] DOB: 1961/02/24 Today's Date: 09/22/2023   History of present illness 63 y.o. male presents to [facility] 09/06/23 from [facility] with rectal pain and purulent discharge. Hx of prior fall several days prior. Pt w/ sepsis 2/2 rectal abscess and emphysematous cystitis, s/p I&D at [facility]. Pt also has osteomyelitis of B first metatarsals with ulcers of first metatarsal head B. 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 PMHx: T2DM, chronic systolic CHF, hypertension, pulmonary hypertension, A-fib/flutter, recent bilateral toe amputation by podiatry about 4 weeks ago   OT comments  Pt in bed and agreeable to OT/PT session with encouragement.  Patient continues to reports pain in buttocks, but RN providing medication during session.  He requires min assist for bed mobility and min guard for EOB tasks.  Dizziness reported throughout session, BP soft with initial drop when sitting EOB but recovered with increased time (although pt continues to report dizziness).  Pt declines transfers OOB, but does report his goal is to stand with therapy by the end of the week.  Encouraged increased intake and increased mobility for strength and activity tolerance.  Continue to recommend <3hrs/day inpatient setting at dc.  Will follow acutely.        If plan is discharge home, recommend the following:  Two people to help with walking and/or transfers;A lot of help with bathing/dressing/bathroom;Direct supervision/assist for medications management;Direct supervision/assist for financial management;Assist for transportation;Help with stairs or ramp for entrance;Supervision due to cognitive status   Equipment Recommendations  Other (comment) (TBD)    Recommendations for Other Services      Precautions / Restrictions Precautions Precautions: Fall Recall of Precautions/Restrictions:  Intact Precaution/Restrictions Comments: Contact; foley catheter Required Braces or Orthoses: Other Brace Other Brace: B post op shoes (not in room, PTA explained purpose of post-op shoes and pt agreeable to accept them when they arrive) Restrictions Weight Bearing Restrictions Per Provider Order: Yes RLE Weight Bearing Per Provider Order: Weight bearing as tolerated LLE Weight Bearing Per Provider Order: Weight bearing as tolerated Other Position/Activity Restrictions: WB on heels only for transfers, no gait training       Mobility Bed Mobility Overal bed mobility: Needs Assistance Bed Mobility: Rolling, Sidelying to Sit, Sit to Sidelying Rolling: Supervision, Used rails Sidelying to sit: Min assist, Used rails, HOB elevated     Sit to sidelying: Min assist, Used rails General bed mobility comments: rolling to L and R with supervision w/rails, minA to elevate to sitting to R EOB. pt reports dizziness in sitting but tolerates >5 mins while RN bringing medicine to him and BP checked x3 while upright.    Transfers Overall transfer level: Needs assistance                 General transfer comment: limited by onset of dizziness in sitting, pt reports feeling like he is going to pass out (BP soft but stable over ~5 mins while sitting EOB, checked x3). pt encouraged to try lateral scooting before returning to supine but he defers.     Balance Overall balance assessment: Needs assistance Sitting-balance support: Feet supported, Bilateral upper extremity supported Sitting balance-Leahy Scale: Poor Sitting balance - Comments: holding EOB with BUE support and Supervision to CGA; CGA when only supported by single UE due to pt c/o dizziness       Standing balance comment: defer  ADL either performed or assessed with clinical judgement   ADL Overall ADL's : Needs assistance/impaired     Grooming: Sitting;Contact guard assist Grooming Details  (indicate cue type and reason): preference to 1 UE support, min guard for safety due to dizziness         Upper Body Dressing : Contact guard assist;Sitting   Lower Body Dressing: Total assistance;Bed level     Toilet Transfer Details (indicate cue type and reason): deferred due to dizziness         Functional mobility during ADLs: Minimal assistance      Extremity/Trunk Assessment              Vision       Perception     Praxis     Communication Communication Communication: No apparent difficulties   Cognition Arousal: Alert Behavior During Therapy: WFL for tasks assessed/performed Cognition: No family/caregiver present to determine baseline             OT - Cognition Comments: not formally assessed, but patient does not recall heel WB precatuions only for transfers.  Poor reasoning and problem sovling, but could be to poor health literacy rather than cognition.                 Following commands: Intact        Cueing   Cueing Techniques: Verbal cues  Exercises      Shoulder Instructions       General Comments Discussion on reasoning for use of Prevalon boots, pressure relief frequency/technique, need to float heels when supine, orthostatic BP assessment purpose, need to increase fluid intake (pt reports water gives him heartburn- always has for his whole life according to him).    Pertinent Vitals/ Pain       Pain Assessment Pain Assessment: 0-10 Pain Score: 10-Worst pain ever Faces Pain Scale: Hurts even more Pain Location: rectum> bil feet Pain Descriptors / Indicators: Discomfort, Aching, Guarding, Grimacing, Numbness, Sharp Pain Intervention(s): Limited activity within patient's tolerance, Monitored during session, Repositioned, Premedicated before session  Home Living                                          Prior Functioning/Environment              Frequency  Min 2X/week        Progress Toward  Goals  OT Goals(current goals can now be found in the care plan section)  Progress towards OT goals: Progressing toward goals (slowly)  Acute Rehab OT Goals Patient Stated Goal: get better, less pain OT Goal Formulation: With patient Time For Goal Achievement: 09/28/23 Potential to Achieve Goals: Fair  Plan      Co-evaluation    PT/OT/SLP Co-Evaluation/Treatment: Yes Reason for Co-Treatment: Complexity of the patient's impairments (multi-system involvement);For patient/therapist safety;To address functional/ADL transfers PT goals addressed during session: Balance;Strengthening/ROM;Mobility/safety with mobility OT goals addressed during session: ADL's and self-care      AM-PAC OT "6 Clicks" Daily Activity     Outcome Measure   Help from another person eating meals?: A Little Help from another person taking care of personal grooming?: A Little Help from another person toileting, which includes using toliet, bedpan, or urinal?: A Lot Help from another person bathing (including washing, rinsing, drying)?: A Lot Help from another person to put on and taking off regular upper body clothing?: A Little  Help from another person to put on and taking off regular lower body clothing?: A Lot 6 Click Score: 15    End of Session    OT Visit Diagnosis: Unsteadiness on feet (R26.81);Other abnormalities of gait and mobility (R26.89);Repeated falls (R29.6);Muscle weakness (generalized) (M62.81);Other symptoms and signs involving cognitive function;Pain   Activity Tolerance Other (comment) (dizziness)   Patient Left in bed;with call bell/phone within reach;with bed alarm set   Nurse Communication Mobility status        Time: 9147-8295 OT Time Calculation (min): 25 min  Charges: OT General Charges $OT Visit: 1 Visit OT Treatments $Self Care/Home Management : 8-22 mins  Barry Brunner, OT Acute Rehabilitation Services Office [phone] Secure Chat Preferred    Chancy Milroy 09/22/2023, 1:34 PM

## 2023-09-22 NOTE — Progress Notes (Addendum)
 Progress Note   Patient: Henry Bryant YQM:[id number] DOB: 07-22-1960 DOA: 09/06/2023     16 DOS: the patient was seen and examined on 09/22/2023   Brief hospital course: 63yo with h/o DM, chronic HRrEF, HTN, pulmonary HTN, afib, recent B toe amputation, and urinary retention with self-catheterization who presented on 3/1 with rectal pain and purulent discharge.  He was found to have a rectal abscess with Klebsiella pneumoniae bacteremia (also UTI) s/p I&D (OSH); surgery has consulted and he has completed antibiotic therapy.  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  Wound vacs are in place.  He is medically stable and awaiting SNF placement.  Assessment and Plan:  Patient is medically stable for discharge.  He has [place] Medicaid and has been declined for all facilities in [facility]  He will likely need a facility in [place] and is in agreement, awaiting acceptance and insurance approval.  He now has 2 facilities that have accepted him.  Goal will be to dc to one of those facilities on 3/18.     Sepsis due to rectal abscess Emphysematous cystitis, Klebsiella UTI Klebsiella pneumoniae bacteremia -Imaging at [facility] had shown large loculated collection in the left buttocks and perirectal tissues, underwent I&D there, catheter was placed into the abscess cavity to help facilitate drainage. -CT abdomen 09/08/2023 revealed significant improvement in the left perirectal and left buttock region compared to prior exam.  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 -Seen by surgery, Dr. Dwain Sarna, recommended discharge on 10 days of antibiotics and follow-up in [facility] with [facility] -Patient was on IV Unasyn -> Augmentin and doxycycline.  Patient has completed antibiotics.   -Blood cultures at [facility] positive for Klebsiella pneumonia, urine culture here on 3/1 positive for Klebsiella -Seen by urology on 09/08/2023, Dr. Lafonda Mosses.   Urology team recommended continuing Foley catheter -Ongoing rectal pain noted -Surgery reconsulted, unremarkable evaluation, likely worsened by constipation -needs better bowel regimen, wean opiates to improve constipation (stop IV, taper PO) -Surgery has signed off at this time   Diabetic ulcer of toes, osteomyelitis bilateral first metatarsals -Prior toe amputation at [facility] in 08/2023.   -MRI of the toes showed prior amputation of the great toe with osteomyelitis of the distal half of the first metatarsal on the left and right, no abscess.  -Vascular surgery requested orthopedic opinion.   -Seen by Dr. Lajoyce Corners, recommended first ray and second toe amputation bilaterally, and dressing changes to both knees -Patient underwent surgery on 09/12/2023 -B wound vacs removed on 3/13 due to R foot with increasing drainage and new pain overnight -Orthopedics consulted for reassessment -Some wound dehiscence noted on the R and will now also on the L -Dr. Christell Constant covered for Dr. Lajoyce Corners last weekend and assessed feet, stable and unlikely to need further intervention   Peripheral arterial disease with history of revascularization -Followed by vascular surgery outpatient, last saw Dr. Hetty Blend, underwent lower extremity angiogram with revascularization in 06/2023  -Given poor healing and findings of osteomyelitis, vascular surgery recommended orthopedics consult on admission and he underwent amputation   Insulin-dependent diabetes mellitus, uncontrolled with with hyperglycemia -hemoglobin A1c 6.8 on 09/06/2023, good control -Continue Lantus 30 units at bedtime -Hold Jardiance -SSI moderate  -Liberalized diet at request of pharmacy   Chronic systolic CHF -euvolemic -Continue Toprol-XL, low-dose Entresto, Crestor  -Hold spironolactone   Atrial flutter -Rate controlled with amiodarone, metoprolol -Resume Eliquis   Anxiety -Continue alprazolam  Urinary retention -Has been doing self-cath q5h at  home -Currently has a foley, which urology recommends leaving in -Continue tamsulosin, increased dose on 3/16       Consultants: Surgery Urology Vascular surgery ID   Procedures: Amputations of B 1st rays and 2nd ray of R foot, 3/7   Antibiotics: Unasyn -> Augmentin x 7 days Cefazolin x 1 dose Doxycycline x 7 days Zosyn x 3 days Vancomycin x 3 days  30 Day Unplanned Readmission Risk Score    Flowsheet Row Admission (Current) from 09/06/2023 in [facility]  SURGICAL  30 Day Unplanned Readmission Risk Score (%) 17.27 Filed at 09/22/2023 0801       This score is the patient's risk of an unplanned readmission within 30 days of being discharged (0 -100%). The score is based on dignosis, age, lab data, medications, orders, and past utilization.   Low:  0-14.9   Medium: 15-21.9   High: 22-29.9   Extreme: 30 and above           Subjective: Wants now to go to [facility] if they will accept him.  Understands that rehab will be 21 days and verified with his insurance that they can roll over to Medicare if he remains in a facility.  Still not moving his bowels well and having rectal pain.   Objective: Vitals:   09/22/23 0716 09/22/23 1239  BP: (!) 105/56 (!) 90/52  Pulse: 65 65  Resp: 16   Temp: 98 F (36.7 C)   SpO2: 99%     Intake/Output Summary (Last 24 hours) at 09/22/2023 1515 Last data filed at 09/21/2023 1700 Gross per 24 hour  Intake --  Output 350 ml  Net -350 ml   Filed Weights   09/06/23 1751 09/12/23 0859  Weight: 63.8 kg 63.5 kg    Exam:  General:  Appears calm and comfortable and is in NAD Eyes:  EOMI, normal lids, iris ENT:  grossly normal hearing, lips & tongue, mmm Neck:  no LAD, masses or thyromegaly Cardiovascular:  RRR, no m/r/g. No LE edema.  Respiratory:   CTA bilaterally with no wheezes/rales/rhonchi.  Normal respiratory effort. Abdomen:  soft, NT, ND Skin:  s/p B toe/ray amputations with clean  dressings/wraps at this time Musculoskeletal:  grossly normal tone BUE/BLE, good ROM, s/p B toe amputations Psychiatric:  blunted mood and affect, speech fluent and appropriate, AOx3 Neurologic:  CN 2-12 grossly intact, moves all extremities in coordinated fashion  Data Reviewed: I have reviewed the patient's lab results since admission.  Pertinent labs for today include:   None today     Family Communication: None present  Disposition: Status is: Inpatient Remains inpatient appropriate because: needs placement     Time spent: 35 minutes  Unresulted Labs (From admission, onward)     Start     Ordered   09/23/23 0500  CBC with Differential/Platelet  Tomorrow morning,   R       Question:  Specimen collection method  Answer:  Lab=Lab collect   09/22/23 1515   09/23/23 0500  Basic metabolic panel  Tomorrow morning,   R       Question:  Specimen collection method  Answer:  Lab=Lab collect   09/22/23 1515             Author: Jonah Blue, MD 09/22/2023 3:15 PM  For on call review [email or web address].

## 2023-09-22 NOTE — Progress Notes (Signed)
 OT Cancellation Note  Patient Details Name: Henry Bryant MRN: [id number] DOB: 02-Mar-1961   Cancelled Treatment:    Reason Eval/Treat Not Completed: Pain limiting ability to participate- pt reports being in too much pain from abscess, not sleeping all night because of it.  He has had pain medication and reports it is not helping.  OT will check back as able.   Barry Brunner, OT Acute Rehabilitation Services Office [phone] Secure Chat Preferred    Chancy Milroy 09/22/2023, 9:12 AM

## 2023-09-22 NOTE — TOC Progression Note (Addendum)
 Transition of Care [facility]) - Progression Note    Patient Details  Name: Henry Bryant MRN: [id number] Date of Birth: Jan 29, 1961  [facility] CM/SW Contact  Michaela Corner, [place] Phone Number: 09/22/2023, 1:08 PM  Clinical Narrative:   CSW spoke with patient about the two accepting bed offers at this time. Patient stated that [facility] in [place], [place] is too far. He stated that [facility] in [place] is fine but that his goal is to go home with [facility] or to work on getting [employer] (wife's insurance) to help pay for STR. Patient stated that he spoke with PT and that he will be in the hospital another week. Patient expressed to CSW that he is not in the best mood and wanted to rest. CSW notified treatment team.   4:00 PM CSW and RNCM spoke with patient about accepting SNF bed offers. Pt stated to CSW that he did not want to go to [place], [place] but will go to [facility]. CSW provided medicare.gov rating for [facility] as [facility] is unable to accept at this time. Patient does not believe he is ready to discharge but stated for CSW to go ahead and "do what needs to be done."   CSW spoke with [place] w/ [facility] [phone] [place] stated [facility] in [place] can accept pt at this time and facility will start the auth.   TOC will continue to follow.    Expected Discharge Plan: Skilled Nursing Facility Barriers to Discharge: Continued Medical Work up  Expected Discharge Plan and Services In-house Referral: Clinical Social Work     Living arrangements for the past 2 months: Single Family Home                                       Social Determinants of Health (SDOH) Interventions SDOH Screenings   Food Insecurity: No Food Insecurity (09/06/2023)  Recent Concern: Food Insecurity - Food Insecurity Present (08/11/2023)   Received from [facility]  Housing: Low Risk  (09/06/2023)  Transportation Needs: No  Transportation Needs (09/06/2023)  Utilities: Not At Risk (09/06/2023)  Financial Resource Strain: Low Risk  (08/11/2023)   Received from [facility]  Physical Activity: Inactive (08/11/2023)   Received from [facility]  Social Connections: Socially Integrated (08/11/2023)   Received from [facility]  Stress: No Stress Concern Present (08/11/2023)   Received from [facility]  Tobacco Use: Medium Risk (09/12/2023)  Health Literacy: Medium Risk (08/11/2023)   Received from [facility]    Readmission Risk Interventions     No data to display

## 2023-09-22 NOTE — Plan of Care (Signed)
 Problem: Education: Goal: Knowledge of General Education information will improve Description: Including pain rating scale, medication(s)/side effects and non-pharmacologic comfort measures Outcome: Progressing   Problem: Health Behavior/Discharge Planning: Goal: Ability to manage health-related needs will improve Outcome: Progressing   Problem: Clinical Measurements: Goal: Ability to maintain clinical measurements within normal limits will improve Outcome: Progressing Goal: Will remain free from infection Outcome: Progressing Goal: Diagnostic test results will improve Outcome: Progressing Goal: Respiratory complications will improve Outcome: Progressing Goal: Cardiovascular complication will be avoided Outcome: Progressing   Problem: Activity: Goal: Risk for activity intolerance will decrease Outcome: Progressing   Problem: Nutrition: Goal: Adequate nutrition will be maintained Outcome: Progressing   Problem: Coping: Goal: Level of anxiety will decrease Outcome: Progressing   Problem: Elimination: Goal: Will not experience complications related to bowel motility Outcome: Progressing Goal: Will not experience complications related to urinary retention Outcome: Progressing   Problem: Pain Managment: Goal: General experience of comfort will improve and/or be controlled Outcome: Progressing   Problem: Safety: Goal: Ability to remain free from injury will improve Outcome: Progressing   Problem: Skin Integrity: Goal: Risk for impaired skin integrity will decrease Outcome: Progressing   Problem: Education: Goal: Ability to describe self-care measures that may prevent or decrease complications (Diabetes Survival Skills Education) will improve Outcome: Progressing Goal: Individualized Educational Video(s) Outcome: Progressing   Problem: Coping: Goal: Ability to adjust to condition or change in health will improve Outcome: Progressing   Problem: Fluid  Volume: Goal: Ability to maintain a balanced intake and output will improve Outcome: Progressing   Problem: Health Behavior/Discharge Planning: Goal: Ability to identify and utilize available resources and services will improve Outcome: Progressing Goal: Ability to manage health-related needs will improve Outcome: Progressing   Problem: Metabolic: Goal: Ability to maintain appropriate glucose levels will improve Outcome: Progressing   Problem: Nutritional: Goal: Maintenance of adequate nutrition will improve Outcome: Progressing Goal: Progress toward achieving an optimal weight will improve Outcome: Progressing   Problem: Skin Integrity: Goal: Risk for impaired skin integrity will decrease Outcome: Progressing   Problem: Tissue Perfusion: Goal: Adequacy of tissue perfusion will improve Outcome: Progressing   Problem: Education: Goal: Knowledge of General Education information will improve Description: Including pain rating scale, medication(s)/side effects and non-pharmacologic comfort measures Outcome: Progressing   Problem: Health Behavior/Discharge Planning: Goal: Ability to manage health-related needs will improve Outcome: Progressing   Problem: Clinical Measurements: Goal: Ability to maintain clinical measurements within normal limits will improve Outcome: Progressing Goal: Will remain free from infection Outcome: Progressing Goal: Diagnostic test results will improve Outcome: Progressing Goal: Respiratory complications will improve Outcome: Progressing Goal: Cardiovascular complication will be avoided Outcome: Progressing   Problem: Activity: Goal: Risk for activity intolerance will decrease Outcome: Progressing   Problem: Nutrition: Goal: Adequate nutrition will be maintained Outcome: Progressing   Problem: Coping: Goal: Level of anxiety will decrease Outcome: Progressing   Problem: Elimination: Goal: Will not experience complications related to  bowel motility Outcome: Progressing Goal: Will not experience complications related to urinary retention Outcome: Progressing   Problem: Pain Managment: Goal: General experience of comfort will improve and/or be controlled Outcome: Progressing   Problem: Safety: Goal: Ability to remain free from injury will improve Outcome: Progressing   Problem: Skin Integrity: Goal: Risk for impaired skin integrity will decrease Outcome: Progressing   Problem: Education: Goal: Knowledge of the prescribed therapeutic regimen will improve Outcome: Progressing Goal: Ability to verbalize activity precautions or restrictions will improve Outcome: Progressing Goal: Understanding of discharge needs will improve Outcome: Progressing  Problem: Activity: Goal: Ability to perform//tolerate increased activity and mobilize with assistive devices will improve Outcome: Progressing

## 2023-09-23 DIAGNOSIS — K611 Rectal abscess: Secondary | ICD-10-CM | POA: Diagnosis not present

## 2023-09-23 LAB — CBC WITH DIFFERENTIAL/PLATELET
Abs Immature Granulocytes: 0.03 10*3/uL (ref 0.00–0.07)
Basophils Absolute: 0 10*3/uL (ref 0.0–0.1)
Basophils Relative: 1 %
Eosinophils Absolute: 0.2 10*3/uL (ref 0.0–0.5)
Eosinophils Relative: 2 %
HCT: 27.2 % — ABNORMAL LOW (ref 39.0–52.0)
Hemoglobin: 8.7 g/dL — ABNORMAL LOW (ref 13.0–17.0)
Immature Granulocytes: 0 %
Lymphocytes Relative: 24 %
Lymphs Abs: 1.8 10*3/uL (ref 0.7–4.0)
MCH: 28.1 pg (ref 26.0–34.0)
MCHC: 32 g/dL (ref 30.0–36.0)
MCV: 87.7 fL (ref 80.0–100.0)
Monocytes Absolute: 0.6 10*3/uL (ref 0.1–1.0)
Monocytes Relative: 8 %
Neutro Abs: 5 10*3/uL (ref 1.7–7.7)
Neutrophils Relative %: 65 %
Platelets: 423 10*3/uL — ABNORMAL HIGH (ref 150–400)
RBC: 3.1 MIL/uL — ABNORMAL LOW (ref 4.22–5.81)
RDW: 15.7 % — ABNORMAL HIGH (ref 11.5–15.5)
WBC: 7.6 10*3/uL (ref 4.0–10.5)
nRBC: 0 % (ref 0.0–0.2)

## 2023-09-23 LAB — BASIC METABOLIC PANEL
Anion gap: 10 (ref 5–15)
BUN: 13 mg/dL (ref 8–23)
CO2: 22 mmol/L (ref 22–32)
Calcium: 8.2 mg/dL — ABNORMAL LOW (ref 8.9–10.3)
Chloride: 105 mmol/L (ref 98–111)
Creatinine, Ser: 0.81 mg/dL (ref 0.61–1.24)
GFR, Estimated: >60 mL/min (ref >60)
Glucose, Bld: 114 mg/dL — ABNORMAL HIGH (ref 70–99)
Potassium: 3.8 mmol/L (ref 3.5–5.1)
Sodium: 137 mmol/L (ref 135–145)

## 2023-09-23 LAB — GLUCOSE, CAPILLARY
Glucose-Capillary: 137 mg/dL — ABNORMAL HIGH (ref 70–99)
Glucose-Capillary: 158 mg/dL — ABNORMAL HIGH (ref 70–99)

## 2023-09-23 MED ORDER — INSULIN ASPART 100 UNIT/ML IJ SOLN: 0.0000 [IU] | Freq: Three times a day (TID) | INTRAMUSCULAR | Status: AC

## 2023-09-23 MED ORDER — OXYCODONE HCL 5 MG PO TABS: 5.0000 mg | ORAL_TABLET | Freq: Four times a day (QID) | ORAL | 0 refills | Status: AC | PRN

## 2023-09-23 MED ORDER — JUVEN PO PACK: 1.0000 | PACK | Freq: Two times a day (BID) | ORAL | Status: AC

## 2023-09-23 MED ORDER — INSULIN GLARGINE 100 UNIT/ML ~~LOC~~ SOLN: 30.0000 [IU] | Freq: Every day | SUBCUTANEOUS | 11 refills | Status: DC

## 2023-09-23 MED ORDER — DOCUSATE SODIUM 100 MG PO CAPS: 100.0000 mg | ORAL_CAPSULE | Freq: Two times a day (BID) | ORAL | Status: AC

## 2023-09-23 MED ORDER — ALPRAZOLAM 0.5 MG PO TABS: 0.5000 mg | ORAL_TABLET | Freq: Every day | ORAL | 0 refills | Status: AC

## 2023-09-23 MED ORDER — VITAMIN C 1000 MG PO TABS: 1000.0000 mg | ORAL_TABLET | Freq: Every day | ORAL | Status: AC

## 2023-09-23 MED ORDER — INSULIN ASPART 100 UNIT/ML IJ SOLN
0.0000 [IU] | Freq: Every day | INTRAMUSCULAR | Status: AC
Start: 2023-09-23 — End: ?

## 2023-09-23 MED ORDER — MAGNESIUM HYDROXIDE 400 MG/5ML PO SUSP: 30.0000 mL | Freq: Every day | ORAL | Status: AC | PRN

## 2023-09-23 MED ORDER — MEDIHONEY WOUND/BURN DRESSING EX PSTE: 1.0000 | PASTE | Freq: Every day | CUTANEOUS | Status: AC

## 2023-09-23 MED ORDER — INSULIN GLARGINE 100 UNIT/ML ~~LOC~~ SOLN: 30.0000 [IU] | Freq: Every day | SUBCUTANEOUS | Status: AC

## 2023-09-23 MED ORDER — ACETAMINOPHEN 325 MG PO TABS
650.0000 mg | ORAL_TABLET | Freq: Four times a day (QID) | ORAL | Status: AC | PRN
Start: 2023-09-23 — End: ?

## 2023-09-23 MED ORDER — SENNOSIDES-DOCUSATE SODIUM 8.6-50 MG PO TABS: 1.0000 | ORAL_TABLET | Freq: Two times a day (BID) | ORAL | Status: AC

## 2023-09-23 MED ORDER — BISACODYL 5 MG PO TBEC: 5.0000 mg | DELAYED_RELEASE_TABLET | Freq: Every day | ORAL | Status: AC | PRN

## 2023-09-23 MED ORDER — POLYETHYLENE GLYCOL 3350 17 G PO PACK: 17.0000 g | PACK | Freq: Two times a day (BID) | ORAL | Status: AC

## 2023-09-23 MED ORDER — INSULIN ASPART 100 UNIT/ML IJ SOLN
0.0000 [IU] | Freq: Three times a day (TID) | INTRAMUSCULAR | 11 refills | Status: DC
Start: 2023-09-23 — End: 2023-09-23

## 2023-09-23 MED ORDER — DOCUSATE SODIUM 100 MG PO CAPS: 100.0000 mg | ORAL_CAPSULE | Freq: Two times a day (BID) | ORAL | 0 refills | Status: DC

## 2023-09-23 MED ORDER — ZINC SULFATE 220 (50 ZN) MG PO CAPS: 220.0000 mg | ORAL_CAPSULE | Freq: Every day | ORAL | Status: AC

## 2023-09-23 MED ORDER — TAMSULOSIN HCL 0.4 MG PO CAPS: 0.8000 mg | ORAL_CAPSULE | Freq: Every day | ORAL | Status: AC

## 2023-09-23 MED ORDER — INSULIN ASPART 100 UNIT/ML IJ SOLN
0.0000 [IU] | Freq: Every day | INTRAMUSCULAR | 11 refills | Status: DC
Start: 2023-09-23 — End: 2023-09-23

## 2023-09-23 NOTE — Progress Notes (Signed)
 Orthopedic Tech Progress Note Patient Details:  Henry Bryant Oct 29, 1960 [id number]  Ortho Devices Type of Ortho Device: Postop shoe/boot Ortho Device/Splint Location: BLE Ortho Device/Splint Interventions: Ordered Patient made it very clear he did not want the POST OP SHOES, stating it would make him fall. RN said patient had to have them to work with PT before getting discharged to SNF   Post Interventions Patient Tolerated: Other (comment) Instructions Provided: Care of device  [name] 09/23/2023, 11:27 AM

## 2023-09-23 NOTE — TOC Transition Note (Signed)
 Transition of Care [facility]) - Discharge Note   Patient Details  Name: Henry Bryant MRN: [id number] Date of Birth: 23-Apr-1961  Transition of Care [facility]) CM/SW Contact:  Erin Sons, LCSW Phone Number: 09/23/2023, 2:35 PM   Clinical Narrative:     [facility] has received SNF authorization and can admit pt today. Treatment team notified.  Fl2 and DC summary sent to facility. RN to call report to [phone]. DC packet on chart. Non emergency ambulance has been scheduled.   TOC will sign off.   Final next level of care: Skilled Nursing Facility Barriers to Discharge: No Barriers Identified   Patient Goals and CMS Choice Patient states their goals for this hospitalization and ongoing recovery are:: Home CMS Medicare.gov Compare Post Acute Care list provided to:: Patient Choice offered to / list presented to : Patient [place] ownership interest in [facility].provided to:: Patient           Discharge Plan and Services Additional resources added to the After Visit Summary for   In-house Referral: Clinical Social Work                                   Social Drivers of Health (SDOH) Interventions SDOH Screenings   Food Insecurity: No Food Insecurity (09/06/2023)  Recent Concern: Food Insecurity - Food Insecurity Present (08/11/2023)   Received from [facility]  Housing: Low Risk  (09/06/2023)  Transportation Needs: No Transportation Needs (09/06/2023)  Utilities: Not At Risk (09/06/2023)  Financial Resource Strain: Low Risk  (08/11/2023)   Received from [facility]  Physical Activity: Inactive (08/11/2023)   Received from [facility]  Social Connections: Socially Integrated (08/11/2023)   Received from [facility]  Stress: No Stress Concern Present (08/11/2023)   Received from [facility]  Tobacco Use: Medium Risk (09/12/2023)  Health Literacy: Medium Risk (08/11/2023)   Received from [facility]     Readmission Risk Interventions      No data to display

## 2023-09-23 NOTE — Progress Notes (Signed)
 Physical Therapy Treatment Patient Details Name: Henry Bryant MRN: [id number] DOB: Apr 12, 1961 Today's Date: 09/23/2023   History of Present Illness 63 y.o. male admitted 09/06/23 from [facility] with rectal pain and purulent discharge, falls. 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 Chair setup, max assist to don/doff post op shoes supine and pt able to transition to sitting. Pt reports dizziness EOB but denied attempting further sitting to stabilize and perform HEP with pt returning to supine despite therapist attempts to progress transfers with standing or lateral scoot to chair. Therapist educated pt for positioning with increased time with HOB elevated and HEP. Will continue to follow. Patient will benefit from continued inpatient follow up therapy, <3 hours/day     If plan is discharge home, recommend the following: A little help with bathing/dressing/bathroom;Assistance with cooking/housework;Assist for transportation;Help with stairs or ramp for entrance;Two people to help with walking and/or transfers   Can travel by private vehicle     No  Equipment Recommendations  None recommended by PT    Recommendations for Other Services       Precautions / Restrictions Precautions Precautions: Fall;Other (comment) Recall of Precautions/Restrictions: Intact Precaution/Restrictions Comments: check bp Required Braces or Orthoses: Other Brace Other Brace: Bil post op shoes Restrictions RLE Weight Bearing Per Provider Order: Weight bearing as tolerated LLE Weight Bearing Per Provider Order: Weight bearing as tolerated      Mobility  Bed Mobility Overal bed mobility: Modified Independent             General bed mobility comments: pt able to transition supine<>sit without physical assist and pull himself up in bed with head board    Transfers                   General transfer comment: pt reports dizziness EOB, sat grossly 30 sec then returned to supine and denied futher attempt at mobility despite stating wanting to walk    Ambulation/Gait                   Stairs             Wheelchair Mobility     Tilt Bed    Modified Rankin (Stroke Patients Only)       Balance Overall balance assessment: Needs assistance Sitting-balance support: No upper extremity supported, Feet supported Sitting balance-Leahy Scale: Fair Sitting balance - Comments: EOB with supervision                                    Communication Communication Communication: No apparent difficulties  Cognition Arousal: Alert Behavior During Therapy: WFL for tasks assessed/performed   PT - Cognitive impairments: No family/caregiver present to determine baseline, Safety/Judgement                       PT - Cognition Comments: pt reports wanting to get OOB but once sitting denied any further sitting or progression due to fear once feeling dizzy Following commands: Intact      Cueing Cueing Techniques: Verbal  cues  Exercises General Exercises - Lower Extremity Short Arc Quad: AROM, Both, 15 reps, Supine, Strengthening Hip ABduction/ADduction: AROM, Both, 15 reps, Supine, Strengthening Heel Raises: AROM, Both, Supine, 10 reps, Strengthening    General Comments        Pertinent Vitals/Pain Pain Assessment Pain Score: 3  Pain Location: sacrum Pain Descriptors / Indicators: Aching Pain Intervention(s): Limited activity within patient's tolerance, Monitored during session    Home Living                          Prior Function            PT Goals  (current goals can now be found in the care plan section) Progress towards PT goals: Not progressing toward goals - comment (pt denied progression from EOB)    Frequency    Min 2X/week      PT Plan      Co-evaluation              AM-PAC PT "6 Clicks" Mobility   Outcome Measure  Help needed turning from your back to your side while in a flat bed without using bedrails?: None Help needed moving from lying on your back to sitting on the side of a flat bed without using bedrails?: None Help needed moving to and from a bed to a chair (including a wheelchair)?: Total Help needed standing up from a chair using your arms (e.g., wheelchair or bedside chair)?: Total Help needed to walk in hospital room?: Total Help needed climbing 3-5 steps with a railing? : Total 6 Click Score: 12    End of Session   Activity Tolerance: Patient limited by fatigue Patient left: in bed;with call bell/phone within reach Nurse Communication: Mobility status PT Visit Diagnosis: Other abnormalities of gait and mobility (R26.89);Muscle weakness (generalized) (M62.81)     Time: 1610-9604 PT Time Calculation (min) (ACUTE ONLY): 23 min  Charges:    $Therapeutic Exercise: 8-22 mins $Therapeutic Activity: 8-22 mins PT General Charges $$ ACUTE PT VISIT: 1 Visit                     Merryl Hacker, PT Acute Rehabilitation Services Office: [phone]    Enedina Finner [place] 09/23/2023, 1:26 PM

## 2023-09-23 NOTE — Discharge Summary (Addendum)
 Physician Discharge Summary   Patient: Henry Bryant MRN: [id number] DOB: 1960-07-28  Admit date:     09/06/2023  Discharge date: 09/23/23  Discharge Physician: Jonah Blue   PCP: Toma Deiters, MD   Recommendations at discharge:   You are being discharged to [facility] for rehabilitation Follow up with Dr. 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)   COPD GOLD II with reversibility    Essential hypertension   Coronary artery disease   Chronic systolic CHF (congestive heart failure) (HCC) - 20-25% by echo 06-2023 at [facility]   Atypical atrial flutter (HCC)   Uncontrolled type 2 diabetes mellitus with hyperglycemia, without long-term current use of insulin (HCC)   Subacute osteomyelitis of foot (HCC)   Peripheral artery disease [facility])   Hospital Course: 63yo with h/o DM, chronic HRrEF, HTN, pulmonary HTN, afib, recent B toe amputation, and urinary retention with self-catheterization who presented on 3/1 with rectal pain and purulent discharge.  He was found to have a rectal abscess with Klebsiella pneumoniae bacteremia (also UTI) s/p I&D (OSH); surgery has consulted and he has completed antibiotic therapy.  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  He is medically stable and awaiting SNF placement.  Assessment and Plan:  Sepsis due to rectal abscess Emphysematous cystitis, Klebsiella UTI Klebsiella  pneumoniae bacteremia -Imaging at [facility] had shown large loculated collection in the left buttocks and perirectal tissues, underwent I&D there, catheter was placed into the abscess cavity to help facilitate drainage. -CT abdomen 09/08/2023 revealed significant improvement in the left perirectal and left buttock region compared to prior exam.  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 -Seen by surgery, Dr. Dwain Sarna, recommended discharge on 10 days of antibiotics and follow-up in [facility] with [facility] surgeon -Patient was on IV Unasyn -> Augmentin and doxycycline.  Patient has completed antibiotics.   -Blood cultures at [facility] positive for Klebsiella pneumonia, urine culture here on 3/1 positive for Klebsiella -Seen by urology on 09/08/2023, Dr. Lafonda Mosses.  Urology team recommended continuing Foley catheter -Ongoing rectal pain noted -Surgery reconsulted, unremarkable evaluation, likely worsened by constipation -needs better bowel regimen, wean opiates to improve constipation (stop IV, taper PO) -Surgery has signed off at this time   Diabetic ulcer of toes, osteomyelitis bilateral first metatarsals -Prior toe amputation at [facility] in 08/2023.   -MRI of the toes showed prior amputation of the great toe with osteomyelitis of the distal half of the first metatarsal on the left and right, no abscess.  -Vascular surgery requested orthopedic opinion.   -Seen by Dr. Lajoyce Corners, recommended first ray and second toe amputation bilaterally, and dressing changes to both knees -Patient underwent surgery on 09/12/2023 -B wound vacs removed on 3/13 due to R foot with increasing drainage and new pain overnight -Dr. Lajoyce Corners saw the patient this AM and he is stable for dc to SNF with WBAT status   Peripheral arterial disease with history of revascularization -Followed  by vascular surgery outpatient, last saw Dr. Hetty Blend, underwent lower extremity angiogram with revascularization in 06/2023  -Given poor healing and findings  of osteomyelitis, vascular surgery recommended orthopedics consult on admission and he underwent amputation   Insulin-dependent diabetes mellitus, uncontrolled with with hyperglycemia -hemoglobin A1c 6.8 on 09/06/2023, good control -Continue Lantus 30 units at bedtime -Hold Jardiance -SSI moderate  -Liberalized diet at request of pharmacy   Chronic systolic CHF -euvolemic -Continue Toprol-XL, low-dose Entresto, Crestor  -Hold spironolactone   Atrial flutter -Rate controlled with amiodarone, metoprolol -Resume Eliquis   Anxiety -Continue alprazolam   Urinary retention -Has been doing self-cath q5h at home -Currently has a foley, which urology recommends leaving in -Continue tamsulosin, increased dose on 3/16       Consultants: Surgery Urology Vascular surgery ID   Procedures: Amputations of B 1st rays and 2nd ray of R foot, 3/7   Antibiotics: Unasyn -> Augmentin x 7 days Cefazolin x 1 dose Doxycycline x 7 days Zosyn x 3 days Vancomycin x 3 days   Pain control - [place] Controlled Substance Reporting System database was reviewed. and patient was instructed, not to drive, operate heavy machinery, perform activities at heights, swimming or participation in water activities or provide baby-sitting services while on Pain, Sleep and Anxiety Medications; until their outpatient Physician has advised to do so again. Also recommended to not to take more than prescribed Pain, Sleep and Anxiety Medications.    Disposition: Skilled nursing facility Diet recommendation:  Cardiac and Carb modified diet DISCHARGE MEDICATION: Allergies as of 09/23/2023       Reactions   Avelox [moxifloxacin Hcl In Nacl] Shortness Of Breath, Itching   Codeine Shortness Of Breath, Itching   Meclizine Other (See Comments)   Vertigo worsens   Potassium Nausea Only        Medication List     STOP taking these medications    amoxicillin-clavulanate 875-125 MG tablet Commonly known  as: AUGMENTIN   doxycycline 100 MG tablet Commonly known as: VIBRA-TABS   Lantus SoloStar 100 UNIT/ML Solostar Pen Generic drug: insulin glargine Replaced by: insulin glargine 100 UNIT/ML injection   sildenafil 20 MG tablet Commonly known as: REVATIO   spironolactone 25 MG tablet Commonly known as: ALDACTONE       TAKE these medications    acetaminophen 325 MG tablet Commonly known as: TYLENOL Take 2 tablets (650 mg total) by mouth every 6 (six) hours as needed for mild pain (pain score 1-3) (or Fever >/= 101).   ALPRAZolam 0.5 MG tablet Commonly known as: XANAX Take 1 tablet (0.5 mg total) by mouth at bedtime.   amiodarone 200 MG tablet Commonly known as: PACERONE Take 200 mg by mouth daily.   apixaban 5 MG Tabs tablet Commonly known as: ELIQUIS Take 5 mg by mouth 2 (two) times daily.   aspirin EC 81 MG tablet Take 81 mg by mouth in the morning. Swallow whole.   bisacodyl 5 MG EC tablet Commonly known as: DULCOLAX Take 1 tablet (5 mg total) by mouth daily as needed for moderate constipation.   docusate sodium 100 MG capsule Commonly known as: COLACE Take 1 capsule (100 mg total) by mouth 2 (two) times daily.   Fish Oil 1000 MG Caps Take 1,000 mg by mouth daily.   insulin aspart 100 UNIT/ML injection Commonly known as: novoLOG Inject 0-15 Units into the skin 3 (three) times daily with meals.   insulin aspart 100 UNIT/ML injection Commonly known as: novoLOG Inject 0-5 Units  into the skin at bedtime.   insulin glargine 100 UNIT/ML injection Commonly known as: LANTUS Inject 0.3 mLs (30 Units total) into the skin at bedtime. Replaces: Lantus SoloStar 100 UNIT/ML Solostar Pen   Jardiance 10 MG Tabs tablet Generic drug: empagliflozin Take 10 mg by mouth daily.   leptospermum manuka honey Pste paste Apply 1 Application topically daily. Start taking on: September 24, 2023   magnesium hydroxide 400 MG/5ML suspension Commonly known as: MILK OF MAGNESIA Take  30 mLs by mouth daily as needed (refractory constipation).   metoprolol succinate 25 MG 24 hr tablet Commonly known as: TOPROL-XL Take 25 mg by mouth daily.   nutrition supplement (JUVEN) Pack Take 1 packet by mouth 2 (two) times daily between meals. Start taking on: September 24, 2023   oxyCODONE 5 MG immediate release tablet Commonly known as: Oxy IR/ROXICODONE Take 1 tablet (5 mg total) by mouth every 6 (six) hours as needed for severe pain (pain score 7-10) or moderate pain (pain score 4-6).   pantoprazole 40 MG tablet Commonly known as: PROTONIX Take 1 tablet (40 mg total) by mouth daily. What changed: when to take this   polyethylene glycol 17 g packet Commonly known as: MIRALAX / GLYCOLAX Take 17 g by mouth 2 (two) times daily.   rosuvastatin 40 MG tablet Commonly known as: CRESTOR Take 40 mg by mouth daily.   sacubitril-valsartan 97-103 MG Commonly known as: ENTRESTO Take 1 tablet by mouth 2 (two) times daily.   senna-docusate 8.6-50 MG tablet Commonly known as: Senokot-S Take 1 tablet by mouth 2 (two) times daily.   tamsulosin 0.4 MG Caps capsule Commonly known as: FLOMAX Take 2 capsules (0.8 mg total) by mouth daily. Start taking on: September 24, 2023 What changed: how much to take   vitamin C 1000 MG tablet Take 1 tablet (1,000 mg total) by mouth daily. What changed: when to take this   zinc sulfate (50mg  elemental zinc) 220 (50 Zn) MG capsule Take 1 capsule (220 mg total) by mouth daily for 2 days. Start taking on: September 24, 2023               Discharge Care Instructions  (From admission, onward)           Start     Ordered   09/23/23 0000  Discharge wound care:       Comments: Cleanse bilat great toe and bilat knee wounds with Vashe wound cleanser Q day [facility] [place] [phone]), apply Vashe moistened gauze to wound beds daily. Apply Medihoney to wound beds Q day, cover with silicone foam or gauze and Kerlix roll gauze whichever is preferred.   09/23/23  1313            Contact information for follow-up providers     Nadara Mustard, MD Follow up in 1 week(s).   Specialty: Orthopedic Surgery Contact information: [address] [place] [address] [phone]              Contact information for after-discharge care     Destination     [facility] .   Service: Skilled Nursing Contact information: [address] [place] [address] [phone]                    Discharge Exam:   Subjective: He is upset about going to [facility] for rehab and is considering options - leaving to go live in a motel, appealing discharge.  Objective: Vitals:   09/23/23 0828 09/23/23 1005  BP: 121/65 (!) 106/55  Pulse: 63 65  Resp: 18   Temp: 97.9 F (36.6 C)   SpO2: 100%     Intake/Output Summary (Last 24 hours) at 09/23/2023 1542 Last data filed at 09/23/2023 0430 Gross per 24 hour  Intake --  Output 250 ml  Net -250 ml   Filed Weights   09/06/23 1751 09/12/23 0859  Weight: 63.8 kg 63.5 kg    Exam:  General:  Appears calm and comfortable and is in NAD Eyes:  EOMI, normal lids, iris ENT:  grossly normal hearing, lips & tongue, mmm Neck:  no LAD, masses or thyromegaly Cardiovascular:  RRR, no m/r/g. No LE edema.  Respiratory:   CTA bilaterally with no wheezes/rales/rhonchi.  Normal respiratory effort. Abdomen:  soft, NT, ND Skin:  s/p B toe/ray amputations with clean dressings/wraps at this time, Prevalon boots in place Musculoskeletal:  grossly normal tone BUE/BLE, good ROM, s/p B toe amputations Psychiatric:  blunted mood and affect, speech fluent and appropriate, AOx3 Neurologic:  CN 2-12 grossly intact, moves all extremities in coordinated fashion  Data Reviewed: I have reviewed the patient's lab results since admission.  Pertinent labs for today include:   Glucose 114 WBC 7.6 Hgb 8.7, stable Platelets 423, improved    Condition at discharge:  stable  The results of significant diagnostics from this hospitalization (including imaging, microbiology, ancillary and laboratory) are listed below for reference.   Imaging Studies: DG Abd Portable 1V Result Date: 09/18/2023 CLINICAL DATA:  Fecal impaction EXAM: PORTABLE ABDOMEN - 1 VIEW COMPARISON:  X-ray 08/29/2023.  CT 09/08/2023. FINDINGS: Moderate colonic stool along the left side. The large bowel is nondilated. There are numerous air-filled loops of small bowel mid abdomen. A few loops measure up to 3 cm, borderline dilated. Of note the extreme caudal aspect of the pelvis is clipped off the edge of the film. IMPRESSION: Borderline dilated air-filled loops of small bowel throughout the midabdomen. The large bowel has a normal caliber with some scattered moderate stool. Electronically Signed   By: Karen Kays M.D.   On: 09/18/2023 16:04   VAS [place] ABI WITH/WO TBI Result Date: 09/10/2023  LOWER EXTREMITY DOPPLER STUDY Patient Name:  Henry Bryant  Date of Exam:   09/10/2023 Medical Rec #: [id number]         Accession #:    [id number] Date of Birth: 18-Aug-1960          Patient Gender: M Patient Age:   10 years Exam Location:  [facility] Procedure:      VAS [place] ABI WITH/WO TBI Referring Phys: Emilie Rutter --------------------------------------------------------------------------------  Indications: Peripheral artery disease. BLE great toe amputations recently, non              healing surgical site. High Risk Factors: Hypertension, hyperlipidemia, coronary artery disease. Other Factors: CHF,.  Vascular               RLE ateriogram w/ TPT & PT balloon angioplasty Interventions:         07/07/2023. Comparison Study: 06/26/2023 @[facility]  Healthcare Performing Technologist: Ernestene Mention RVT, RDMS  Examination Guidelines: A complete evaluation includes at minimum, Doppler waveform signals and systolic blood pressure reading at the level of bilateral brachial, anterior tibial, and posterior tibial arteries,  when vessel segments are accessible. Bilateral testing is considered an integral part of a complete examination. Photoelectric Plethysmograph (PPG) waveforms and toe systolic pressure readings are included as required  and additional duplex testing as needed. Limited examinations for reoccurring indications may be performed as noted.  ABI Findings: +---------+------------------+-----+----------+--------+ Right    Rt Pressure (mmHg)IndexWaveform  Comment  +---------+------------------+-----+----------+--------+ Brachial 130                    triphasic          +---------+------------------+-----+----------+--------+ PTA      87                0.63 monophasic         +---------+------------------+-----+----------+--------+ DP       0                 0.00 absent             +---------+------------------+-----+----------+--------+ Great Toe                                 amp.     +---------+------------------+-----+----------+--------+ +--------+------------------+-----+-----------------+-------+ Left    Lt Pressure (mmHg)IndexWaveform         Comment +--------+------------------+-----+-----------------+-------+ [id number]                    triphasic                +--------+------------------+-----+-----------------+-------+ PTA     139               1.00 audibly triphasic        +--------+------------------+-----+-----------------+-------+ DP      131               0.94 monophasic             +--------+------------------+-----+-----------------+-------+ +-------+-----------+-----------+------------+------------+ ABI/TBIToday's ABIToday's TBIPrevious ABIPrevious TBI +-------+-----------+-----------+------------+------------+ Right  0.63       amputation 0.54        0.34         +-------+-----------+-----------+------------+------------+ Left   1.00       amputation 0.77        0.27          +-------+-----------+-----------+------------+------------+   difficulty obtaining waveform due to patient position and bandages.  Summary: Right: Resting right ankle-brachial index indicates moderate right lower extremity arterial disease. Left: Resting left ankle-brachial index is within normal range. *See table(s) above for measurements and observations.  Electronically signed by Sherald Hess MD on 09/10/2023 at 12:52:12 PM.    Final    VAS [place] LOWER EXTREMITY ARTERIAL DUPLEX Result Date: 09/10/2023 LOWER EXTREMITY ARTERIAL DUPLEX STUDY Patient Name:  Henry Bryant  Date of Exam:   09/09/2023 Medical Rec #: [id number]         Accession #:    [id number] Date of Birth: 03-13-1961          Patient Gender: M Patient Age:   68 years Exam Location:  [facility] Procedure:      VAS [place] LOWER EXTREMITY ARTERIAL DUPLEX Referring Phys: Emilie Rutter --------------------------------------------------------------------------------  Indications: Peripheral artery disease. High Risk Factors: Hypertension, hyperlipidemia, Diabetes, past history of                    smoking, coronary artery disease. Other Factors: CHF,.  Vascular               RLE ateriogram w/ TPT & PT balloon angioplasty Interventions:         07/07/2023. Current ABI:           n/a Limitations:  Difficult exam due to habitus Comparison Study: No previous arterial duplex exams Performing Technologist: Jody Hill RVT, RDMS  Examination Guidelines: A complete evaluation includes B-mode imaging, spectral Doppler, color Doppler, and power Doppler as needed of all accessible portions of each vessel. Bilateral testing is considered an integral part of a complete examination. Limited examinations for reoccurring indications may be performed as noted.  +-----------+--------+-----+---------------+-------------------+--------------+ RIGHT      PSV cm/sRatioStenosis       Waveform           Comments        +-----------+--------+-----+---------------+-------------------+--------------+ CFA Mid    118                         biphasic                          +-----------+--------+-----+---------------+-------------------+--------------+ DFA        95                          biphasic                          +-----------+--------+-----+---------------+-------------------+--------------+ SFA Prox   112                         biphasic                          +-----------+--------+-----+---------------+-------------------+--------------+ SFA Mid    95                          biphasic                          +-----------+--------+-----+---------------+-------------------+--------------+ SFA Distal 77                          biphasic                          +-----------+--------+-----+---------------+-------------------+--------------+ POP Prox   42                          biphasic                          +-----------+--------+-----+---------------+-------------------+--------------+ POP Mid    51                          biphasic                          +-----------+--------+-----+---------------+-------------------+--------------+ POP Distal 32                          biphasic                          +-----------+--------+-----+---------------+-------------------+--------------+ TP Trunk   32                          biphasic                          +-----------+--------+-----+---------------+-------------------+--------------+  ATA Prox   285          75-99% stenosisbiphasic                          +-----------+--------+-----+---------------+-------------------+--------------+ ATA Mid    91                          monophasic                        +-----------+--------+-----+---------------+-------------------+--------------+ ATA Distal              occluded                                          +-----------+--------+-----+---------------+-------------------+--------------+ PTA Prox   24                          monophasic                        +-----------+--------+-----+---------------+-------------------+--------------+ PTA Mid    17                          dampened monophasic               +-----------+--------+-----+---------------+-------------------+--------------+ PTA Distal 31                          dampened monophasic               +-----------+--------+-----+---------------+-------------------+--------------+ PERO Prox                                                 not visualized +-----------+--------+-----+---------------+-------------------+--------------+ PERO Mid   52                          monophasic                        +-----------+--------+-----+---------------+-------------------+--------------+ PERO Distal17                          dampened monophasic               +-----------+--------+-----+---------------+-------------------+--------------+ DP         5                           dampened monophasic               +-----------+--------+-----+---------------+-------------------+--------------+  +-----------+--------+-----+--------+----------+-----------------------+ LEFT       PSV cm/sRatioStenosisWaveform  Comments                +-----------+--------+-----+--------+----------+-----------------------+ CFA Mid    118                  biphasic                          +-----------+--------+-----+--------+----------+-----------------------+ DFA        59  biphasic                          +-----------+--------+-----+--------+----------+-----------------------+ SFA Prox   112                  biphasic                          +-----------+--------+-----+--------+----------+-----------------------+ SFA Mid    66                   biphasic                           +-----------+--------+-----+--------+----------+-----------------------+ SFA Distal 38                   biphasic                          +-----------+--------+-----+--------+----------+-----------------------+ POP Prox   50                   biphasic                          +-----------+--------+-----+--------+----------+-----------------------+ POP Distal 38                   biphasic                          +-----------+--------+-----+--------+----------+-----------------------+ TP Trunk   41                   biphasic                          +-----------+--------+-----+--------+----------+-----------------------+ ATA Prox   26                   biphasic                          +-----------+--------+-----+--------+----------+-----------------------+ ATA Mid                 occluded          short segment occlusion +-----------+--------+-----+--------+----------+-----------------------+ ATA Distal 24                   monophasicretrograde flow         +-----------+--------+-----+--------+----------+-----------------------+ PTA Prox   43                   biphasic                          +-----------+--------+-----+--------+----------+-----------------------+ PTA Mid    65                   biphasic                          +-----------+--------+-----+--------+----------+-----------------------+ PTA Distal 63                   biphasic                          +-----------+--------+-----+--------+----------+-----------------------+ PERO Prox  not visualized          +-----------+--------+-----+--------+----------+-----------------------+ PERO Mid   38                   biphasic                          +-----------+--------+-----+--------+----------+-----------------------+ PERO Distal43                   biphasic                           +-----------+--------+-----+--------+----------+-----------------------+ DP         17                   biphasic  retrograde              +-----------+--------+-----+--------+----------+-----------------------+  Summary: Right: 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 Distal anterior tibial artery appears occluded. Left: Short segment occlusion noted in the mid anterior tibial artery with retrograde flow seen distally.  See table(s) above for measurements and observations. Electronically signed by Sherald Hess MD on 09/10/2023 at 7:26:12 AM.    Final    CT ABDOMEN PELVIS W CONTRAST Result Date: 09/08/2023 CLINICAL DATA:  History of emphysematous cystitis and large left-sided perirectal abscess with recent incision and drainage, initial encounter EXAM: CT ABDOMEN AND PELVIS WITH CONTRAST TECHNIQUE: Multidetector CT imaging of the abdomen and pelvis was performed using the standard protocol following bolus administration of intravenous contrast. RADIATION DOSE REDUCTION: This exam was performed according to the departmental dose-optimization program which includes automated exposure control, adjustment of the mA and/or kV according to patient size and/or use of iterative reconstruction technique. CONTRAST:  80mL OMNIPAQUE IOHEXOL 300 MG/ML SOLN, 30mL OMNIPAQUE IOHEXOL 300 MG/ML SOLN COMPARISON:  None Available. FINDINGS: Lower chest: Lung bases are free of acute infiltrate or sizable effusion. Hepatobiliary: No focal liver abnormality is seen. No gallstones, gallbladder wall thickening, or biliary dilatation. Pancreas: Unremarkable. No pancreatic ductal dilatation or surrounding inflammatory changes. Spleen: Normal in size without focal abnormality. Adrenals/Urinary Tract: Adrenal glands are within normal limits. Right kidney shows a normal enhancement pattern. Some slight delay in enhancement is noted on the left. Some mild fullness of the left collecting system is noted. No discrete  calculus is identified. The overall appearance is slightly improved when compare with the prior exam related to interval decompression of the bladder. The right collecting system is within normal limits. Contrast material was instilled into the bladder for CT cystogram. No findings to suggest extravasation are identified. A small amount of air is noted within the bladder related to the recent instrumentation. 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 Stomach/Bowel: No obstructive or inflammatory changes of the colon are seen. The appendix is within normal limits. Small bowel and stomach are unremarkable. Vascular/Lymphatic: Aortic atherosclerosis. No enlarged abdominal or pelvic lymph nodes. Reproductive: Prostate is unremarkable. Other: No free air or free fluid is noted within the abdomen. Musculoskeletal: No acute bony abnormality is noted. There remains a perirectal fluid collection on the left extending from the operator musculature medially and posteriorly into the left buttock. The vast majority of the previously seen fluid has been drained consistent with the given clinical history. Small drainage catheter is noted within as well as some hyperdense material in the medial left buttock which may  be related to the gauze packing. The largest area of fluid is noted adjacent to the operator musculature measuring 3.0 by 1.2 cm in greatest dimension. This is a significant improved from the prior exam. IMPRESSION: Significant improvement in the degree of fluid in the left perirectal and left buttock region when compare with the prior exam. Small drainage catheter and gauze packing is noted within. A mild residual fluid collection is noted as described. CT cystogram shows no evidence of extravasation. The findings of emphysematous cystitis have resolved in the interval. Some residual bladder wall thickening is noted.  Mild fullness in the left renal collecting system although improved from the prior exam follow-up decompression of the bladder. Electronically Signed   By: Alcide Clever M.D.   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 RADIATION DOSE REDUCTION: This exam was performed according to the departmental dose-optimization program which includes automated exposure control, adjustment of the mA and/or kV according to patient size and/or use of iterative reconstruction technique. CONTRAST:  80mL OMNIPAQUE IOHEXOL 300 MG/ML SOLN, 30mL OMNIPAQUE IOHEXOL 300 MG/ML SOLN COMPARISON:  None Available. FINDINGS: Lower chest: Lung bases are free of acute infiltrate or sizable effusion. Hepatobiliary: No focal liver abnormality is seen. No gallstones, gallbladder wall thickening, or biliary dilatation. Pancreas: Unremarkable. No pancreatic ductal dilatation or surrounding inflammatory changes. Spleen: Normal in size without focal abnormality. Adrenals/Urinary Tract: Adrenal glands are within normal limits. Right kidney shows a normal enhancement pattern. Some slight delay in enhancement is noted on the left. Some mild fullness of the left collecting system is noted. No discrete calculus is identified. The overall appearance is slightly improved when compare with the prior exam related to interval decompression of the bladder. The right collecting system is within normal limits. Contrast material was instilled into the bladder for CT cystogram. No findings to suggest extravasation are identified. A small amount of air is noted within the bladder related to the recent  instrumentation. 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 Stomach/Bowel: No obstructive or inflammatory changes of the colon are seen. The appendix is within normal limits. Small bowel and stomach are unremarkable. Vascular/Lymphatic: Aortic atherosclerosis. No enlarged abdominal or pelvic lymph nodes. Reproductive: Prostate is unremarkable. Other: No free air or free fluid is noted within the abdomen. Musculoskeletal: No acute bony abnormality is noted. There remains a perirectal fluid collection on the left extending from the operator musculature medially and posteriorly into the left buttock. The vast majority of the previously seen fluid has been drained consistent with the given clinical history. Small drainage catheter is noted within as well as some hyperdense material in the medial left buttock which may be related to the gauze packing. The largest area of fluid is noted adjacent to the operator musculature measuring 3.0 by 1.2 cm in greatest dimension. This is a significant improved from the prior exam. IMPRESSION: Significant improvement in the degree of fluid in the left perirectal and left buttock region when compare with the prior exam. Small drainage catheter and gauze packing is noted within. A mild residual fluid collection is noted as described. CT cystogram shows no evidence of extravasation.  The findings of emphysematous cystitis have resolved in the interval. Some residual bladder wall thickening is noted. Mild fullness in the left renal collecting system although improved from the prior exam follow-up decompression of the bladder. Electronically Signed   By: Alcide Clever M.D.   On: 09/08/2023 20:58   CT OUTSIDE FILMS BODY/ABD/PELVIS Result Date: 09/08/2023 This examination belongs to an outside facility and is stored here for comparison purposes only.  Contact the originating outside institution  for any associated report or interpretation.  DG Outside Films Chest Result Date: 09/08/2023 This examination belongs to an outside facility and is stored here for comparison purposes only.  Contact the originating outside institution for any associated report or interpretation.  DG Outside Films Body Result Date: 09/08/2023 This examination belongs to an outside facility and is stored here for comparison purposes only.  Contact the originating outside institution for any associated report or interpretation.  MR TOES LEFT WO CONTRAST Result Date: 09/08/2023 CLINICAL DATA:  Recent great toe amputation with continued purulent drainage. EXAM: MRI OF THE LEFT TOES WITHOUT CONTRAST TECHNIQUE: Multiplanar, multisequence MR imaging of the left forefoot was performed. No intravenous contrast was administered. COMPARISON:  Left foot x-rays dated June 25, 2023. FINDINGS: Bones/Joint/Cartilage Prior amputation of the great toe. Prominent marrow edema with corresponding decreased T1 marrow signal involving the distal half of the first metatarsal. No fracture or dislocation. Joint spaces are preserved. No joint effusion. Ligaments Second through fifth toe collateral ligaments are intact. Lisfranc ligament is intact. Muscles and Tendons Postsurgical changes of the first flexor and extensor tendons. No tenosynovitis. Complete atrophy of the intrinsic foot muscles. Soft tissue Ulceration at the great toe amputation stump with few small foci of subcutaneous emphysema. No fluid collection. No soft tissue mass. IMPRESSION: 1. 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 No abscess. Electronically Signed   By: Obie Dredge M.D.   On: 09/08/2023 08:19   MR TOES RIGHT WO CONTRAST Result Date: 09/08/2023 CLINICAL DATA:  Recent right great toe amputation 4 weeks ago with continued purulent drainage. EXAM: MRI OF THE RIGHT TOES WITHOUT CONTRAST TECHNIQUE: Multiplanar,  multisequence MR imaging of the right forefoot was performed. No intravenous contrast was administered. COMPARISON:  MRI right foot dated July 16, 2023. FINDINGS: Bones/Joint/Cartilage Prior first ray amputation. Prominent marrow edema with corresponding decreased T1 marrow signal involving the remaining first metatarsal, most severe at the distal aspect. No fracture or dislocation. Joint spaces are preserved. No joint effusion. Ligaments Second through fifth toe collateral ligaments are intact. Lisfranc ligament is intact. Muscles and Tendons Postsurgical changes of the first flexor and extensor tendons. No tenosynovitis. Complete atrophy of the intrinsic foot muscles. Soft tissue Ulceration at the first ray amputation stump with small foci subcutaneous emphysema. No fluid collection. No soft tissue mass. IMPRESSION: 1. Prior first ray amputation with osteomyelitis of the remaining first metatarsal. No abscess. Electronically Signed   By: Obie Dredge M.D.   On: 09/08/2023 08:12    Microbiology: Results for orders placed or performed during the hospital encounter of 09/06/23  Culture, blood (Routine X 2) w Reflex to ID Panel     Status: None   Collection Time: 09/06/23  6:26 PM   Specimen: BLOOD RIGHT ARM  Result Value Ref Range Status   Specimen Description   Final    BLOOD RIGHT ARM Performed at [facility], [address], [place], [place] [address]    Special  Requests   Final    BOTTLES DRAWN AEROBIC ONLY Blood Culture adequate volume Performed at [facility], [address], [place], [place] [address]    Culture   Final    NO GROWTH 5 DAYS Performed at [facility], [address], [place], [place] [address]    Report Status 09/11/2023 FINAL  Final  Culture, blood (Routine X 2) w Reflex to ID Panel     Status: Abnormal   Collection Time: 09/06/23  6:26 PM   Specimen: BLOOD RIGHT ARM  Result Value Ref Range Status   Specimen Description   Final     BLOOD RIGHT ARM Performed at [facility], [address], [place], [place] [address]    Special Requests   Final    BOTTLES DRAWN AEROBIC ONLY Blood Culture adequate volume Performed at [facility], [address] [place], [place] [address]    Culture  Setup Time   Final    GRAM NEGATIVE RODS AEROBIC BOTTLE ONLY Organism ID to follow CRITICAL RESULT CALLED TO, READ BACK BY AND VERIFIED WITH: V BRYK,[id number]  09/10/23 MK Performed at [facility], [address], [place], [place] [address]    Culture KLEBSIELLA PNEUMONIAE (A)  Final   Report Status 09/11/2023 FINAL  Final   Organism ID, Bacteria KLEBSIELLA PNEUMONIAE  Final   Organism ID, Bacteria KLEBSIELLA PNEUMONIAE  Final      Susceptibility   Klebsiella pneumoniae - KIRBY BAUER*    CEFAZOLIN SENSITIVE Sensitive    Klebsiella pneumoniae - MIC*    AMPICILLIN >=32 RESISTANT Resistant     CEFEPIME <=0.12 SENSITIVE Sensitive     CEFTAZIDIME <=1 SENSITIVE Sensitive     CEFTRIAXONE <=0.25 SENSITIVE Sensitive     CIPROFLOXACIN <=0.25 SENSITIVE Sensitive     GENTAMICIN <=1 SENSITIVE Sensitive     IMIPENEM <=0.25 SENSITIVE Sensitive     TRIMETH/SULFA <=20 SENSITIVE Sensitive     AMPICILLIN/SULBACTAM 4 SENSITIVE Sensitive     PIP/TAZO 8 SENSITIVE Sensitive ug/mL    * KLEBSIELLA PNEUMONIAE    KLEBSIELLA PNEUMONIAE  Blood Culture ID Panel (Reflexed)     Status: Abnormal   Collection Time: 09/06/23  6:26 PM  Result Value Ref Range Status   Enterococcus faecalis NOT DETECTED NOT DETECTED Final   Enterococcus Faecium NOT DETECTED NOT DETECTED Final   Listeria monocytogenes NOT DETECTED NOT DETECTED Final   Staphylococcus species NOT DETECTED NOT DETECTED Final   Staphylococcus aureus (BCID) NOT DETECTED NOT DETECTED Final   Staphylococcus epidermidis NOT DETECTED NOT DETECTED Final   Staphylococcus lugdunensis NOT DETECTED NOT DETECTED Final   Streptococcus species NOT DETECTED NOT DETECTED Final    Streptococcus agalactiae NOT DETECTED NOT DETECTED Final   Streptococcus pneumoniae NOT DETECTED NOT DETECTED Final   Streptococcus pyogenes NOT DETECTED NOT DETECTED Final   A.calcoaceticus-baumannii NOT DETECTED NOT DETECTED Final   Bacteroides fragilis NOT DETECTED NOT DETECTED Final   Enterobacterales DETECTED (A) NOT DETECTED Final    Comment: Enterobacterales represent a large order of gram negative bacteria, not a single organism. CRITICAL RESULT CALLED TO, READ BACK BY AND VERIFIED WITH: V BRYK,[email or web address]  09/10/23 MK    Enterobacter cloacae complex NOT DETECTED NOT DETECTED Final   Escherichia coli NOT DETECTED NOT DETECTED Final   Klebsiella aerogenes NOT DETECTED NOT DETECTED Final   Klebsiella oxytoca NOT DETECTED NOT DETECTED Final   Klebsiella pneumoniae DETECTED (A) NOT DETECTED Final    Comment: CRITICAL RESULT CALLED TO,  READ BACK BY AND VERIFIED WITH: V BRYK,[email or web address]  09/10/23 MK    Proteus species NOT DETECTED NOT DETECTED Final   Salmonella species NOT DETECTED NOT DETECTED Final   Serratia marcescens NOT DETECTED NOT DETECTED Final   Haemophilus influenzae NOT DETECTED NOT DETECTED Final   Neisseria meningitidis NOT DETECTED NOT DETECTED Final   Pseudomonas aeruginosa NOT DETECTED NOT DETECTED Final   Stenotrophomonas maltophilia NOT DETECTED NOT DETECTED Final   Candida albicans NOT DETECTED NOT DETECTED Final   Candida auris NOT DETECTED NOT DETECTED Final   Candida glabrata NOT DETECTED NOT DETECTED Final   Candida krusei NOT DETECTED NOT DETECTED Final   Candida parapsilosis NOT DETECTED NOT DETECTED Final   Candida tropicalis NOT DETECTED NOT DETECTED Final   Cryptococcus neoformans/gattii NOT DETECTED NOT DETECTED Final   CTX-M ESBL NOT DETECTED NOT DETECTED Final   Carbapenem resistance IMP NOT DETECTED NOT DETECTED Final   Carbapenem resistance KPC NOT DETECTED NOT DETECTED Final   Carbapenem resistance NDM NOT DETECTED NOT DETECTED Final    Carbapenem resist OXA 48 LIKE NOT DETECTED NOT DETECTED Final   Carbapenem resistance VIM NOT DETECTED NOT DETECTED Final    Comment: Performed at [facility], [address] [place], [place] [address]  Urine Culture (for pregnant, neutropenic or urologic patients or patients with an indwelling urinary catheter)     Status: Abnormal   Collection Time: 09/06/23  8:10 PM   Specimen: Urine, Catheterized  Result Value Ref Range Status   Specimen Description   Final    URINE, CATHETERIZED Performed at [facility], [address] [place], [place] [address]    Special Requests   Final    NONE Performed at [facility], [address], [place], [place] [address]    Culture 1,000 COLONIES/mL KLEBSIELLA PNEUMONIAE (A)  Final   Report Status 09/09/2023 FINAL  Final   Organism ID, Bacteria KLEBSIELLA PNEUMONIAE (A)  Final      Susceptibility   Klebsiella pneumoniae - MIC*    AMPICILLIN >=32 RESISTANT Resistant     CEFAZOLIN <=4 SENSITIVE Sensitive     CEFEPIME <=0.12 SENSITIVE Sensitive     CEFTRIAXONE <=0.25 SENSITIVE Sensitive     CIPROFLOXACIN <=0.25 SENSITIVE Sensitive     GENTAMICIN <=1 SENSITIVE Sensitive     IMIPENEM 0.5 SENSITIVE Sensitive     NITROFURANTOIN <=16 SENSITIVE Sensitive     TRIMETH/SULFA <=20 SENSITIVE Sensitive     AMPICILLIN/SULBACTAM 8 SENSITIVE Sensitive     PIP/TAZO 8 SENSITIVE Sensitive ug/mL    * 1,000 COLONIES/mL KLEBSIELLA PNEUMONIAE  MRSA Next Gen by PCR, Nasal     Status: Abnormal   Collection Time: 09/11/23 12:44 PM   Specimen: Nasal Mucosa; Nasal Swab  Result Value Ref Range Status   MRSA by PCR Next Gen DETECTED (A) NOT DETECTED Final    Comment: RESULT CALLED TO, READ BACK BY AND VERIFIED WITH: RN. Shela Commons [phone] @[phone]  FH (NOTE) The GeneXpert MRSA Assay (FDA approved for NASAL specimens only), is one component of a comprehensive MRSA colonization surveillance program. It is not intended to diagnose  MRSA infection nor to guide or monitor treatment for MRSA infections. Test performance is not FDA approved in patients less than 29 years old. Performed at [name], [address] [place], [place] [address]   Remove and replace urinary cath (placed > 5 days) then obtain urine culture from new indwelling urinary catheter.     Status: None  Collection Time: 09/20/23  7:23 PM   Specimen: Urine, Catheterized  Result Value Ref Range Status   Specimen Description URINE, CATHETERIZED  Final   Special Requests NONE  Final   Culture   Final    NO GROWTH Performed at [facility], [address] [place], [place] [address]    Report Status 09/21/2023 FINAL  Final    Labs: CBC: Recent Labs  Lab 09/17/23 0617 09/18/23 0703 09/20/23 0737 09/21/23 0342 09/23/23 0614  WBC 8.8 8.5 8.0 7.0 7.6  NEUTROABS  --  6.2 5.6 4.5 5.0  HGB 8.2* 8.3* 8.5* 8.3* 8.7*  HCT 25.7* 26.3* 26.4* 25.8* 27.2*  MCV 88.3 87.7 87.1 86.6 87.7  PLT 417* 405* 443* 455* 423*   Basic Metabolic Panel: Recent Labs  Lab 09/18/23 0703 09/20/23 0737 09/21/23 0342 09/23/23 0614  NA 137 137 137 137  K 3.6 3.9 4.0 3.8  CL 104 105 103 105  CO2 21* 21* 22 22  GLUCOSE 134* 150* 171* 114*  BUN 11 12 15 13   CREATININE 0.89 0.88 0.94 0.81  CALCIUM 8.2* 8.2* 8.3* 8.2*   Liver Function Tests: No results for input(s): "AST", "ALT", "ALKPHOS", "BILITOT", "PROT", "ALBUMIN" in the last 168 hours. CBG: Recent Labs  Lab 09/22/23 1238 09/22/23 1704 09/22/23 2024 09/23/23 0826 09/23/23 1259  GLUCAP 98 205* 161* 137* 158*    Discharge time spent: greater than 30 minutes.  Signed: Jonah Blue, MD [facility] 09/23/2023

## 2023-09-23 NOTE — Plan of Care (Signed)
 Problem: Education: Goal: Knowledge of General Education information will improve Description: Including pain rating scale, medication(s)/side effects and non-pharmacologic comfort measures Outcome: Completed/Met   Problem: Health Behavior/Discharge Planning: Goal: Ability to manage health-related needs will improve Outcome: Completed/Met   Problem: Clinical Measurements: Goal: Ability to maintain clinical measurements within normal limits will improve Outcome: Completed/Met Goal: Will remain free from infection Outcome: Completed/Met Goal: Diagnostic test results will improve Outcome: Completed/Met Goal: Respiratory complications will improve Outcome: Completed/Met Goal: Cardiovascular complication will be avoided Outcome: Completed/Met   Problem: Activity: Goal: Risk for activity intolerance will decrease Outcome: Completed/Met   Problem: Nutrition: Goal: Adequate nutrition will be maintained Outcome: Completed/Met   Problem: Coping: Goal: Level of anxiety will decrease Outcome: Completed/Met   Problem: Elimination: Goal: Will not experience complications related to bowel motility Outcome: Completed/Met Goal: Will not experience complications related to urinary retention Outcome: Completed/Met   Problem: Pain Managment: Goal: General experience of comfort will improve and/or be controlled Outcome: Completed/Met   Problem: Safety: Goal: Ability to remain free from injury will improve Outcome: Completed/Met   Problem: Skin Integrity: Goal: Risk for impaired skin integrity will decrease Outcome: Completed/Met   Problem: Education: Goal: Ability to describe self-care measures that may prevent or decrease complications (Diabetes Survival Skills Education) will improve Outcome: Completed/Met Goal: Individualized Educational Video(s) Outcome: Completed/Met   Problem: Coping: Goal: Ability to adjust to condition or change in health will improve Outcome:  Completed/Met   Problem: Fluid Volume: Goal: Ability to maintain a balanced intake and output will improve Outcome: Completed/Met   Problem: Health Behavior/Discharge Planning: Goal: Ability to identify and utilize available resources and services will improve Outcome: Completed/Met Goal: Ability to manage health-related needs will improve Outcome: Completed/Met   Problem: Metabolic: Goal: Ability to maintain appropriate glucose levels will improve Outcome: Completed/Met   Problem: Nutritional: Goal: Maintenance of adequate nutrition will improve Outcome: Completed/Met Goal: Progress toward achieving an optimal weight will improve Outcome: Completed/Met   Problem: Skin Integrity: Goal: Risk for impaired skin integrity will decrease Outcome: Completed/Met   Problem: Tissue Perfusion: Goal: Adequacy of tissue perfusion will improve Outcome: Completed/Met   Problem: Education: Goal: Knowledge of General Education information will improve Description: Including pain rating scale, medication(s)/side effects and non-pharmacologic comfort measures Outcome: Completed/Met   Problem: Health Behavior/Discharge Planning: Goal: Ability to manage health-related needs will improve Outcome: Completed/Met   Problem: Clinical Measurements: Goal: Ability to maintain clinical measurements within normal limits will improve Outcome: Completed/Met Goal: Will remain free from infection Outcome: Completed/Met Goal: Diagnostic test results will improve Outcome: Completed/Met Goal: Respiratory complications will improve Outcome: Completed/Met Goal: Cardiovascular complication will be avoided Outcome: Completed/Met   Problem: Activity: Goal: Risk for activity intolerance will decrease Outcome: Completed/Met   Problem: Nutrition: Goal: Adequate nutrition will be maintained Outcome: Completed/Met   Problem: Coping: Goal: Level of anxiety will decrease Outcome: Completed/Met    Problem: Elimination: Goal: Will not experience complications related to bowel motility Outcome: Completed/Met Goal: Will not experience complications related to urinary retention Outcome: Completed/Met   Problem: Pain Managment: Goal: General experience of comfort will improve and/or be controlled Outcome: Completed/Met   Problem: Safety: Goal: Ability to remain free from injury will improve Outcome: Completed/Met   Problem: Skin Integrity: Goal: Risk for impaired skin integrity will decrease Outcome: Completed/Met   Problem: Education: Goal: Knowledge of the prescribed therapeutic regimen will improve Outcome: Completed/Met Goal: Ability to verbalize activity precautions or restrictions will improve Outcome: Completed/Met Goal: Understanding of discharge needs will improve Outcome: Completed/Met  Problem: Activity: Goal: Ability to perform//tolerate increased activity and mobilize with assistive devices will improve Outcome: Completed/Met

## 2023-09-23 NOTE — Progress Notes (Addendum)
 Report called to [facility] RN at [facility] in [employer]. She had no further questions regarding patient.   Patient picked up by transport around 1634. All belongings sent with patient. A&Ox4, VSS. Foley catheter in place.

## 2023-09-23 NOTE — Progress Notes (Signed)
 Patient ID: Henry Bryant, male   DOB: 1961-04-17, 63 y.o.   MRN: [id number] Patient is here in follow-up status post bilateral first ray amputations.  Examination there are mild ischemic changes along the incision line of the left foot.  No cellulitis no drainage the plantar aspect of the foot is stable.  Examination of the right foot patient has ischemic changes to the third toe and a superficial ischemic eschar over the dorsum of the foot and ankle.  The plantar aspect of the foot is stable the incision is well-approximated.  Anticipate discharge to skilled nursing.  Patient may be weightbearing as tolerated on both lower extremities.

## 2023-09-30 ENCOUNTER — Encounter

## 2023-09-30 ENCOUNTER — Telehealth: Payer: Self-pay | Admitting: Urology
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
## 2023-09-30 NOTE — Telephone Encounter (Signed)
 Patient is made aware that another catheter order will be sent today and he should received in a few days. Spoke to [place] from [facility], whom state's that she will send in another order.

## 2023-10-06 ENCOUNTER — Ambulatory Visit (HOSPITAL_BASED_OUTPATIENT_CLINIC_OR_DEPARTMENT_OTHER): Admitting: Internal Medicine

## 2023-10-14 ENCOUNTER — Encounter: Admitting: Orthopedic Surgery

## 2023-10-22 ENCOUNTER — Ambulatory Visit (HOSPITAL_COMMUNITY): Admitting: Physical Therapy

## 2023-10-22 ENCOUNTER — Telehealth (HOSPITAL_COMMUNITY): Payer: Self-pay | Admitting: Physical Therapy

## 2023-10-22 NOTE — Telephone Encounter (Signed)
 Called pt re missed appointment.  PT states that this was the first time he has heard anything about his appointment.  He will be here on Friday.  Leodis Rainwater, PT CLT [phone]
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
## 2023-10-24 ENCOUNTER — Ambulatory Visit (HOSPITAL_COMMUNITY): Admitting: Physical Therapy
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
## 2023-10-27 ENCOUNTER — Ambulatory Visit (HOSPITAL_COMMUNITY): Admitting: Physical Therapy

## 2023-10-29 ENCOUNTER — Ambulatory Visit (HOSPITAL_COMMUNITY)

## 2023-10-31 ENCOUNTER — Ambulatory Visit (HOSPITAL_COMMUNITY)

## 2023-11-04 ENCOUNTER — Ambulatory Visit (HOSPITAL_COMMUNITY)

## 2023-11-06 ENCOUNTER — Ambulatory Visit (HOSPITAL_COMMUNITY): Admitting: Physical Therapy

## 2024-02-10 ENCOUNTER — Ambulatory Visit (INDEPENDENT_AMBULATORY_CARE_PROVIDER_SITE_OTHER): Admitting: Gastroenterology

## 2024-02-10 ENCOUNTER — Telehealth: Payer: Self-pay | Admitting: *Deleted

## 2024-02-10 ENCOUNTER — Encounter (INDEPENDENT_AMBULATORY_CARE_PROVIDER_SITE_OTHER): Payer: Self-pay | Admitting: Gastroenterology

## 2024-02-10 VITALS — BP 152/68 | HR 52 | Temp 97.8°F | Ht 70.0 in | Wt 150.0 lb

## 2024-02-10 DIAGNOSIS — K5904 Chronic idiopathic constipation: Secondary | ICD-10-CM | POA: Insufficient documentation

## 2024-02-10 DIAGNOSIS — R194 Change in bowel habit: Secondary | ICD-10-CM

## 2024-02-10 DIAGNOSIS — D649 Anemia, unspecified: Secondary | ICD-10-CM | POA: Diagnosis not present

## 2024-02-10 DIAGNOSIS — R197 Diarrhea, unspecified: Secondary | ICD-10-CM | POA: Insufficient documentation

## 2024-02-10 MED ORDER — PSYLLIUM 58.6 % PO PACK: 1.0000 | PACK | Freq: Two times a day (BID) | ORAL | 2 refills | Status: AC

## 2024-02-10 MED ORDER — POLYETHYLENE GLYCOL 3350 17 G PO PACK: 17.0000 g | PACK | Freq: Two times a day (BID) | ORAL | 0 refills | Status: AC

## 2024-02-10 NOTE — Patient Instructions (Signed)
 It was very nice to meet you today, as dicussed with will plan for the following :  1) Ensure adequate fluid intake: Aim for 8 glasses of water daily. Follow a high fiber diet: Include foods such as dates, prunes, pears, and kiwi. Take Miralax  twice a day for the first week, then reduce to once daily thereafter. Use Metamucil twice a day.  2) Labwork  3) Upper endoscopy and colonoscopy

## 2024-02-10 NOTE — Telephone Encounter (Signed)
 Clearance faxed to [facility] cardiology at [facility]
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
## 2024-02-10 NOTE — Telephone Encounter (Signed)
  Request for patient to stop medication prior to procedure or is needing cleareance  02/10/24  Henry Bryant 04-29-1961  What type of surgery is being performed? Colonoscopy/EGD   When is surgery scheduled? TBD  What type of clearance is required (medical or pharmacy to hold medication or both? medication  Are there any medications that need to be held prior to surgery and how long? Eliquis  x 2 days  Name of physician performing surgery?  Dr.Ahmed Raynaldo Gastroenterology at [facility] Phone: [phone] Fax: [phone]  Anethesia type (none, local, MAC, general)? MAC

## 2024-02-18 ENCOUNTER — Telehealth (INDEPENDENT_AMBULATORY_CARE_PROVIDER_SITE_OTHER): Payer: Self-pay | Admitting: Gastroenterology

## 2024-02-18 MED ORDER — FERROUS GLUCONATE 324 (38 FE) MG PO TABS
324.0000 mg | ORAL_TABLET | ORAL | Status: AC
Start: 2024-02-18 — End: 2024-04-18

## 2024-02-18 MED ORDER — VITAMIN B-12 1000 MCG PO TABS
1000.0000 ug | ORAL_TABLET | Freq: Every day | ORAL | 1 refills | Status: AC
Start: 2024-02-18 — End: 2024-04-18

## 2024-02-18 NOTE — Telephone Encounter (Signed)
 [facility] contacted but no answer when transferred to nurse. Contact mobile number for pt and spoke to pt. Patient will be released for rehab on Friday. Advised pt of provider recommendations, pt verbalized understanding. Pt will call back to schedule procedures once he is discharged from rehab

## 2024-02-18 NOTE — Telephone Encounter (Signed)
 Recent labwork with component of iron deficiency and vitmain b12 deficiency  Will prescribe vitamin B12 and Iron (after the procedure). Bidirectional endoscopy as discussed

## 2024-02-23 NOTE — Telephone Encounter (Signed)
 Refaxed clearance.

## 2024-02-23 NOTE — Telephone Encounter (Signed)
 April from [facility] cardiology left vm stating that pt has an appointment next week to be seen by cardiology and clearance will be addressed at that visit.  Call [phone] if any questions.

## 2024-03-02 ENCOUNTER — Ambulatory Visit: Admitting: Urology

## 2024-03-04 NOTE — Telephone Encounter (Signed)
 Faxed clearance for 2nd time

## 2024-03-15 NOTE — Telephone Encounter (Signed)
 Spoke to April from [facility] cardiology and she says that pt cancelled his appointment with cardiology.

## 2024-04-15 NOTE — Telephone Encounter (Signed)
 Called and spoke with April at [facility] Cardiology. Pt has not called to reschedule his appointment with cardiology and she states she has tried to call him multiple times to get him rescheduled. Pt has an appointment in our office on 06/14/24. FYI

## 2024-04-15 NOTE — Telephone Encounter (Signed)
 So patient is not seeing cardiology despite the office reaching out to him ? Patient does not want to have procedure done ?

## 2024-04-16 NOTE — Telephone Encounter (Signed)
 LMOVM to return call.

## 2024-04-21 ENCOUNTER — Encounter: Payer: Self-pay | Admitting: *Deleted

## 2024-04-22 ENCOUNTER — Encounter: Payer: Self-pay | Admitting: *Deleted

## 2024-04-22 NOTE — Telephone Encounter (Signed)
LMOVM to return call. Letter mailed

## 2024-05-10 ENCOUNTER — Encounter: Payer: Self-pay | Admitting: Radiology
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
## 2024-05-25 ENCOUNTER — Ambulatory Visit: Admitting: Urology

## 2024-05-25 DIAGNOSIS — R339 Retention of urine, unspecified: Secondary | ICD-10-CM

## 2024-05-25 DIAGNOSIS — N138 Other obstructive and reflux uropathy: Secondary | ICD-10-CM

## 2024-06-14 ENCOUNTER — Ambulatory Visit (INDEPENDENT_AMBULATORY_CARE_PROVIDER_SITE_OTHER): Admitting: Gastroenterology

## 2024-08-13 ENCOUNTER — Telehealth: Payer: Self-pay

## 2024-08-13 NOTE — Telephone Encounter (Signed)
 Clinical notes and Rx faxed, sign by MD.

## 2024-09-17 ENCOUNTER — Ambulatory Visit: Admitting: Urology
